# Patient Record
Sex: Female | Born: 1945 | Race: White | Hispanic: No | Smoking: Never smoker
Health system: Southern US, Community
[De-identification: ages and names within clinical notes are randomized; demographics above are authoritative.]

## PROBLEM LIST (undated history)

## (undated) DIAGNOSIS — R9389 Abnormal findings on diagnostic imaging of other specified body structures: Secondary | ICD-10-CM

## (undated) DIAGNOSIS — Z86018 Personal history of other benign neoplasm: Secondary | ICD-10-CM

## (undated) DIAGNOSIS — Z8719 Personal history of other diseases of the digestive system: Secondary | ICD-10-CM

## (undated) DIAGNOSIS — D0510 Intraductal carcinoma in situ of unspecified breast: Secondary | ICD-10-CM

## (undated) DIAGNOSIS — M810 Age-related osteoporosis without current pathological fracture: Secondary | ICD-10-CM

## (undated) DIAGNOSIS — C221 Intrahepatic bile duct carcinoma: Secondary | ICD-10-CM

## (undated) DIAGNOSIS — N189 Chronic kidney disease, unspecified: Secondary | ICD-10-CM

## (undated) DIAGNOSIS — K2270 Barrett's esophagus without dysplasia: Secondary | ICD-10-CM

## (undated) HISTORY — PX: DIALYSIS/PERMA CATHETER REPAIR: CATH118293

## (undated) HISTORY — DX: Personal history of other benign neoplasm: Z86.018

## (undated) HISTORY — DX: Intraductal carcinoma in situ of unspecified breast: D05.10

## (undated) HISTORY — DX: Age-related osteoporosis without current pathological fracture: M81.0

## (undated) HISTORY — PX: MASTECTOMY PARTIAL / LUMPECTOMY: SUR851

## (undated) HISTORY — DX: Chronic kidney disease, unspecified: N18.9

## (undated) HISTORY — DX: Abnormal findings on diagnostic imaging of other specified body structures: R93.89

## (undated) HISTORY — DX: Barrett's esophagus without dysplasia: K22.70

## (undated) HISTORY — PX: LAPAROSCOPIC PARTIAL HEPATECTOMY: SHX5909

## (undated) HISTORY — DX: Personal history of other diseases of the digestive system: Z87.19

## (undated) HISTORY — PX: CHOLECYSTECTOMY: SHX55

## (undated) HISTORY — DX: Intrahepatic bile duct carcinoma: C22.1

## (undated) NOTE — *Deleted (*Deleted)
[facility]  [address], [address] [place], [place] [address] Phone: [phone]  Fax: [phone]   Clinic Day:  06/15/2020  Referring physician: Garald Balding*  Chief Complaint: Connie Olsen is a 71 y.o. female with a history of stage II gallbladder cancer, left breast DCIS, and end-stage renal disease on dialysis who is seen for 1 year assessment.  HPI:  The patient was last seen in the hematology clinic on 06/16/2019 for new patient assessment. At that time, she had "good and bad days".  She noted a 30 pound unintentional weight loss over the past 1.5 years. She was eating less.  She had off and on nausea and "some" emesis.  She had alternating constipation and diarrhea which she attributed to IBS. She continued anastrozole. She preferred to be followed by [facility] oncology and only have her port flushed in [place].  Chest, abdomen, and pelvis CT with contrast at [facility] on 06/30/2019 revealed no evidence of metastatic disease in the chest, abdomen, or pelvis. There was similar appearance of low-attenuation in the left hepatic lobe favoring focal fatty infiltration. The aortocaval lymph node was less conspicuous on this study.   The patient saw Dr. Harden Mo on 12/31/2019 for a follow up regarding her history of DCIS. Bilateral diagnostic mammogram on 12/31/2019 revealed no evidence of malignancy. She continued anastrozole and will hit the 5 year mark in 08/2020. Follow up was scheduled for 08/2020.  Bone density on 03/22/2020 revealed osteoporosis with a T score of -2.7 at the right femur neck.  The patient saw Dr. Mariah Milling, on 06/07/2020 for a follow up regarding her history of gallbladder cancer. She had no new GI complaints. Chest, abdomen, and pelvis CT with contrast on 06/07/2020 revealed no metastatic disease. Follow up was planned for 1 year.  CA19-9 was 140 on 12/30/2018 and 167 on 06/07/2020.  CEA was 8.4 on 06/30/2019, 6.1 on 12/01/2019, and  7.5 on 06/07/2020.  During the interim, ***   Past Medical History:  Diagnosis Date  . Barrett's esophagus without dysplasia   . Cholangiocarcinoma ([place])   . Chronic kidney disease   . 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 Endometrial thickening on ultra sound 04/19/2015  . History of ulcerative colitis   . History of uterine fibroid   . Osteoporosis     Past Surgical History:  Procedure Laterality Date  . CHOLECYSTECTOMY    . colonoscopy  05/08/2016  . LAPAROSCOPIC PARTIAL HEPATECTOMY    . MASTECTOMY PARTIAL / LUMPECTOMY    . UPPER GI ENDOSCOPY  05/08/2016    Family History  Problem Relation Age of Onset  . Breast cancer Mother     Social History:  reports that she has never smoked. She has never used smokeless tobacco. She reports that she does not drink alcohol and does not use drugs. She is from [place]. She has had multiple jobs. Her husband worked in [employer] so they moved around a lot.  She lives in [place].  The patient is alone*** today.  Allergies:  Allergies  Allergen Reactions  . Aspirin Other (See Comments)    Ulcerative colitis: UNABLE TO TOLERATE IF NOT ENTERIC COATED Ulcerative colitis: UNABLE TO TOLERATE IF NOT ENTERIC COATED  . Brassica Napus Seed Oil Other (See Comments)  . Naltrexone Other (See Comments)  . Librax  [Chlordiazepoxide-Clidinium] Anxiety    agitation agitation  . Povidone Iodine Itching and Rash  . Povidone-Iodine Itching and Rash  . Tape Itching  Current Medications: Current Outpatient Medications  Medication Sig Dispense Refill  . acetaminophen (TYLENOL) 500 MG tablet Take 500 mg by mouth every 6 (six) hours as needed for moderate pain.     Marland Kitchen amitriptyline (ELAVIL) 10 MG tablet Take 10 mg by mouth at bedtime.     Marland Kitchen amoxicillin (AMOXIL) 500 MG tablet TAKE 4 TABLETS BY MOUTH ONCE FOR 1 DOSE. TAKE 30 60 MINUTES PRIOR TO DENTAL PROCEDURE    . anastrozole (ARIMIDEX) 1 MG tablet Take 1 mg by mouth daily.    Marland Kitchen  aspirin EC 81 MG tablet Take 81 mg by mouth daily.     . B Complex-C-Folic Acid (DIALYVITE TABLET) TABS Take 1 tablet by mouth daily.     . Baclofen 5 MG TABS Take 2.5 mg by mouth 2 (two) times daily as needed (Spasm, back pain). 10 tablet 0  . Bismuth Subsalicylate [id number] SUSP Take 15 mLs by mouth daily. As directed as needed    . Darbepoetin Alfa (ARANESP) 100 MCG/0.5ML SOSY injection Inject 100 mcg into the vein every 7 (seven) days.     . diazepam (VALIUM) 10 MG tablet TAKE 1 TABLET BY MOUTH 1 HOUR PRIOR TO DENTAL TREATMENT    . ferric citrate (AURYXIA) 1 GM 210 MG(Fe) tablet Take 420 mg by mouth 3 (three) times daily.     . folic acid-vitamin b complex-vitamin c-selenium-zinc (DIALYVITE) 3 MG TABS tablet Take 1 tablet by mouth daily.    . Heparin Lock Flush (HEPARIN FLUSH, PORCINE,) 100 UNIT/ML injection 100U/ml Flush port with 42ml every 30 days.    Marland Kitchen lidocaine-prilocaine (EMLA) cream Apply to access before dialysis    . metoprolol tartrate (LOPRESSOR) 25 MG tablet 12.5 mg 2 (two) times daily.   1  . midodrine (PROAMATINE) 10 MG tablet Take by mouth.    Marland Kitchen omeprazole (PRILOSEC) 20 MG capsule TAKE ONE CAPSULE BY MOUTH EVERY DAY 30-45 MINUTES BEFORE BREAKFAST  8  . SENSIPAR 60 MG tablet TAKE 1 TABLET BY MOUTH ONCE A DAY WITH MEALS  11  . sodium chloride 0.9 % injection 17ml of NS flush into port once every 30 days. Follow with heparin flush    . traMADol (ULTRAM) 50 MG tablet Take 1 tablet (50 mg total) by mouth every 12 (twelve) hours as needed. 10 tablet 0   No current facility-administered medications for this visit.    Review of Systems  Constitutional: Positive for weight loss (unintentional 30 pounds over 1.5 years;  baseline 168-170 pounds). Negative for chills, diaphoresis, fever and malaise/fatigue.       She describes "good and bad days".  HENT: Negative for congestion, ear pain, hearing loss, nosebleeds, sinus pain and sore throat.   Eyes: Negative for blurred vision and  double vision.  Respiratory: Positive for cough (constant) and sputum production (phlegm). Negative for hemoptysis and shortness of breath.   Cardiovascular: Positive for chest pain (off and on). Negative for palpitations and leg swelling.  Gastrointestinal: Positive for diarrhea (on and off), nausea (on and off) and vomiting. Negative for abdominal pain, blood in stool, constipation (on and off) and melena.       Eating less. IBS; ulcerative colitis. Barrett's esophagus. Dysphagia on surveillance program.  Genitourinary: Negative for dysuria, hematuria and urgency.       Began dialysis 11/2012.  Musculoskeletal: Positive for back pain (spasms) and joint pain (bilateral knees, hands; arthritis). Negative for myalgias and neck pain (herniated disc).  Skin: Negative for itching and rash.  Neurological:  Positive for sensory change (numbness in fingers and hands) and headaches (off and on). Negative for dizziness, tingling, speech change, focal weakness and weakness.       Ambulate by cane or walker.  Endo/Heme/Allergies: Does not bruise/bleed easily.       Type II diabetes.  Psychiatric/Behavioral: Negative.  Negative for depression and memory loss. The patient is not nervous/anxious and does not have insomnia.   All other systems reviewed and are negative.  Performance status (ECOG): 2***  Vitals There were no vitals taken for this visit.   Physical Exam Vitals and nursing note reviewed.  Constitutional:      General: She is not in acute distress.    Appearance: She is well-developed. She is not diaphoretic.     Comments: Patient needs assistance on and off exam room table.  She has a rolling walker by her side.  HENT:     Head: Normocephalic and atraumatic.     Mouth/Throat:     Pharynx: No oropharyngeal exudate.  Eyes:     General: No scleral icterus.    Conjunctiva/sclera: Conjunctivae normal.     Pupils: Pupils are equal, round, and reactive to light.     Comments: Blue eyes.   Neck:     Vascular: No JVD.  Cardiovascular:     Rate and Rhythm: Normal rate and regular rhythm.     Heart sounds: Normal heart sounds. No murmur heard.   Pulmonary:     Effort: Pulmonary effort is normal. No respiratory distress.     Breath sounds: Normal breath sounds. No wheezing or rales.  Chest:     Chest wall: No tenderness.  Abdominal:     General: Bowel sounds are normal. There is no distension.     Palpations: Abdomen is soft. There is no mass.     Tenderness: There is abdominal tenderness. There is no guarding or rebound.  Musculoskeletal:        General: Normal range of motion.     Cervical back: Normal range of motion and neck supple.     Lumbar back: Tenderness (paravertebral) present.  Lymphadenopathy:     Cervical: No cervical adenopathy.     Upper Body:     Right upper body: No supraclavicular adenopathy.     Left upper body: No supraclavicular adenopathy.     Lower Body: No right inguinal adenopathy. No left inguinal adenopathy.  Skin:    General: Skin is warm and dry.     Coloration: Skin is not pale.     Findings: No rash.  Neurological:     Mental Status: She is alert and oriented to person, place, and time.  Psychiatric:        Behavior: Behavior normal.        Thought Content: Thought content normal.        Judgment: Judgment normal.     No visits with results within 3 Day(s) from this visit.  Latest known visit with results is:  Office Visit on 11/14/2017  Component Date Value Ref Range Status  . Atopobium vaginae 11/14/2017 Low - 0  Score Final  . BVAB 2 11/14/2017 Low - 0  Score Final  . Megasphaera 1 11/14/2017 Low - 0  Score Final   Comment: Calculate total score by adding the 3 individual bacterial vaginosis (BV) marker scores together.  Total score is interpreted as follows: Total score 0-1: Indicates the absence of BV. Total score   2: Indeterminate for BV. Additional clinical  data should be evaluated to establish a                   diagnosis. Total score 3-6: Indicates the presence of BV. This test was developed and its performance characteristics determined by [facility].  It has not been cleared or approved by the Food and Drug Administration.  The FDA has determined that such clearance or approval is not necessary.   . Candida albicans, NAA 11/14/2017 Negative  Negative Final  . Candida glabrata, NAA 11/14/2017 Negative  Negative Final   Comment: This test was developed and its performance characteristics determined by [facility].  It has not been cleared or approved by the Food and Drug Administration.  The FDA has determined that such clearance or approval is not necessary.   Dalbert Batman vag by NAA 11/14/2017 Negative  Negative Final    Assessment:  Connie Olsen is a 74 y.o. female with a history of stage II gallbladder cancer, left breast DCIS, and ESRD on dialysis.   She has a history of gallbladder cancer s/p laparoscopic cholecystectomy and liver wedge biopsy by Dr Mariah Milling at [facility] on 04/15/2014.  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  Tumor invaded perimuscular connective tissue with no extension beyond the serosa or into the liver.  Margins were uninvolved (closest 0.5 mm).  There is no lymphovascular or perineural invasion.  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  One lymph node was negative.  Pathologic stage was pT2 pN0.   Abdomen and pelvis CT on 12/30/2018 showed no definite metastatic disease within the chest, abdomen, or pelvis. There was minimally increased conspicuity of a hypodense left hepatic lesion, favor benign in etiology such as focal fat given relative stability since 2017. Attention on follow up imaging was recommended.  There was no significant change in the mildy prominent aortocaval lymph node. Chest, abdomen, and pelvis CT with contrast at [facility] on 06/30/2019  revealed no evidence of metastatic disease. There was similar appearance of low-attenuation in the left hepatic lobe favoring focal fatty infiltration. The aortocaval lymph node was less conspicuous.  Chest, abdomen, and pelvis CT with contrast at [facility] on 06/07/2020 revealed no metastatic disease.  CA19-9 has been followed: 87 on 10/15/2014, 165 on 01/21/2015, 92 on 03/22/2015, 131 on 09/20/2015, 168 on 03/20/2016, 89 on 09/18/2016, 143 on 09/24/2017, 495 on 09/16/2018, 140 on 12/30/2018, and 167 on 06/07/2020.  CEA was 5.5 on 10/15/2014, 4.7 on 01/21/2015, 5.5 on 03/22/2015, 8.4 on 06/30/2019, 6.1 on 12/01/2019, and 7.5 on 06/07/2020.  She has a history of DCIS left breast (11/2014) s/p multiple excisions with + margins.  DCIS was ER and PR positive.  She declined further surgery or radiation.  She remains on Armidex.  She is followed by Dr. Deitra Mayo at [facility].  Bilateral mammogram on 08/12/2018 showed no mammographic evidence of reccurrent malignancy. Bilateral diagnositc mammogram on 12/31/2019 revealed no evidence of malignancy.  She is on dialysis 3 times a week.   She has had recurrent episodes of AV-fistula/graft thrombosis.  She has been seen in the hematology clinic by Dr. Dyann Kief.   Thrombophilia work-up was not recommended.  Anticardiolipin antibody IgM was > 150 on 11/28/2016 and 06/06/2017.  Beta-2 glycoprotein was > 150 on 11/28/2016 and 06/06/2017.  Lupus anticoagulant panel was + on 12/24/2016 and 06/06/2017.   Bone density on 11/11/2017 revealed osteopenia with a T score of -1.40 at the right  neck femur and osteoporosis with a T-score of -3.3 in the left radius. Bone density on 03/22/2020 revealed osteoporosis with a T score of -2.7 at the right femur neck.  She is not a candidate for Zometa given her ESRD.  She has IBS.  Last colonoscopy was on 05/08/2016.  She has Barrett's esophagus. Last EGD was on 05/23/2017.  Echo on 03/27/2019 revealed ejection fraction > 55%.   The patient  received the COVID-19 vaccine.  Symptomatically, ***  Plan: 1.   Review labs   2.   Stage II gallbladder cancer   Review diagnosis and pathology.  Review interim scans.  Patient follows with Dr Mariah Milling at [facility]. 3.   Left breast DCIS  Review initial diagnosis and multiple surgeries with positive margins.  Patient declined radiation.  Bilateral mammogram on 08/12/2018 revealed no recurrence.   She continues Arimidex.  She is followed by Dr Deitra Mayo at [facility]. 4.   Recurrent AV-fistula/graft thrombosis  Patient with + lupus anticoagulant, anti-cardiolipin antibodies, beta-2 glycoprotein antibodies.  Continue aspirin.  Discuss follow-up with Dr. Dyann Kief. 5.   Port-a cath maintenance  Patient wishes only to have port maintenance in this clinic. 6.   RTC every 6 weeks x 1 year for port flush 7.   RTC in 1 year for MD assessment, +/- labs, and port flush.  I discussed the assessment and treatment plan with the patient.  The patient was provided an opportunity to ask questions and all were answered.  The patient agreed with the plan and demonstrated an understanding of the instructions.  The patient was advised to call back if the symptoms worsen or if the condition fails to improve as anticipated.  I provided *** minutes of face-to-face time during this this encounter and > 50% was spent counseling as documented under my assessment and plan.  Omaree Fuqua C. Mike Gip, MD, PhD    06/15/2020, 2:18 PM  I, Mirian Mo Tufford, am acting as Education administrator for [employer]. Mike Gip, MD, PhD.  I, Arlen Dupuis C. Mike Gip, MD, have reviewed the above documentation for accuracy and completeness, and I agree with the above.

---

## 2014-02-04 DIAGNOSIS — I4891 Unspecified atrial fibrillation: Secondary | ICD-10-CM | POA: Insufficient documentation

## 2014-02-04 DIAGNOSIS — N186 End stage renal disease: Secondary | ICD-10-CM | POA: Insufficient documentation

## 2014-02-09 DIAGNOSIS — C23 Malignant neoplasm of gallbladder: Secondary | ICD-10-CM | POA: Insufficient documentation

## 2014-11-02 DIAGNOSIS — D0510 Intraductal carcinoma in situ of unspecified breast: Secondary | ICD-10-CM

## 2014-11-02 HISTORY — DX: Intraductal carcinoma in situ of unspecified breast: D05.10

## 2014-11-16 DIAGNOSIS — I10 Essential (primary) hypertension: Secondary | ICD-10-CM | POA: Insufficient documentation

## 2014-11-16 DIAGNOSIS — Z87898 Personal history of other specified conditions: Secondary | ICD-10-CM | POA: Insufficient documentation

## 2014-11-16 DIAGNOSIS — E669 Obesity, unspecified: Secondary | ICD-10-CM | POA: Insufficient documentation

## 2014-11-16 DIAGNOSIS — Z4802 Encounter for removal of sutures: Secondary | ICD-10-CM | POA: Insufficient documentation

## 2015-02-12 DIAGNOSIS — D509 Iron deficiency anemia, unspecified: Secondary | ICD-10-CM | POA: Insufficient documentation

## 2015-02-12 DIAGNOSIS — D631 Anemia in chronic kidney disease: Secondary | ICD-10-CM | POA: Insufficient documentation

## 2015-02-12 DIAGNOSIS — E1129 Type 2 diabetes mellitus with other diabetic kidney complication: Secondary | ICD-10-CM | POA: Insufficient documentation

## 2015-02-12 DIAGNOSIS — D689 Coagulation defect, unspecified: Secondary | ICD-10-CM | POA: Insufficient documentation

## 2015-02-12 DIAGNOSIS — R188 Other ascites: Secondary | ICD-10-CM | POA: Insufficient documentation

## 2015-02-12 DIAGNOSIS — N2581 Secondary hyperparathyroidism of renal origin: Secondary | ICD-10-CM | POA: Insufficient documentation

## 2015-02-12 DIAGNOSIS — E876 Hypokalemia: Secondary | ICD-10-CM | POA: Insufficient documentation

## 2015-02-12 DIAGNOSIS — R7881 Bacteremia: Secondary | ICD-10-CM | POA: Insufficient documentation

## 2015-03-04 DIAGNOSIS — I878 Other specified disorders of veins: Secondary | ICD-10-CM | POA: Insufficient documentation

## 2015-03-08 DIAGNOSIS — Z8742 Personal history of other diseases of the female genital tract: Secondary | ICD-10-CM | POA: Insufficient documentation

## 2015-03-08 DIAGNOSIS — Z8719 Personal history of other diseases of the digestive system: Secondary | ICD-10-CM | POA: Insufficient documentation

## 2015-03-31 ENCOUNTER — Other Ambulatory Visit: Payer: Self-pay | Admitting: Family Medicine

## 2015-03-31 DIAGNOSIS — N859 Noninflammatory disorder of uterus, unspecified: Secondary | ICD-10-CM

## 2015-03-31 DIAGNOSIS — N950 Postmenopausal bleeding: Secondary | ICD-10-CM

## 2015-03-31 DIAGNOSIS — N949 Unspecified condition associated with female genital organs and menstrual cycle: Secondary | ICD-10-CM

## 2015-04-04 DIAGNOSIS — R1084 Generalized abdominal pain: Secondary | ICD-10-CM | POA: Insufficient documentation

## 2015-04-05 ENCOUNTER — Ambulatory Visit
Admission: RE | Admit: 2015-04-05 | Discharge: 2015-04-05 | Disposition: A | Discharge status: Home / Self Care | Payer: Medicare Other | Source: Ambulatory Visit | Attending: Family Medicine | Admitting: Family Medicine

## 2015-04-05 DIAGNOSIS — N949 Unspecified condition associated with female genital organs and menstrual cycle: Secondary | ICD-10-CM | POA: Insufficient documentation

## 2015-04-05 DIAGNOSIS — D259 Leiomyoma of uterus, unspecified: Secondary | ICD-10-CM | POA: Diagnosis not present

## 2015-04-05 DIAGNOSIS — N950 Postmenopausal bleeding: Secondary | ICD-10-CM

## 2015-04-05 DIAGNOSIS — R938 Abnormal findings on diagnostic imaging of other specified body structures: Secondary | ICD-10-CM | POA: Diagnosis not present

## 2015-04-05 DIAGNOSIS — N859 Noninflammatory disorder of uterus, unspecified: Secondary | ICD-10-CM | POA: Diagnosis present

## 2015-04-19 ENCOUNTER — Inpatient Hospital Stay: Payer: Medicare Other | Attending: Family Medicine

## 2015-04-19 ENCOUNTER — Inpatient Hospital Stay: Payer: Medicare Other | Attending: Family Medicine | Admitting: Family Medicine

## 2015-04-19 ENCOUNTER — Encounter: Payer: Self-pay | Admitting: Family Medicine

## 2015-04-19 ENCOUNTER — Other Ambulatory Visit: Payer: Self-pay | Admitting: Family Medicine

## 2015-04-19 VITALS — BP 91/54 | HR 82 | Temp 98.0°F | Resp 16 | Ht 61.02 in | Wt 204.6 lb

## 2015-04-19 DIAGNOSIS — Z8509 Personal history of malignant neoplasm of other digestive organs: Secondary | ICD-10-CM

## 2015-04-19 DIAGNOSIS — Z992 Dependence on renal dialysis: Secondary | ICD-10-CM | POA: Insufficient documentation

## 2015-04-19 DIAGNOSIS — N950 Postmenopausal bleeding: Secondary | ICD-10-CM | POA: Diagnosis not present

## 2015-04-19 DIAGNOSIS — I4891 Unspecified atrial fibrillation: Secondary | ICD-10-CM | POA: Diagnosis not present

## 2015-04-19 DIAGNOSIS — R5383 Other fatigue: Secondary | ICD-10-CM | POA: Diagnosis not present

## 2015-04-19 DIAGNOSIS — R531 Weakness: Secondary | ICD-10-CM | POA: Diagnosis not present

## 2015-04-19 DIAGNOSIS — I872 Venous insufficiency (chronic) (peripheral): Secondary | ICD-10-CM

## 2015-04-19 DIAGNOSIS — Z7982 Long term (current) use of aspirin: Secondary | ICD-10-CM

## 2015-04-19 DIAGNOSIS — Z9221 Personal history of antineoplastic chemotherapy: Secondary | ICD-10-CM

## 2015-04-19 DIAGNOSIS — N186 End stage renal disease: Secondary | ICD-10-CM | POA: Diagnosis not present

## 2015-04-19 DIAGNOSIS — R938 Abnormal findings on diagnostic imaging of other specified body structures: Secondary | ICD-10-CM | POA: Diagnosis not present

## 2015-04-19 DIAGNOSIS — Z452 Encounter for adjustment and management of vascular access device: Secondary | ICD-10-CM | POA: Diagnosis not present

## 2015-04-19 DIAGNOSIS — Z79899 Other long term (current) drug therapy: Secondary | ICD-10-CM | POA: Diagnosis not present

## 2015-04-19 DIAGNOSIS — D0512 Intraductal carcinoma in situ of left breast: Secondary | ICD-10-CM

## 2015-04-19 DIAGNOSIS — R9389 Abnormal findings on diagnostic imaging of other specified body structures: Secondary | ICD-10-CM

## 2015-04-19 DIAGNOSIS — Z9049 Acquired absence of other specified parts of digestive tract: Secondary | ICD-10-CM | POA: Diagnosis not present

## 2015-04-19 HISTORY — DX: Abnormal findings on diagnostic imaging of other specified body structures: R93.89

## 2015-04-19 MED ORDER — SODIUM CHLORIDE 0.9 % IJ SOLN
10.0000 mL | INTRAMUSCULAR | Status: DC | PRN
  Administered 2015-04-19: 10 mL via INTRAVENOUS
  Filled 2015-04-19: qty 10

## 2015-04-19 MED ORDER — HEPARIN SOD (PORK) LOCK FLUSH 100 UNIT/ML IV SOLN
500.0000 [IU] | Freq: Once | INTRAVENOUS | Status: DC
  Filled 2015-04-19: qty 5

## 2015-04-19 NOTE — Progress Notes (Signed)
Connie Olsen  [phone]  Fax:([phone]) [place] DOB: March 03, 1946  MR#: [id number]  [id number]  Patient Care Team: Gearldine Shown, DO as PCP - General (Family Medicine)  CHIEF COMPLAINT:  Chief Complaint  Patient presents with  . Follow-up    Pt is here for power port maintenance...   She has a significant medical history including DCIS of the left breast, cholangiocarcinoma, and most recently endometrial thickening.  Patient also was significant history of end-stage renal disease on hemodialysis  Monday Wednesday and Friday, atrial fibrillation , poor venous access.  INTERVAL HISTORY:  Patient is here to establish care for regular port maintenance at your local facility.  Patient was originally diagnosed with cholangiocarcinoma approximately July 2015. She underwent a laparoscopic cholecystectomy 04/15/2014 followed by a radical cholecystectomy with portal LAD and partial hepatectomy in September 2015.  She follows with Dr. Mariah Milling. Initial pathologies were thought to be negative for malignancy however final pathology demonstrated that T2 N0 well differentiated adenocarcinoma with negative margins. Patient was also recently diagnosed with DCIS of the left breast and is being followed by Dr. Cena Benton. Area of the left breast has had excision 2 with recurrence. She is due for a third surgical excision with Dr. Cena Benton  In the near future. She was also seen by her primary care provider for postmenopausal vaginal bleeding. Ultrasounds have been performed and patient was noted to have endometrial thickening. She has been referred to [place]. She is here today to establish care for her port maintenance as it is difficult for her to travel to  [facility] as frequently as needed for Port-A-Cath maintenance.  REVIEW OF SYSTEMS:   Review of Systems  Constitutional: Positive for malaise/fatigue. Negative for fever, chills, weight loss and  diaphoresis.  HENT: Negative for congestion, ear discharge, ear pain, hearing loss, nosebleeds, sore throat and tinnitus.   Eyes: Negative for blurred vision, double vision, photophobia, pain, discharge and redness.  Respiratory: Negative for cough, hemoptysis, sputum production, shortness of breath, wheezing and stridor.   Cardiovascular: Negative for chest pain, palpitations, orthopnea, claudication, leg swelling and PND.  Gastrointestinal: Negative for heartburn, nausea, vomiting, abdominal pain, diarrhea, constipation, blood in stool and melena.  Genitourinary: Negative.   Musculoskeletal: Positive for joint pain.  Skin: Negative.   Neurological: Negative for dizziness, tingling, focal weakness, seizures, weakness and headaches.  Endo/Heme/Allergies: Does not bruise/bleed easily.  Psychiatric/Behavioral: Negative for depression. The patient is not nervous/anxious and does not have insomnia.     As per HPI. Otherwise, a complete review of systems is negatve.  ONCOLOGY HISTORY: Oncology History   Laparoscopic cholecystectomy, 04/15/14; Radical cholecystectomy with portal LAD, segment 4B/5 partial hepatectomy, 05/27/14.  found to have a gallbladder mass on CT that was followed by an MRI that raised concern for gallbladder cancer as well as a bile duct stricture followed by EUS and ERCP that revealed no further pathology. She was taken to the OR 8/13 for a lap chole with the intent that should frozen pathology demonstrate concern for cancer, a more radical resection would be done. However, at the time of surgery, though frozen section was negative for cancer, final pathology demonstrated a T2N0 well differentiated adenocarcinoma, margins negative.     Cancer of gallbladder   02/09/2014 Initial Diagnosis Cancer of gallbladder   11/02/2014 Cancer Diagnosis DCIS L breast and is being followed by Dr. Cena Benton     PAST MEDICAL HISTORY: Past Medical History  Diagnosis Date  .  DCIS (ductal carcinoma  in situ) of breast 11/02/2014  . Endometrial thickening on ultra sound 04/19/2015    PAST SURGICAL HISTORY: Past Surgical History  Procedure Laterality Date  . Cholecystectomy    . Laparoscopic partial hepatectomy    . Mastectomy partial / lumpectomy      FAMILY HISTORY No family history on file.  GYNECOLOGIC HISTORY:  No LMP recorded.     ADVANCED DIRECTIVES:    HEALTH MAINTENANCE: Social History  Substance Use Topics  . Smoking status: Never Smoker   . Smokeless tobacco: None  . Alcohol Use: No     Colonoscopy:  PAP:  Bone density:  Lipid panel:  Allergies  Allergen Reactions  . Aspirin Other (See Comments)    Ulcerative colitis: UNABLE TO TOLERATE IF NOT ENTERIC COATED  . Tape Itching  . Librax  [Chlordiazepoxide-Clidinium] Anxiety    agitation  . Povidone Iodine Itching and Rash    Current Outpatient Prescriptions  Medication Sig Dispense Refill  . acetaminophen (TYLENOL) 500 MG tablet     . aspirin EC 81 MG tablet Take by mouth.    . Darbepoetin Alfa (ARANESP, ALBUMIN FREE,) 100 MCG/0.5ML SOSY injection Inject into the vein.    . Heparin Lock Flush (HEPARIN FLUSH, PORCINE,) 100 UNIT/ML injection 100U/ml Flush port with 66ml every 30 days.    Marland Kitchen lidocaine-prilocaine (EMLA) cream Apply to access before dialysis    . metoprolol tartrate (LOPRESSOR) 25 MG tablet Take 6.25 mg by mouth.    . oxyCODONE-acetaminophen (PERCOCET/ROXICET) 5-325 MG per tablet Take by mouth.    . SENSIPAR 60 MG tablet TAKE 1 TABLET BY MOUTH ONCE A DAY WITH MEALS  11  . sodium chloride 0.9 % injection 43ml of NS flush into port once every 30 days. Follow with heparin flush     No current facility-administered medications for this visit.    OBJECTIVE: BP 91/54 mmHg  Pulse 82  Temp(Src) 98 F (36.7 C) (Tympanic)  Resp 16  Ht 5' 1.02" (1.55 m)  Wt 204 lb 9.4 oz (92.8 kg)  BMI 38.63 kg/m2  SpO2 98%   Body mass index is 38.63 kg/(m^2).    ECOG FS:1 - Symptomatic but completely  ambulatory  General: Well-developed, well-nourished, no acute distress. Eyes: Pink conjunctiva, anicteric sclera. HEENT: Normocephalic, moist mucous membranes, clear oropharnyx. Lungs: Clear to auscultation bilaterally. Heart: Regular rate and rhythm. No rubs, murmurs, or gallops. Abdomen: Soft, nontender, nondistended. No organomegaly noted, normoactive bowel sounds. Musculoskeletal: No edema, cyanosis, or clubbing. Neuro: Alert, answering all questions appropriately. Cranial nerves grossly intact. Skin: No rashes or petechiae noted. Psych: Normal affect.   LAB RESULTS:  No results found for any previous visit.  STUDIES: No results found.  ASSESSMENT:   Port-A-Cath maintenance due to poor venous access  PLAN:    1. Port-A-Cath maintenance. Patient will be scheduled for PAC flushes every 6-8 weeks. She has been instructed that  Any clinical questions regarding her cancer diagnoses, treatments, side effects, or signs of  recurrence should be directed to her primary oncologist.  Patient expressed understanding and was in agreement with this plan. She also understands that She can call clinic at any time with any questions, concerns, or complaints.   Dr. Oliva Bustard was available for consultation and review of plan of care for this patient.  Cancer of gallbladder   Staging form: Gallbladder, AJCC 7th Edition     Clinical: Stage II (T2, N0, M0) - Signed by Evlyn Kanner, NP on 04/19/2015  Evlyn Kanner, NP   04/19/2015 1:32 PM

## 2015-05-26 ENCOUNTER — Inpatient Hospital Stay: Payer: Medicare Other | Attending: Family Medicine

## 2015-05-26 VITALS — BP 81/60 | HR 100 | Temp 98.9°F | Resp 18

## 2015-05-26 DIAGNOSIS — Z95828 Presence of other vascular implants and grafts: Secondary | ICD-10-CM

## 2015-05-26 DIAGNOSIS — Z8509 Personal history of malignant neoplasm of other digestive organs: Secondary | ICD-10-CM | POA: Diagnosis not present

## 2015-05-26 DIAGNOSIS — D0512 Intraductal carcinoma in situ of left breast: Secondary | ICD-10-CM | POA: Insufficient documentation

## 2015-05-26 DIAGNOSIS — Z452 Encounter for adjustment and management of vascular access device: Secondary | ICD-10-CM | POA: Diagnosis not present

## 2015-05-26 DIAGNOSIS — Z9221 Personal history of antineoplastic chemotherapy: Secondary | ICD-10-CM | POA: Diagnosis not present

## 2015-05-26 MED ORDER — SODIUM CHLORIDE 0.9 % IJ SOLN
10.0000 mL | INTRAMUSCULAR | Status: DC | PRN
  Administered 2015-05-26: 10 mL via INTRAVENOUS
  Filled 2015-05-26: qty 10

## 2015-05-26 MED ORDER — HEPARIN SOD (PORK) LOCK FLUSH 100 UNIT/ML IV SOLN
500.0000 [IU] | Freq: Once | INTRAVENOUS | Status: AC
Start: 2015-05-26 — End: 2015-05-26
  Administered 2015-05-26: 500 [IU] via INTRAVENOUS
  Filled 2015-05-26: qty 5

## 2015-05-31 ENCOUNTER — Inpatient Hospital Stay: Payer: Medicare Other

## 2015-06-16 DIAGNOSIS — Z23 Encounter for immunization: Secondary | ICD-10-CM | POA: Insufficient documentation

## 2015-06-17 DIAGNOSIS — D0512 Intraductal carcinoma in situ of left breast: Secondary | ICD-10-CM | POA: Insufficient documentation

## 2015-06-20 DIAGNOSIS — E441 Mild protein-calorie malnutrition: Secondary | ICD-10-CM | POA: Insufficient documentation

## 2015-07-12 ENCOUNTER — Inpatient Hospital Stay: Payer: Medicare Other

## 2015-07-20 ENCOUNTER — Other Ambulatory Visit: Payer: Self-pay | Admitting: Family Medicine

## 2015-07-20 ENCOUNTER — Inpatient Hospital Stay: Payer: Medicare Other | Attending: Family Medicine

## 2015-07-20 VITALS — BP 90/52 | HR 78 | Temp 97.6°F

## 2015-07-20 DIAGNOSIS — Z452 Encounter for adjustment and management of vascular access device: Secondary | ICD-10-CM | POA: Insufficient documentation

## 2015-07-20 DIAGNOSIS — Z8509 Personal history of malignant neoplasm of other digestive organs: Secondary | ICD-10-CM | POA: Insufficient documentation

## 2015-07-20 DIAGNOSIS — Z9221 Personal history of antineoplastic chemotherapy: Secondary | ICD-10-CM | POA: Insufficient documentation

## 2015-07-20 DIAGNOSIS — D0512 Intraductal carcinoma in situ of left breast: Secondary | ICD-10-CM | POA: Insufficient documentation

## 2015-07-20 DIAGNOSIS — Z95828 Presence of other vascular implants and grafts: Secondary | ICD-10-CM

## 2015-07-20 MED ORDER — HEPARIN SOD (PORK) LOCK FLUSH 100 UNIT/ML IV SOLN
500.0000 [IU] | Freq: Once | INTRAVENOUS | Status: AC
  Administered 2015-07-20: 500 [IU] via INTRAVENOUS
  Filled 2015-07-20: qty 5

## 2015-07-20 MED ORDER — SODIUM CHLORIDE 0.9 % IJ SOLN
10.0000 mL | INTRAMUSCULAR | Status: DC | PRN
  Administered 2015-07-20: 10 mL via INTRAVENOUS
  Filled 2015-07-20: qty 10

## 2015-08-23 ENCOUNTER — Inpatient Hospital Stay: Payer: Medicare Other

## 2015-08-30 ENCOUNTER — Inpatient Hospital Stay: Payer: Medicare Other | Attending: Family Medicine

## 2015-08-30 DIAGNOSIS — D0512 Intraductal carcinoma in situ of left breast: Secondary | ICD-10-CM | POA: Insufficient documentation

## 2015-08-30 DIAGNOSIS — Z8509 Personal history of malignant neoplasm of other digestive organs: Secondary | ICD-10-CM | POA: Insufficient documentation

## 2015-08-30 DIAGNOSIS — Z9221 Personal history of antineoplastic chemotherapy: Secondary | ICD-10-CM | POA: Insufficient documentation

## 2015-08-30 DIAGNOSIS — Z95828 Presence of other vascular implants and grafts: Secondary | ICD-10-CM

## 2015-08-30 DIAGNOSIS — Z452 Encounter for adjustment and management of vascular access device: Secondary | ICD-10-CM | POA: Diagnosis not present

## 2015-08-30 MED ORDER — SODIUM CHLORIDE 0.9 % IJ SOLN
10.0000 mL | INTRAMUSCULAR | Status: DC | PRN
  Administered 2015-08-30: 10 mL via INTRAVENOUS
  Filled 2015-08-30: qty 10

## 2015-08-30 MED ORDER — HEPARIN SOD (PORK) LOCK FLUSH 100 UNIT/ML IV SOLN
INTRAVENOUS | Status: AC
  Filled 2015-08-30: qty 5

## 2015-08-30 MED ORDER — HEPARIN SOD (PORK) LOCK FLUSH 100 UNIT/ML IV SOLN
500.0000 [IU] | Freq: Once | INTRAVENOUS | Status: AC
  Administered 2015-08-30: 500 [IU] via INTRAVENOUS

## 2015-10-04 ENCOUNTER — Inpatient Hospital Stay: Payer: Medicare Other | Attending: Family Medicine

## 2015-11-01 ENCOUNTER — Inpatient Hospital Stay: Payer: Medicare Other | Attending: Family Medicine

## 2015-11-01 DIAGNOSIS — Z8509 Personal history of malignant neoplasm of other digestive organs: Secondary | ICD-10-CM | POA: Diagnosis not present

## 2015-11-01 DIAGNOSIS — Z452 Encounter for adjustment and management of vascular access device: Secondary | ICD-10-CM | POA: Insufficient documentation

## 2015-11-01 DIAGNOSIS — Z9221 Personal history of antineoplastic chemotherapy: Secondary | ICD-10-CM | POA: Insufficient documentation

## 2015-11-01 DIAGNOSIS — D0512 Intraductal carcinoma in situ of left breast: Secondary | ICD-10-CM | POA: Diagnosis not present

## 2015-11-01 DIAGNOSIS — Z95828 Presence of other vascular implants and grafts: Secondary | ICD-10-CM

## 2015-11-01 MED ORDER — SODIUM CHLORIDE 0.9% FLUSH
10.0000 mL | INTRAVENOUS | Status: DC | PRN
  Administered 2015-11-01: 10 mL via INTRAVENOUS
  Filled 2015-11-01: qty 10

## 2015-11-01 MED ORDER — HEPARIN SOD (PORK) LOCK FLUSH 100 UNIT/ML IV SOLN
500.0000 [IU] | Freq: Once | INTRAVENOUS | Status: AC
  Administered 2015-11-01: 500 [IU] via INTRAVENOUS
  Filled 2015-11-01: qty 5

## 2015-11-15 ENCOUNTER — Inpatient Hospital Stay: Payer: Medicare Other

## 2015-11-30 DIAGNOSIS — M810 Age-related osteoporosis without current pathological fracture: Secondary | ICD-10-CM | POA: Insufficient documentation

## 2015-12-27 ENCOUNTER — Inpatient Hospital Stay: Payer: Medicare Other | Attending: Family Medicine

## 2016-01-02 DIAGNOSIS — S51802A Unspecified open wound of left forearm, initial encounter: Secondary | ICD-10-CM | POA: Insufficient documentation

## 2016-01-17 ENCOUNTER — Inpatient Hospital Stay: Payer: Medicare Other | Attending: Family Medicine

## 2016-01-17 DIAGNOSIS — D0512 Intraductal carcinoma in situ of left breast: Secondary | ICD-10-CM | POA: Insufficient documentation

## 2016-01-17 DIAGNOSIS — Z95828 Presence of other vascular implants and grafts: Secondary | ICD-10-CM

## 2016-01-17 DIAGNOSIS — Z452 Encounter for adjustment and management of vascular access device: Secondary | ICD-10-CM | POA: Insufficient documentation

## 2016-01-17 DIAGNOSIS — Z8509 Personal history of malignant neoplasm of other digestive organs: Secondary | ICD-10-CM | POA: Diagnosis not present

## 2016-01-17 DIAGNOSIS — Z9221 Personal history of antineoplastic chemotherapy: Secondary | ICD-10-CM | POA: Diagnosis not present

## 2016-01-17 MED ORDER — SODIUM CHLORIDE 0.9% FLUSH
10.0000 mL | INTRAVENOUS | Status: DC | PRN
  Administered 2016-01-17: 10 mL via INTRAVENOUS
  Filled 2016-01-17: qty 10

## 2016-01-17 MED ORDER — HEPARIN SOD (PORK) LOCK FLUSH 100 UNIT/ML IV SOLN
500.0000 [IU] | Freq: Once | INTRAVENOUS | Status: AC
  Administered 2016-01-17: 500 [IU] via INTRAVENOUS
  Filled 2016-01-17: qty 5

## 2016-02-07 ENCOUNTER — Inpatient Hospital Stay: Payer: Medicare Other

## 2016-03-20 ENCOUNTER — Inpatient Hospital Stay: Payer: Medicare Other

## 2016-04-10 ENCOUNTER — Inpatient Hospital Stay: Payer: Medicare Other | Attending: Family Medicine

## 2016-04-10 DIAGNOSIS — Z452 Encounter for adjustment and management of vascular access device: Secondary | ICD-10-CM | POA: Insufficient documentation

## 2016-04-10 DIAGNOSIS — D0512 Intraductal carcinoma in situ of left breast: Secondary | ICD-10-CM | POA: Insufficient documentation

## 2016-05-01 ENCOUNTER — Inpatient Hospital Stay: Payer: Medicare Other

## 2016-05-01 VITALS — Resp 18

## 2016-05-01 DIAGNOSIS — D0512 Intraductal carcinoma in situ of left breast: Secondary | ICD-10-CM | POA: Diagnosis present

## 2016-05-01 DIAGNOSIS — Z95828 Presence of other vascular implants and grafts: Secondary | ICD-10-CM

## 2016-05-01 DIAGNOSIS — Z452 Encounter for adjustment and management of vascular access device: Secondary | ICD-10-CM | POA: Diagnosis not present

## 2016-05-01 MED ORDER — HEPARIN SOD (PORK) LOCK FLUSH 100 UNIT/ML IV SOLN
500.0000 [IU] | Freq: Once | INTRAVENOUS | Status: AC
  Administered 2016-05-01: 500 [IU] via INTRAVENOUS
  Filled 2016-05-01: qty 5

## 2016-05-01 MED ORDER — SODIUM CHLORIDE 0.9% FLUSH
10.0000 mL | INTRAVENOUS | Status: DC | PRN
  Administered 2016-05-01: 10 mL via INTRAVENOUS
  Filled 2016-05-01: qty 10

## 2016-05-08 HISTORY — PX: OTHER SURGICAL HISTORY: SHX169

## 2016-05-08 HISTORY — PX: UPPER GI ENDOSCOPY: SHX6162

## 2016-05-16 DIAGNOSIS — Z4901 Encounter for fitting and adjustment of extracorporeal dialysis catheter: Secondary | ICD-10-CM | POA: Insufficient documentation

## 2016-05-16 DIAGNOSIS — K2270 Barrett's esophagus without dysplasia: Secondary | ICD-10-CM | POA: Insufficient documentation

## 2016-05-22 ENCOUNTER — Encounter: Payer: Self-pay | Admitting: Internal Medicine

## 2016-05-22 ENCOUNTER — Inpatient Hospital Stay: Payer: Medicare Other | Attending: Family Medicine | Admitting: Internal Medicine

## 2016-05-22 VITALS — BP 95/43 | HR 73 | Temp 98.3°F | Resp 20 | Ht 61.0 in | Wt 205.0 lb

## 2016-05-22 DIAGNOSIS — M818 Other osteoporosis without current pathological fracture: Secondary | ICD-10-CM | POA: Insufficient documentation

## 2016-05-22 DIAGNOSIS — Z7982 Long term (current) use of aspirin: Secondary | ICD-10-CM

## 2016-05-22 DIAGNOSIS — Z79899 Other long term (current) drug therapy: Secondary | ICD-10-CM | POA: Diagnosis not present

## 2016-05-22 DIAGNOSIS — Z9049 Acquired absence of other specified parts of digestive tract: Secondary | ICD-10-CM

## 2016-05-22 DIAGNOSIS — Z8589 Personal history of malignant neoplasm of other organs and systems: Secondary | ICD-10-CM | POA: Diagnosis not present

## 2016-05-22 DIAGNOSIS — N185 Chronic kidney disease, stage 5: Secondary | ICD-10-CM | POA: Diagnosis not present

## 2016-05-22 DIAGNOSIS — Z8719 Personal history of other diseases of the digestive system: Secondary | ICD-10-CM | POA: Diagnosis not present

## 2016-05-22 DIAGNOSIS — C23 Malignant neoplasm of gallbladder: Secondary | ICD-10-CM

## 2016-05-22 DIAGNOSIS — I878 Other specified disorders of veins: Secondary | ICD-10-CM

## 2016-05-22 DIAGNOSIS — K2270 Barrett's esophagus without dysplasia: Secondary | ICD-10-CM | POA: Diagnosis not present

## 2016-05-22 DIAGNOSIS — Z853 Personal history of malignant neoplasm of breast: Secondary | ICD-10-CM

## 2016-05-22 NOTE — Progress Notes (Signed)
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  found to have a gallbladder mass on CT that was followed by an MRI that raised concern for gallbladder cancer as well as a bile duct stricture followed by EUS and ERCP that revealed no further pathology. She was taken to the OR 8/13 for a lap chole with the intent that should frozen pathology demonstrate concern for cancer, a more radical resection would be done. However, at the time of surgery, though frozen section was negative for cancer, final pathology demonstrated a T2N0 well differentiated adenocarcinoma, margins negative.     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  female pleasant patient above history of Gallbladder cancer; history of DCIS; also end-stage renal disease on dialysis; is here for port evaluation/maintenance flushes.   Patient follows up with oncologist at [facility]; she follows up at the cancer center here for her port flushes. She has not had any blood clots.  REVIEW OF SYSTEMS:  A complete 10 point review of system is done which is negative except mentioned above/history of present illness.   PAST MEDICAL HISTORY :  Past Medical History:  Diagnosis Date  . Barrett's esophagus without dysplasia   . Cholangiocarcinoma ([place])   . Chronic kidney disease   . 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 Endometrial thickening on ultra sound  04/19/2015  . History of ulcerative colitis   . History of uterine fibroid   . Osteoporosis     PAST SURGICAL HISTORY :   Past Surgical History:  Procedure Laterality Date  . CHOLECYSTECTOMY    . colonoscopy  05/08/2016  . LAPAROSCOPIC PARTIAL HEPATECTOMY    . MASTECTOMY PARTIAL / LUMPECTOMY    . UPPER GI ENDOSCOPY  05/08/2016    FAMILY HISTORY :  No family history on file.  SOCIAL HISTORY:   Social History  Substance Use Topics  . Smoking status: Never Smoker  . Smokeless tobacco: Not on file  . Alcohol use No    ALLERGIES:  is allergic to aspirin; tape; librax  [chlordiazepoxide-clidinium]; and povidone iodine.  MEDICATIONS:  Current Outpatient Prescriptions  Medication Sig Dispense Refill  . amitriptyline (ELAVIL) 10 MG tablet Take 1 tablet by mouth daily.  6  . anastrozole (ARIMIDEX) 1 MG tablet Take 1 mg by mouth daily.    Marland Kitchen aspirin EC 81 MG tablet Take by mouth.    . calcium acetate, Phos Binder, (PHOSLYRA) 667 MG/5ML SOLN Take 667 mg by mouth 3 (three) times daily with meals. Prn calcium levels    . Darbepoetin Alfa (ARANESP, ALBUMIN FREE,) 100 MCG/0.5ML SOSY injection Inject into the vein.    . folic acid-vitamin b complex-vitamin c-selenium-zinc (DIALYVITE) 3 MG TABS tablet Take 1 tablet by mouth daily.    . Heparin Lock Flush (HEPARIN FLUSH, PORCINE,) 100 UNIT/ML injection 100U/ml Flush port with 70ml every 30  days.    . lanthanum (FOSRENOL) 500 MG chewable tablet Chew 2 tablets by mouth 3 (three) times daily with meals.    . lidocaine-prilocaine (EMLA) cream Apply to access before dialysis    . midodrine (PROAMATINE) 5 MG tablet Take 5 mg by mouth. 1 tab 2 hrs after dialysis  2 tabs before dialysis    . SENSIPAR 60 MG tablet TAKE 1 TABLET BY MOUTH ONCE A DAY WITH MEALS  11  . sodium chloride 0.9 % injection 12ml of NS flush into port once every 30 days. Follow with heparin flush    . acetaminophen (TYLENOL) 500 MG tablet     . ibuprofen (ADVIL,MOTRIN) 200 MG  tablet Take 200 mg by mouth every 6 (six) hours as needed for mild pain.    . metoprolol tartrate (LOPRESSOR) 25 MG tablet Take 6.25 mg by mouth 2 (two) times daily. On Mondays, Wednesday and Fridays patient takes 0.25 tablets (6.25 mg) tablet.  All other days, patient takes 6.25 mgs twice daily.     No current facility-administered medications for this visit.     PHYSICAL EXAMINATION: ECOG PERFORMANCE STATUS: 0 - Asymptomatic  BP (!) 95/43 (BP Location: Left Leg, Patient Position: Sitting) Comment (BP Location): pt declined bp on arms  Pulse 73   Temp 98.3 F (36.8 C) (Oral)   Resp 20   Ht 5\' 1"  (1.549 m)   Wt 205 lb 0.4 oz (93 kg)   BMI 38.74 kg/m   Filed Weights   05/22/16 1037  Weight: 205 lb 0.4 oz (93 kg)    GENERAL: Well-nourished well-developed; Alert, no distress and comfortable.   Alone.  EYES: no pallor or icterus OROPHARYNX: no thrush or ulceration; good dentition  NECK: supple, no masses felt LYMPH:  no palpable lymphadenopathy in the cervical, axillary or inguinal regions LUNGS: clear to auscultation and  No wheeze or crackles HEART/CVS: regular rate & rhythm and no murmurs; No lower extremity edema ABDOMEN:abdomen soft, non-tender and normal bowel sounds Musculoskeletal:no cyanosis of digits and no clubbing  PSYCH: alert & oriented x 3 with fluent speech NEURO: no focal motor/sensory deficits SKIN:  no rashes or significant lesions; right chest wall port-normal. Permacath noted in the left chest wall for dialysis  LABORATORY DATA:  I have reviewed the data as listed No results found for: NA, K, CL, CO2, GLUCOSE, BUN, CREATININE, CALCIUM, PROT, ALBUMIN, AST, ALT, ALKPHOS, BILITOT, GFRNONAA, GFRAA  No results found for: SPEP, UPEP  No results found for: WBC, NEUTROABS, HGB, HCT, MCV, PLT    Chemistry   No results found for: NA, K, CL, CO2, BUN, CREATININE, GLU No results found for: CALCIUM, ALKPHOS, AST, ALT, BILITOT     RADIOGRAPHIC STUDIES: I have  personally reviewed the radiological images as listed and agreed with the findings in the report. No results found.   ASSESSMENT & PLAN:  Poor venous access # Poor IV access- has it flushed every 6 weeks. No concerns for any malfunction.  # gall bladder ca s/p surgery- follows with Dr.Zani; [facility]  # DCIS- follows up with Dr.Westbrook, [facility]  # ESRD- on permacath.  # port flush every 6 weeks/ follow up with MD in 12 months.    No orders of the defined types were placed in this encounter.  All questions were answered. The patient knows to call the clinic with any problems, questions or concerns.      Cammie Sickle, MD 05/22/2016 6:57 PM

## 2016-05-22 NOTE — Assessment & Plan Note (Addendum)
#   Poor IV access- has it flushed every 6 weeks. No concerns for any malfunction.  # gall bladder ca s/p surgery- follows with Dr.Zani; [facility]  # DCIS- follows up with Dr.Westbrook, [facility]  # ESRD- on permacath.  # port flush every 6 weeks/ follow up with MD in 12 months.

## 2016-05-24 DIAGNOSIS — K589 Irritable bowel syndrome without diarrhea: Secondary | ICD-10-CM | POA: Insufficient documentation

## 2016-06-19 ENCOUNTER — Inpatient Hospital Stay: Payer: Medicare Other | Attending: Family Medicine

## 2016-06-19 VITALS — BP 118/89 | HR 110 | Temp 98.0°F | Resp 21

## 2016-06-19 DIAGNOSIS — Z853 Personal history of malignant neoplasm of breast: Secondary | ICD-10-CM | POA: Diagnosis not present

## 2016-06-19 DIAGNOSIS — C23 Malignant neoplasm of gallbladder: Secondary | ICD-10-CM

## 2016-06-19 DIAGNOSIS — Z452 Encounter for adjustment and management of vascular access device: Secondary | ICD-10-CM | POA: Diagnosis not present

## 2016-06-19 MED ORDER — HEPARIN SOD (PORK) LOCK FLUSH 100 UNIT/ML IV SOLN
500.0000 [IU] | Freq: Once | INTRAVENOUS | Status: AC
  Administered 2016-06-19: 500 [IU] via INTRAVENOUS

## 2016-06-19 MED ORDER — SODIUM CHLORIDE 0.9% FLUSH
10.0000 mL | INTRAVENOUS | Status: DC | PRN
  Administered 2016-06-19: 10 mL via INTRAVENOUS
  Filled 2016-06-19: qty 10

## 2016-07-31 ENCOUNTER — Inpatient Hospital Stay: Payer: Medicare Other

## 2016-08-02 ENCOUNTER — Inpatient Hospital Stay: Payer: Medicare Other | Attending: Family Medicine

## 2016-08-02 DIAGNOSIS — Z95828 Presence of other vascular implants and grafts: Secondary | ICD-10-CM

## 2016-08-02 DIAGNOSIS — Z452 Encounter for adjustment and management of vascular access device: Secondary | ICD-10-CM | POA: Insufficient documentation

## 2016-08-02 DIAGNOSIS — Z853 Personal history of malignant neoplasm of breast: Secondary | ICD-10-CM | POA: Diagnosis present

## 2016-08-02 MED ORDER — SODIUM CHLORIDE 0.9% FLUSH
10.0000 mL | INTRAVENOUS | Status: DC | PRN
  Administered 2016-08-02: 10 mL via INTRAVENOUS
  Filled 2016-08-02: qty 10

## 2016-08-02 MED ORDER — HEPARIN SOD (PORK) LOCK FLUSH 100 UNIT/ML IV SOLN
500.0000 [IU] | Freq: Once | INTRAVENOUS | Status: AC
  Administered 2016-08-02: 500 [IU] via INTRAVENOUS

## 2016-08-02 MED ORDER — HEPARIN SOD (PORK) LOCK FLUSH 100 UNIT/ML IV SOLN
INTRAVENOUS | Status: AC
  Filled 2016-08-02: qty 5

## 2016-09-11 ENCOUNTER — Inpatient Hospital Stay: Payer: Medicare Other | Attending: Family Medicine

## 2016-09-11 VITALS — BP 156/88 | HR 106 | Temp 98.8°F | Resp 20

## 2016-09-11 DIAGNOSIS — Z853 Personal history of malignant neoplasm of breast: Secondary | ICD-10-CM | POA: Diagnosis present

## 2016-09-11 DIAGNOSIS — Z452 Encounter for adjustment and management of vascular access device: Secondary | ICD-10-CM | POA: Diagnosis not present

## 2016-09-11 DIAGNOSIS — Z95828 Presence of other vascular implants and grafts: Secondary | ICD-10-CM

## 2016-09-11 MED ORDER — SODIUM CHLORIDE 0.9% FLUSH
10.0000 mL | INTRAVENOUS | Status: DC | PRN
  Administered 2016-09-11: 10 mL via INTRAVENOUS
  Filled 2016-09-11: qty 10

## 2016-09-11 MED ORDER — HEPARIN SOD (PORK) LOCK FLUSH 100 UNIT/ML IV SOLN
500.0000 [IU] | Freq: Once | INTRAVENOUS | Status: AC
  Administered 2016-09-11: 500 [IU] via INTRAVENOUS

## 2016-09-22 DIAGNOSIS — G8929 Other chronic pain: Secondary | ICD-10-CM | POA: Insufficient documentation

## 2016-10-23 ENCOUNTER — Inpatient Hospital Stay: Payer: Medicare Other | Attending: Family Medicine

## 2016-10-23 DIAGNOSIS — Z452 Encounter for adjustment and management of vascular access device: Secondary | ICD-10-CM | POA: Diagnosis not present

## 2016-10-23 DIAGNOSIS — C23 Malignant neoplasm of gallbladder: Secondary | ICD-10-CM | POA: Diagnosis not present

## 2016-10-23 DIAGNOSIS — Z95828 Presence of other vascular implants and grafts: Secondary | ICD-10-CM

## 2016-10-23 MED ORDER — SODIUM CHLORIDE 0.9% FLUSH
10.0000 mL | INTRAVENOUS | Status: DC | PRN
  Administered 2016-10-23: 10 mL via INTRAVENOUS
  Filled 2016-10-23: qty 10

## 2016-10-23 MED ORDER — HEPARIN SOD (PORK) LOCK FLUSH 100 UNIT/ML IV SOLN
500.0000 [IU] | Freq: Once | INTRAVENOUS | Status: AC
  Administered 2016-10-23: 500 [IU] via INTRAVENOUS

## 2016-10-23 MED ORDER — HEPARIN SOD (PORK) LOCK FLUSH 100 UNIT/ML IV SOLN
INTRAVENOUS | Status: AC
  Filled 2016-10-23: qty 5

## 2016-11-20 ENCOUNTER — Encounter: Payer: Self-pay | Admitting: Obstetrics & Gynecology

## 2016-11-20 ENCOUNTER — Ambulatory Visit (INDEPENDENT_AMBULATORY_CARE_PROVIDER_SITE_OTHER): Payer: Medicare Other | Admitting: Obstetrics & Gynecology

## 2016-11-20 VITALS — BP 118/78 | HR 110 | Ht 59.0 in | Wt 206.0 lb

## 2016-11-20 DIAGNOSIS — N761 Subacute and chronic vaginitis: Secondary | ICD-10-CM

## 2016-11-20 NOTE — Progress Notes (Signed)
  HPI:      [name] is a 71 y.o. No obstetric history on file. who LMP was No LMP recorded., presents today for a problem visit.  She complains of:  Vaginitis: Patient complains of an abnormal vaginal discharge for several months. Vaginal symptoms include discharge described as white and clear.Vulvar symptoms include none.STI Risk: Very low risk of STD exposureDischarge described as: white.Other associated symptoms: none.Menstrual pattern: She had been bleeding - no PMB. Contraception: none No odor or itching or burning (she did have these sx's in Oct 2017 and resolved w Metrogel.)  PMHx: She  has a past medical history of Barrett's esophagus without dysplasia; Cholangiocarcinoma ([place]); Chronic kidney disease; DCIS (ductal carcinoma in situ) of breast (11/02/2014); Endometrial thickening on ultra sound (04/19/2015); History of ulcerative colitis; History of uterine fibroid; and Osteoporosis. Also,  has a past surgical history that includes Cholecystectomy; Laparoscopic partial hepatectomy; Mastectomy partial / lumpectomy; colonoscopy (05/08/2016); and Upper gi endoscopy (05/08/2016)., family history includes Breast cancer in her mother.,  reports that she has never smoked. She has never used smokeless tobacco. She reports that she does not drink alcohol or use drugs.  She has a current medication list which includes the following prescription(s): acetaminophen, acetaminophen, amitriptyline, anastrozole, aspirin ec, bismuth subsalicylate, calcium acetate (phos binder), ciprodex, darbepoetin alfa, folic acid-vitamin b complex-vitamin c-selenium-zinc, heparin flush (porcine), ibuprofen, lanthanum, lidocaine-prilocaine, metoprolol tartrate, metoprolol tartrate, midodrine, midodrine, omeprazole, sensipar, and sodium chloride. Also, is allergic to aspirin; tape; librax  [chlordiazepoxide-clidinium]; povidone iodine; and povidone-iodine.  Review of Systems  Constitutional: Negative for chills, fever  and malaise/fatigue.  HENT: Negative for congestion, sinus pain and sore throat.   Eyes: Negative for blurred vision and pain.  Respiratory: Negative for cough and wheezing.   Cardiovascular: Negative for chest pain and leg swelling.  Gastrointestinal: Negative for abdominal pain, constipation, diarrhea, heartburn, nausea and vomiting.  Genitourinary: Negative for dysuria, frequency, hematuria and urgency.  Musculoskeletal: Negative for back pain, joint pain, myalgias and neck pain.  Skin: Negative for itching and rash.  Neurological: Negative for dizziness, tremors and weakness.  Endo/Heme/Allergies: Does not bruise/bleed easily.  Psychiatric/Behavioral: Negative for depression. The patient is not nervous/anxious and does not have insomnia.     Objective: BP 118/78   Pulse (!) 110   Ht 4\' 11"  (1.499 m)   Wt 206 lb (93.4 kg)   BMI 41.61 kg/m  Physical Exam  Constitutional: She is oriented to person, place, and time. She appears well-developed and well-nourished. No distress.  Genitourinary: Vagina normal. Pelvic exam was performed with patient supine. There is no rash, tenderness or lesion on the right labia. There is no rash, tenderness or lesion on the left labia. No erythema or bleeding in the vagina.  Abdominal: Soft. She exhibits no distension. There is no tenderness.  Musculoskeletal: Normal range of motion.  Neurological: She is alert and oriented to person, place, and time. No cranial nerve deficit.  Skin: Skin is warm and dry.  Psychiatric: She has a normal mood and affect.    ASSESSMENT/PLAN:    Problem List Items Addressed This Visit    None    Visit Diagnoses    Subacute vaginitis    -  Primary    One Swab testing to cover all possibillities due to chronic nature of discharge Likely physiologic, somewhat assoc w Dialysis and blood thinner therapy with that.  Barnett Applebaum, MD, [facility], [facility] 11/20/2016  2:23 PM

## 2016-11-20 NOTE — Patient Instructions (Signed)
Vaginitis Vaginitis is an inflammation of the vagina. It is most often caused by a change in the normal balance of the bacteria and yeast that live in the vagina. This change in balance causes an overgrowth of certain bacteria or yeast, which causes the inflammation. There are different types of vaginitis, but the most common types are:  Bacterial vaginosis.  Yeast infection (candidiasis).  Trichomoniasis vaginitis. This is a sexually transmitted infection (STI).  Viral vaginitis.  Atrophic vaginitis.  Allergic vaginitis.  Normal variant, non-infectious What are the causes? The cause depends on the type of vaginitis. Vaginitis can be caused by:  Bacteria (bacterial vaginosis).  Yeast (yeast infection).  A parasite (trichomoniasis vaginitis)  A virus (viral vaginitis).  Low hormone levels (atrophic vaginitis). Low hormone levels can occur during pregnancy, breastfeeding, or after menopause.  Irritants, such as bubble baths, scented tampons, and feminine sprays (allergic vaginitis). Other factors can change the normal balance of the yeast and bacteria that live in the vagina. These include:  Antibiotic medicines.  Poor hygiene.  Diaphragms, vaginal sponges, spermicides, birth control pills, and intrauterine devices (IUD).  Sexual intercourse.  Infection.  Uncontrolled diabetes.  A weakened immune system. What are the signs or symptoms? Symptoms can vary depending on the cause of the vaginitis. Common symptoms include:  Abnormal vaginal discharge.  The discharge is white, gray, or yellow with bacterial vaginosis.  The discharge is thick, white, and cheesy with a yeast infection.  The discharge is frothy and yellow or greenish with trichomoniasis.  A bad vaginal odor.  The odor is fishy with bacterial vaginosis.  Vaginal itching, pain, or swelling.  Painful intercourse.  Pain or burning when urinating. Sometimes there are no symptoms. How is this  treated? Treatment will vary depending on the type of infection.  Bacterial vaginosis and trichomoniasis are often treated with antibiotic creams or pills.  Yeast infections are often treated with antifungal medicines, such as vaginal creams or suppositories.  Viral vaginitis has no cure, but symptoms can be treated with medicines that relieve discomfort. Your sexual partner should be treated as well.  Atrophic vaginitis may be treated with an estrogen cream, pill, suppository, or vaginal ring. If vaginal dryness occurs, lubricants and moisturizing creams may help. You may be told to avoid scented soaps, sprays, or douches.  Allergic vaginitis treatment involves quitting the use of the product that is causing the problem. Vaginal creams can be used to treat the symptoms. Follow these instructions at home:  Take all medicines as directed by your caregiver.  Keep your genital area clean and dry. Avoid soap and only rinse the area with water.  Avoid douching. It can remove the healthy bacteria in the vagina.  Do not use tampons or have sexual intercourse until your vaginitis has been treated. Use sanitary pads while you have vaginitis.  Wipe from front to back. This avoids the spread of bacteria from the rectum to the vagina.  Let air reach your genital area. ? Wear cotton underwear to decrease moisture buildup.  Avoid wearing underwear while you sleep until your vaginitis is gone.  Avoid tight pants and underwear or nylons without a cotton panel.  Take off wet clothing (especially bathing suits) as soon as possible.  Use mild, non-scented products. Avoid using irritants, such as:  Scented feminine sprays.  Fabric softeners.  Scented detergents.  Scented tampons.  Scented soaps or bubble baths.  Practice safe sex and use condoms. Condoms may prevent the spread of trichomoniasis and viral vaginitis.  Contact a health care provider if:  You have abdominal pain.  You have  symptoms that last for more than 2-3 days.  You have a fever and your symptoms suddenly get worse. This information is not intended to replace advice given to you by your health care provider. Make sure you discuss any questions you have with your health care provider. Document Released: 06/17/2007 Document Revised: 07/11/2016 Document Reviewed: 07/11/2016 Elsevier Interactive Patient Education  2017 [employer].

## 2016-12-04 ENCOUNTER — Inpatient Hospital Stay: Payer: Medicare Other

## 2016-12-04 ENCOUNTER — Telehealth: Payer: Self-pay | Admitting: Obstetrics & Gynecology

## 2016-12-04 NOTE — Telephone Encounter (Signed)
Pt is calling about her result from her appt on 11/20/16 with Dr. Kenton Kingfisher. Please [phone]

## 2016-12-05 NOTE — Telephone Encounter (Signed)
Pt aware of negative results.

## 2017-01-08 ENCOUNTER — Inpatient Hospital Stay: Payer: Medicare Other | Attending: Internal Medicine

## 2017-01-08 VITALS — BP 142/98 | HR 156 | Resp 20

## 2017-01-08 DIAGNOSIS — Z452 Encounter for adjustment and management of vascular access device: Secondary | ICD-10-CM | POA: Diagnosis not present

## 2017-01-08 DIAGNOSIS — Z95828 Presence of other vascular implants and grafts: Secondary | ICD-10-CM

## 2017-01-08 DIAGNOSIS — Z8589 Personal history of malignant neoplasm of other organs and systems: Secondary | ICD-10-CM | POA: Diagnosis not present

## 2017-01-08 MED ORDER — HEPARIN SOD (PORK) LOCK FLUSH 100 UNIT/ML IV SOLN
500.0000 [IU] | Freq: Once | INTRAVENOUS | Status: AC
  Administered 2017-01-08: 500 [IU] via INTRAVENOUS
  Filled 2017-01-08: qty 5

## 2017-01-08 MED ORDER — SODIUM CHLORIDE 0.9% FLUSH
10.0000 mL | INTRAVENOUS | Status: DC | PRN
  Administered 2017-01-08: 10 mL via INTRAVENOUS
  Filled 2017-01-08: qty 10

## 2017-01-15 ENCOUNTER — Inpatient Hospital Stay: Payer: Medicare Other

## 2017-02-19 ENCOUNTER — Inpatient Hospital Stay: Payer: Medicare Other | Attending: Internal Medicine

## 2017-02-19 DIAGNOSIS — C23 Malignant neoplasm of gallbladder: Secondary | ICD-10-CM | POA: Insufficient documentation

## 2017-02-19 DIAGNOSIS — Z95828 Presence of other vascular implants and grafts: Secondary | ICD-10-CM

## 2017-02-19 DIAGNOSIS — Z452 Encounter for adjustment and management of vascular access device: Secondary | ICD-10-CM | POA: Diagnosis not present

## 2017-02-19 MED ORDER — SODIUM CHLORIDE 0.9% FLUSH
10.0000 mL | INTRAVENOUS | Status: DC | PRN
  Administered 2017-02-19: 10 mL via INTRAVENOUS
  Filled 2017-02-19: qty 10

## 2017-02-19 MED ORDER — HEPARIN SOD (PORK) LOCK FLUSH 100 UNIT/ML IV SOLN
INTRAVENOUS | Status: AC
  Filled 2017-02-19: qty 5

## 2017-02-19 MED ORDER — HEPARIN SOD (PORK) LOCK FLUSH 100 UNIT/ML IV SOLN
500.0000 [IU] | Freq: Once | INTRAVENOUS | Status: AC
  Administered 2017-02-19: 500 [IU] via INTRAVENOUS

## 2017-02-26 ENCOUNTER — Inpatient Hospital Stay: Payer: Medicare Other

## 2017-02-27 DIAGNOSIS — R0602 Shortness of breath: Secondary | ICD-10-CM | POA: Insufficient documentation

## 2017-04-02 ENCOUNTER — Inpatient Hospital Stay: Payer: Medicare Other

## 2017-04-04 ENCOUNTER — Inpatient Hospital Stay: Payer: Medicare Other | Attending: Internal Medicine

## 2017-04-04 DIAGNOSIS — Z95828 Presence of other vascular implants and grafts: Secondary | ICD-10-CM

## 2017-04-04 DIAGNOSIS — C23 Malignant neoplasm of gallbladder: Secondary | ICD-10-CM | POA: Insufficient documentation

## 2017-04-04 DIAGNOSIS — Z452 Encounter for adjustment and management of vascular access device: Secondary | ICD-10-CM | POA: Insufficient documentation

## 2017-04-04 MED ORDER — SODIUM CHLORIDE 0.9% FLUSH
10.0000 mL | INTRAVENOUS | Status: DC | PRN
  Administered 2017-04-04: 10 mL via INTRAVENOUS
  Filled 2017-04-04: qty 10

## 2017-04-04 MED ORDER — HEPARIN SOD (PORK) LOCK FLUSH 100 UNIT/ML IV SOLN
INTRAVENOUS | Status: AC
  Filled 2017-04-04: qty 5

## 2017-04-04 MED ORDER — HEPARIN SOD (PORK) LOCK FLUSH 100 UNIT/ML IV SOLN
500.0000 [IU] | Freq: Once | INTRAVENOUS | Status: AC
  Administered 2017-04-04: 500 [IU] via INTRAVENOUS

## 2017-04-09 ENCOUNTER — Inpatient Hospital Stay: Payer: Medicare Other

## 2017-05-01 DIAGNOSIS — T82898A Other specified complication of vascular prosthetic devices, implants and grafts, initial encounter: Secondary | ICD-10-CM | POA: Insufficient documentation

## 2017-05-21 ENCOUNTER — Inpatient Hospital Stay: Payer: Medicare Other | Attending: Family Medicine

## 2017-05-21 ENCOUNTER — Inpatient Hospital Stay (HOSPITAL_BASED_OUTPATIENT_CLINIC_OR_DEPARTMENT_OTHER): Payer: Medicare Other | Admitting: Internal Medicine

## 2017-05-21 DIAGNOSIS — Z79899 Other long term (current) drug therapy: Secondary | ICD-10-CM

## 2017-05-21 DIAGNOSIS — Z95828 Presence of other vascular implants and grafts: Secondary | ICD-10-CM

## 2017-05-21 DIAGNOSIS — K2270 Barrett's esophagus without dysplasia: Secondary | ICD-10-CM | POA: Insufficient documentation

## 2017-05-21 DIAGNOSIS — M2550 Pain in unspecified joint: Secondary | ICD-10-CM | POA: Diagnosis not present

## 2017-05-21 DIAGNOSIS — Z8719 Personal history of other diseases of the digestive system: Secondary | ICD-10-CM | POA: Insufficient documentation

## 2017-05-21 DIAGNOSIS — Z7982 Long term (current) use of aspirin: Secondary | ICD-10-CM | POA: Insufficient documentation

## 2017-05-21 DIAGNOSIS — D6859 Other primary thrombophilia: Secondary | ICD-10-CM | POA: Insufficient documentation

## 2017-05-21 DIAGNOSIS — M810 Age-related osteoporosis without current pathological fracture: Secondary | ICD-10-CM

## 2017-05-21 DIAGNOSIS — Z803 Family history of malignant neoplasm of breast: Secondary | ICD-10-CM | POA: Insufficient documentation

## 2017-05-21 DIAGNOSIS — Z992 Dependence on renal dialysis: Secondary | ICD-10-CM | POA: Diagnosis not present

## 2017-05-21 DIAGNOSIS — Z9012 Acquired absence of left breast and nipple: Secondary | ICD-10-CM

## 2017-05-21 DIAGNOSIS — D0512 Intraductal carcinoma in situ of left breast: Secondary | ICD-10-CM | POA: Insufficient documentation

## 2017-05-21 DIAGNOSIS — N186 End stage renal disease: Secondary | ICD-10-CM | POA: Insufficient documentation

## 2017-05-21 DIAGNOSIS — I129 Hypertensive chronic kidney disease with stage 1 through stage 4 chronic kidney disease, or unspecified chronic kidney disease: Secondary | ICD-10-CM | POA: Diagnosis not present

## 2017-05-21 DIAGNOSIS — Z452 Encounter for adjustment and management of vascular access device: Secondary | ICD-10-CM

## 2017-05-21 DIAGNOSIS — Z8509 Personal history of malignant neoplasm of other digestive organs: Secondary | ICD-10-CM | POA: Diagnosis not present

## 2017-05-21 DIAGNOSIS — I878 Other specified disorders of veins: Secondary | ICD-10-CM

## 2017-05-21 MED ORDER — SODIUM CHLORIDE 0.9% FLUSH
10.0000 mL | INTRAVENOUS | Status: DC | PRN
  Administered 2017-05-21: 10 mL via INTRAVENOUS
  Filled 2017-05-21: qty 10

## 2017-05-21 MED ORDER — HEPARIN SOD (PORK) LOCK FLUSH 100 UNIT/ML IV SOLN
500.0000 [IU] | Freq: Once | INTRAVENOUS | Status: AC
  Administered 2017-05-21: 500 [IU] via INTRAVENOUS
  Filled 2017-05-21: qty 5

## 2017-05-21 NOTE — Progress Notes (Signed)
Unable to take bp in upper extremities due to dialysis grafts in upper extremites. bp in ankles taken bilaterally. 90/27 in left ankle and 97/45 in right ankle. Pt refused bp in thigh due to "potential risk for thigh cramping. Pt states that she took her bp at home bp this morning but is unable to tell me what her bp reading reported.  Pt is not symptomatic. She denies any dizziness or shortness of breath.  Patient reports that she is scheduled for another upper endoscopy this Thursday at [facility].

## 2017-05-21 NOTE — Progress Notes (Signed)
[place] OFFICE PROGRESS NOTE  Patient Care Team: Santayana, Titus Mould, DO as PCP - General (Family Medicine)  Cancer Staging Cancer of gallbladder [facility]) Staging form: Gallbladder, AJCC 7th Edition - Clinical: Stage II (T2, N0, M0) - Signed by Evlyn Kanner, NP on 04/19/2015    Oncology History   Laparoscopic cholecystectomy, 04/15/14; Radical cholecystectomy with portal LAD, segment 4B/5 partial hepatectomy, 05/27/14.  found to have a gallbladder mass on CT that was followed by an MRI that raised concern for gallbladder cancer as well as a bile duct stricture followed by EUS and ERCP that revealed no further pathology. She was taken to the OR 8/13 for a lap chole with the intent that should frozen pathology demonstrate concern for cancer, a more radical resection would be done. However, at the time of surgery, though frozen section was negative for cancer, final pathology demonstrated a T2N0 well differentiated adenocarcinoma, margins negative.     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  female pleasant patient above history of Gallbladder cancer; history of DCIS; also end-stage renal disease on dialysis; is here for port evaluation/maintenance flushes.   Patient states that she had multiple grafts/fistulous both upper extremities- which all have failed; clotted off. She states to have been evaluated at [facility] hematology- March 2018. She had questions about her antiphospholipid antibody syndrome workup.  She denies any nausea vomiting abdominal pain. She denies any constipation or diarrhea. She complains of chronic moderate to severe arthritis. Question she has lupus/from her arthritis. She follows up [facility] for her DCIS. She also follows up at [facility] regarding her history of  gallbladder cancer.   REVIEW OF SYSTEMS:  A complete 10 point review of system is done which is negative except mentioned above/history of present illness.   PAST MEDICAL HISTORY :  Past Medical History:  Diagnosis Date  . Barrett's esophagus without dysplasia   . Cholangiocarcinoma ([place])   . Chronic kidney disease   . 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 Endometrial thickening on ultra sound 04/19/2015  . History of ulcerative colitis   . History of uterine fibroid   . Osteoporosis     PAST SURGICAL HISTORY :   Past Surgical History:  Procedure Laterality Date  . CHOLECYSTECTOMY    . colonoscopy  05/08/2016  . LAPAROSCOPIC PARTIAL HEPATECTOMY    . MASTECTOMY PARTIAL / LUMPECTOMY    . UPPER GI ENDOSCOPY  05/08/2016    FAMILY HISTORY :   Family History  Problem Relation Age of Onset  . Breast cancer Mother     SOCIAL HISTORY:   Social History  Substance Use Topics  . Smoking status: Never Smoker  . Smokeless tobacco: Never Used  . Alcohol use No    ALLERGIES:  is allergic to aspirin; tape; librax  [chlordiazepoxide-clidinium]; povidone iodine; and povidone-iodine.  MEDICATIONS:  Current Outpatient Prescriptions  Medication Sig Dispense Refill  . anastrozole (ARIMIDEX) 1 MG tablet Take 1 mg by mouth daily.    Marland Kitchen aspirin EC 81 MG tablet Take 81 mg by mouth daily.     . calcium acetate, Phos Binder, (PHOSLYRA) 667 MG/5ML SOLN Take 667 mg by mouth 3 (three) times daily with meals. Prn calcium  levels    . folic acid-vitamin b complex-vitamin c-selenium-zinc (DIALYVITE) 3 MG TABS tablet Take 1 tablet by mouth daily.    . Heparin Lock Flush (HEPARIN FLUSH, PORCINE,) 100 UNIT/ML injection 100U/ml Flush port with 31ml every 30 days.    Marland Kitchen lidocaine-prilocaine (EMLA) cream Apply to access before dialysis    . metoprolol tartrate (LOPRESSOR) 25 MG tablet 6.25 mg oral tablet twice daily  1  . midodrine (PROAMATINE) 10 MG tablet TAKE 1 TAB BY MOUTH 3 TIMES DAILY  AND 1 TAB IMMEDIATELY BEFORE DIALYSIS AND 1 TAB HALFWAY THROUGH  9  . midodrine (PROAMATINE) 5 MG tablet Take 5 mg by mouth. 1 tab 2 hrs after dialysis  2 tabs before dialysis    . omeprazole (PRILOSEC) 20 MG capsule TAKE ONE CAPSULE BY MOUTH EVERY DAY 30-45 MINUTES BEFORE BREAKFAST  8  . sodium chloride 0.9 % injection 84ml of NS flush into port once every 30 days. Follow with heparin flush    . acetaminophen (TYLENOL) 500 MG tablet Take 500 mg by mouth every 6 (six) hours as needed for moderate pain.     . Bismuth Subsalicylate 563 OV/56EP SUSP Take 15 mLs by mouth daily. As directed as needed    . ibuprofen (ADVIL,MOTRIN) 200 MG tablet Take 200 mg by mouth every 6 (six) hours as needed for mild pain.    Marland Kitchen lanthanum (FOSRENOL) 500 MG chewable tablet Chew 2 tablets by mouth 3 (three) times daily with meals.    . metoprolol tartrate (LOPRESSOR) 25 MG tablet Take 6.25 mg by mouth 2 (two) times daily. On Mondays, Wednesday and Fridays patient takes 0.25 tablets (6.25 mg) tablet.  All other days, patient takes 6.25 mgs twice daily.    . SENSIPAR 60 MG tablet TAKE 1 TABLET BY MOUTH ONCE A DAY WITH MEALS  11   No current facility-administered medications for this visit.     PHYSICAL EXAMINATION: ECOG PERFORMANCE STATUS: 0 - Asymptomatic  BP (!) 97/45 Comment: right leg  Pulse 97   Temp 97.7 F (36.5 C) (Oral)   Resp (!) 22   Ht 4\' 11"  (1.499 m)   Wt 194 lb 0.1 oz (88 kg)   BMI 39.18 kg/m   Filed Weights   05/21/17 1024  Weight: 194 lb 0.1 oz (88 kg)    GENERAL: Well-nourished well-developed; Alert, no distress and comfortable.   Alone.  EYES: no pallor or icterus OROPHARYNX: no thrush or ulceration; good dentition  NECK: supple, no masses felt LYMPH:  no palpable lymphadenopathy in the cervical, axillary or inguinal regions LUNGS: clear to auscultation and  No wheeze or crackles HEART/CVS: regular rate & rhythm and no murmurs; No lower extremity edema ABDOMEN:abdomen soft,  non-tender and normal bowel sounds Musculoskeletal:no cyanosis of digits and no clubbing  PSYCH: alert & oriented x 3 with fluent speech NEURO: no focal motor/sensory deficits SKIN:  no rashes or significant lesions; right chest wall port-normal. Permacath noted in the left chest wall for dialysis  LABORATORY DATA:  I have reviewed the data as listed No results found for: NA, K, CL, CO2, GLUCOSE, BUN, CREATININE, CALCIUM, PROT, ALBUMIN, AST, ALT, ALKPHOS, BILITOT, GFRNONAA, GFRAA  No results found for: SPEP, UPEP  No results found for: WBC, NEUTROABS, HGB, HCT, MCV, PLT    Chemistry   No results found for: NA, K, CL, CO2, BUN, CREATININE, GLU No results found for: CALCIUM, ALKPHOS, AST, ALT, BILITOT     RADIOGRAPHIC STUDIES: I have personally reviewed the radiological  images as listed and agreed with the findings in the report. No results found.   ASSESSMENT & PLAN:  Primary hypercoagulable state ([place]) # Multiple graft failure-unclear etiology. Reviewed the abnormal antiphospholipid antibody workup- including elevated IgM/beta 2 glycoprotein antibodies; abnormal lupus anticoagulant workup. Discussed the difficulty in the diagnosis of antiphospholipid antibody syndrome based on one blood work. It is ideally recommended to have a repeat blood work approximately 12 weeks later. I have asked the patient to follow up with a hematologist [facility]. She is agreeable.  # joint pains/ ? Lupus- Discussed that antiphospholipid antibody syndrome could be associated with autoimmune diseases like lupus. Recommend evaluation with rheumatology consider arthritic pain. Defer to PCP regarding referral.  # Poor IV access- has it flushed every 6 weeks. No concerns for any malfunction.  # gall bladder ca s/p surgery- follows with Dr.Zani; [facility]; last appt in Jan 2018; again CT in jan 2019.   # DCIS- follows up with Dr.Westbrook, [facility]; mammogram next month  # ESRD- on permacath [M/W/F]- [facility]; [place];  multiple gaft/fistula failure- see discussion below  # port flush every 6 weeks/ follow up with MD in 12 months.   Cc;Drs.  Santayana; Westbrook;[facility]ain   No orders of the defined types were placed in this encounter.  All questions were answered. The patient knows to call the clinic with any problems, questions or concerns.      Cammie Sickle, MD 05/21/2017 6:59 PM

## 2017-05-21 NOTE — Assessment & Plan Note (Deleted)
#   Poor IV access- has it flushed every 6 weeks. No concerns for any malfunction.  # Multiple graft failure-  # joint pains/ ? Lupus- defer to rheumaotology..  # gall bladder ca s/p surgery- follows with Dr.Zani; [facility]; last appt in Jan 2018; again CT in jan 2019.   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   Cc; CLEXNTZG;

## 2017-05-21 NOTE — Assessment & Plan Note (Signed)
#   Multiple graft failure-unclear etiology. Reviewed the abnormal antiphospholipid antibody workup- including elevated IgM/beta 2 glycoprotein antibodies; abnormal lupus anticoagulant workup. Discussed the difficulty in the diagnosis of antiphospholipid antibody syndrome based on one blood work. It is ideally recommended to have a repeat blood work approximately 12 weeks later. I have asked the patient to follow up with a hematologist [facility]. She is agreeable.  # joint pains/ ? Lupus- Discussed that antiphospholipid antibody syndrome could be associated with autoimmune diseases like lupus. Recommend evaluation with rheumatology consider arthritic pain. Defer to PCP regarding referral.  # Poor IV access- has it flushed every 6 weeks. No concerns for any malfunction.  # gall bladder ca s/p surgery- follows with Dr.Zani; [facility]; last appt in Jan 2018; again CT in jan 2019.   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   Cc;Drs.  Santayana; Westbrook;[facility]ain

## 2017-06-20 DIAGNOSIS — G4700 Insomnia, unspecified: Secondary | ICD-10-CM | POA: Insufficient documentation

## 2017-06-20 DIAGNOSIS — L821 Other seborrheic keratosis: Secondary | ICD-10-CM | POA: Insufficient documentation

## 2017-07-02 ENCOUNTER — Inpatient Hospital Stay: Payer: Medicare Other

## 2017-08-13 ENCOUNTER — Inpatient Hospital Stay: Payer: Medicare Other | Attending: Internal Medicine

## 2017-08-13 DIAGNOSIS — D6859 Other primary thrombophilia: Secondary | ICD-10-CM | POA: Insufficient documentation

## 2017-08-13 DIAGNOSIS — Z452 Encounter for adjustment and management of vascular access device: Secondary | ICD-10-CM | POA: Diagnosis not present

## 2017-08-13 DIAGNOSIS — Z95828 Presence of other vascular implants and grafts: Secondary | ICD-10-CM

## 2017-08-13 MED ORDER — SODIUM CHLORIDE 0.9% FLUSH
10.0000 mL | INTRAVENOUS | Status: DC | PRN
Start: 2017-08-13 — End: 2017-08-13
  Administered 2017-08-13: 10 mL via INTRAVENOUS
  Filled 2017-08-13: qty 10

## 2017-08-13 MED ORDER — HEPARIN SOD (PORK) LOCK FLUSH 100 UNIT/ML IV SOLN
500.0000 [IU] | Freq: Once | INTRAVENOUS | Status: AC
Start: 2017-08-13 — End: 2017-08-13
  Administered 2017-08-13: 500 [IU] via INTRAVENOUS

## 2017-09-24 ENCOUNTER — Inpatient Hospital Stay: Payer: Medicare Other | Attending: Internal Medicine

## 2017-11-05 ENCOUNTER — Inpatient Hospital Stay: Payer: Medicare Other

## 2017-11-07 ENCOUNTER — Inpatient Hospital Stay: Payer: Medicare Other | Attending: Internal Medicine

## 2017-11-07 VITALS — BP 162/114 | HR 132 | Resp 20

## 2017-11-07 DIAGNOSIS — Z95828 Presence of other vascular implants and grafts: Secondary | ICD-10-CM

## 2017-11-07 DIAGNOSIS — Z452 Encounter for adjustment and management of vascular access device: Secondary | ICD-10-CM | POA: Diagnosis not present

## 2017-11-07 DIAGNOSIS — D0512 Intraductal carcinoma in situ of left breast: Secondary | ICD-10-CM | POA: Diagnosis not present

## 2017-11-07 DIAGNOSIS — D6859 Other primary thrombophilia: Secondary | ICD-10-CM | POA: Insufficient documentation

## 2017-11-07 MED ORDER — SODIUM CHLORIDE 0.9% FLUSH
10.0000 mL | INTRAVENOUS | Status: DC | PRN
  Administered 2017-11-07: 10 mL via INTRAVENOUS
  Filled 2017-11-07: qty 10

## 2017-11-07 MED ORDER — HEPARIN SOD (PORK) LOCK FLUSH 100 UNIT/ML IV SOLN
500.0000 [IU] | Freq: Once | INTRAVENOUS | Status: AC
  Administered 2017-11-07: 500 [IU] via INTRAVENOUS

## 2017-11-14 ENCOUNTER — Encounter: Payer: Self-pay | Admitting: Obstetrics & Gynecology

## 2017-11-14 ENCOUNTER — Ambulatory Visit (INDEPENDENT_AMBULATORY_CARE_PROVIDER_SITE_OTHER): Payer: Medicare Other | Admitting: Obstetrics & Gynecology

## 2017-11-14 VITALS — BP 120/78 | HR 67 | Ht 59.5 in | Wt 191.0 lb

## 2017-11-14 DIAGNOSIS — N898 Other specified noninflammatory disorders of vagina: Secondary | ICD-10-CM | POA: Diagnosis not present

## 2017-11-14 DIAGNOSIS — R9389 Abnormal findings on diagnostic imaging of other specified body structures: Secondary | ICD-10-CM | POA: Diagnosis not present

## 2017-11-14 NOTE — Progress Notes (Signed)
HPI:      Connie Olsen is a 72 y.o. presents today for a problem visit.  She complains of:  Vaginitis: Patient complains of an abnormal vaginal discharge for several months. Vaginal symptoms include discharge described as yellow thick and local irritation.Vulvar symptoms include none.STI Risk: Very low risk of STD exposureDischarge described as: yelloe thick.Other associated symptoms: none.Menstrual pattern: She had been bleeding none. Last year had culture of vagina done that was negative.  No recent treatments.  No recent infection, trauma, travel, change in hygiene or other products.  H/o endometrial thickening on [place].  EMB 2 years ago normal. No recent bleeding.  D/c for 10 mos, no let up.  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 Also,  has a past surgical history that includes Cholecystectomy; Laparoscopic partial hepatectomy; Mastectomy partial / lumpectomy; colonoscopy (05/08/2016); and Upper gi endoscopy (05/08/2016)., family history includes Breast cancer in her mother.,  reports that  has never smoked. she has never used smokeless tobacco. She reports that she does not drink alcohol or use drugs.  She has a current medication list which includes the following prescription(s): acetaminophen, anastrozole, aspirin ec, bismuth subsalicylate, calcium acetate (phos binder), folic acid-vitamin b complex-vitamin c-selenium-zinc, heparin flush (porcine), ibuprofen, lanthanum, lidocaine-prilocaine, metoprolol tartrate, metoprolol tartrate, midodrine, midodrine, omeprazole, sensipar, and sodium chloride. Also, is allergic to aspirin; tape; librax  [chlordiazepoxide-clidinium]; povidone iodine; and povidone-iodine.  Review of  Systems  Constitutional: Negative for chills, fever and malaise/fatigue.  HENT: Negative for congestion, sinus pain and sore throat.   Eyes: Negative for blurred vision and pain.  Respiratory: Negative for cough and wheezing.   Cardiovascular: Negative for chest pain and leg swelling.  Gastrointestinal: Negative for abdominal pain, constipation, diarrhea, heartburn, nausea and vomiting.  Genitourinary: Negative for dysuria, frequency, hematuria and urgency.  Musculoskeletal: Negative for back pain, joint pain, myalgias and neck pain.  Skin: Negative for itching and rash.  Neurological: Negative for dizziness, tremors and weakness.  Endo/Heme/Allergies: Does not bruise/bleed easily.  Psychiatric/Behavioral: Negative for depression. The patient is not nervous/anxious and does not have insomnia.    Microscopic wet-mount exam shows negative for pathogens, normal epithelial cells.  Objective: BP 120/78   Pulse 67   Ht 4' 11.5" (1.511 m)   Wt 191 lb (86.6 kg)   BMI 37.93 kg/m  Physical Exam  Constitutional: She is oriented to person, place, and time. She appears well-developed and well-nourished. No distress.  Genitourinary: Vagina normal and uterus normal. Pelvic exam was performed with patient supine. There is no rash, tenderness or lesion on the right labia. There is no rash, tenderness or lesion on the left labia. No erythema or bleeding in the vagina. Right adnexum does not display mass and does not display tenderness. Left adnexum does not display mass and does not display tenderness. Cervix does not exhibit motion tenderness, discharge, polyp or nabothian cyst.   Uterus is mobile and midaxial. Uterus is not enlarged or exhibiting a mass.  Abdominal: Soft. She exhibits no distension. There is no tenderness.  Musculoskeletal: Normal range of motion.  Neurological: She is alert and oriented to person, place, and time. No cranial nerve deficit.  Skin: Skin is warm and dry.  Psychiatric:  She has a normal mood and affect.   ASSESSMENT/PLAN:   Visit  Diagnoses    Vaginal discharge    -  Primary       Endometrial thickening on [place]   Culture Ultrasound w SIS due to h/o cancer, meds, prior thickening on [place].  Could be etiology behind chronic discharge, esp if infection repeatedly NOT found.  Barnett Applebaum, MD, [facility], [facility] 11/14/2017  12:04 PM

## 2017-11-14 NOTE — Patient Instructions (Signed)
Atrophic Vaginitis Atrophic vaginitis is when the tissues that line the vagina become dry and thin. This is caused by a drop in estrogen. Estrogen helps:  To keep the vagina moist.  To make a clear fluid that helps: ? To lubricate the vagina for sex. ? To protect the vagina from infection.  If the lining of the vagina is dry and thin, it may:  Make sex painful. It may also cause bleeding.  Cause a feeling of: ? Burning. ? Irritation. ? Itchiness.  Make an exam of your vagina painful. It may also cause bleeding.  Make you lose interest in sex.  Cause a burning feeling when you pee.  Make your vaginal fluid (discharge) brown or yellow.  For some women, there are no symptoms. This condition is most common in women who do not get their regular menstrual periods anymore (menopause). This often starts when a woman is 37-79 years old. Follow these instructions at home:  Take medicines only as told by your doctor. Do not use any herbal or alternative medicines unless your doctor says it is okay.  Use over-the-counter products for dryness only as told by your doctor. These include: ? Creams. ? Lubricants. ? Moisturizers.   REPLENS.  Do not douche.  Do not use products that can make your vagina dry. These include: ? Scented feminine sprays. ? Scented tampons. ? Scented soaps.  If it hurts to have sex, tell your sexual partner. Contact a doctor if:  Your discharge looks different than normal.  Your vagina has an unusual smell.  You have new symptoms.  Your symptoms do not get better with treatment.  Your symptoms get worse. This information is not intended to replace advice given to you by your health care provider. Make sure you discuss any questions you have with your health care provider. Document Released: 02/06/2008 Document Revised: 01/26/2016 Document Reviewed: 08/11/2014 Elsevier Interactive Patient Education  [employer].

## 2017-11-20 LAB — NUSWAB VAGINITIS (VG)
Candida albicans, NAA: NEGATIVE
Candida glabrata, NAA: NEGATIVE
Trich vag by NAA: NEGATIVE

## 2017-11-20 NOTE — Progress Notes (Signed)
Let her know lab culture is negative.  Also she needs to have ultrasound scheduled, which I dont see as scheduled, so please look in to that (Sonohysterogram, maybe Izora Gala is approving first?)

## 2017-11-21 NOTE — Progress Notes (Signed)
CAN you call and schedule shgm. Pt aware you will be calling.

## 2017-12-02 ENCOUNTER — Ambulatory Visit: Payer: BLUE CROSS/BLUE SHIELD | Admitting: Obstetrics & Gynecology

## 2017-12-02 ENCOUNTER — Other Ambulatory Visit: Payer: Medicare Other

## 2017-12-17 ENCOUNTER — Inpatient Hospital Stay: Payer: Medicare Other

## 2017-12-17 ENCOUNTER — Other Ambulatory Visit: Payer: Self-pay | Admitting: Obstetrics & Gynecology

## 2017-12-17 DIAGNOSIS — R9389 Abnormal findings on diagnostic imaging of other specified body structures: Secondary | ICD-10-CM

## 2017-12-19 ENCOUNTER — Ambulatory Visit (INDEPENDENT_AMBULATORY_CARE_PROVIDER_SITE_OTHER): Payer: Medicare Other

## 2017-12-19 ENCOUNTER — Encounter: Payer: Self-pay | Admitting: Obstetrics & Gynecology

## 2017-12-19 ENCOUNTER — Ambulatory Visit (INDEPENDENT_AMBULATORY_CARE_PROVIDER_SITE_OTHER): Payer: Medicare Other | Admitting: Obstetrics & Gynecology

## 2017-12-19 VITALS — BP 130/80 | Ht 59.0 in | Wt 186.0 lb

## 2017-12-19 DIAGNOSIS — R9389 Abnormal findings on diagnostic imaging of other specified body structures: Secondary | ICD-10-CM

## 2017-12-19 NOTE — Progress Notes (Signed)
  HPI: Pt has noted endometrial thickening and abnormal discharge, no recent post menopausal bleeding.  Prior US showed thickening as well.  Prior EMB normal.  Ultrasound demonstrates Endometrial thickening, no polyp.  See SIS note below.  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 Also,  has a past surgical history that includes Cholecystectomy; Laparoscopic partial hepatectomy; Mastectomy partial / lumpectomy; colonoscopy (05/08/2016); and Upper gi endoscopy (05/08/2016)., family history includes Breast cancer in her mother.,  reports that she has never smoked. She has never used smokeless tobacco. She reports that she does not drink alcohol or use drugs.  She has a current medication list which includes the following prescription(s): acetaminophen, anastrozole, aspirin ec, bismuth subsalicylate, calcium acetate (phos binder), folic acid-vitamin b complex-vitamin c-selenium-zinc, heparin flush (porcine), ibuprofen, lanthanum, lidocaine-prilocaine, metoprolol tartrate, metoprolol tartrate, midodrine, midodrine, omeprazole, sensipar, and sodium chloride. Also, is allergic to aspirin; tape; librax  [chlordiazepoxide-clidinium]; povidone iodine; and povidone-iodine.  Review of Systems  All other systems reviewed and are negative.  Objective: BP 130/80   Ht 4\' 11"  (1.499 m)   Wt 186 lb (84.4 kg)   BMI 37.57 kg/m   Physical examination Constitutional NAD, Conversant  Skin No rashes, lesions or ulceration.   Extremities: Moves all appropriately.  Normal ROM for age. No lymphadenopathy.  Neuro: Grossly intact  Psych: Oriented to PPT.  Normal mood. Normal affect.   Assessment:  Endometrial thickening on  ultrasound  Sonohysterogram Procedure Note  The indications for this procedure were reviewed with the patient. The procedure was explained in detail and all questions were answered.  The patient was placed in the lithotomy position. A graves speculum was introduced into the vagina and the cervix was visualized. The cervix was prepped with iodine solution. A Cook's Hysterography catheter was then introduced into the uterine cavity and the speculum was removed.   Sterile sonohysterography with 3D Reconstruction was performed. The endometrial cavity was distended with sterile saline. The findings are as follows: Endometrial thickening, no signs of polyp.  The patient tolerated the procedure well without complication, and was discharged to home.   Decision made for no intervention at this time.  [place] and EMB next year (if still thickened).  D&C if has bleeding.  Barnett Applebaum, MD, [facility], [facility] 12/19/2017  10:03 AM

## 2017-12-24 ENCOUNTER — Inpatient Hospital Stay: Payer: Medicare Other | Attending: Internal Medicine

## 2017-12-24 DIAGNOSIS — Z86000 Personal history of in-situ neoplasm of breast: Secondary | ICD-10-CM | POA: Diagnosis not present

## 2017-12-24 DIAGNOSIS — D6859 Other primary thrombophilia: Secondary | ICD-10-CM | POA: Insufficient documentation

## 2017-12-24 DIAGNOSIS — Z95828 Presence of other vascular implants and grafts: Secondary | ICD-10-CM

## 2017-12-24 DIAGNOSIS — Z452 Encounter for adjustment and management of vascular access device: Secondary | ICD-10-CM | POA: Diagnosis not present

## 2017-12-24 MED ORDER — SODIUM CHLORIDE 0.9% FLUSH
10.0000 mL | INTRAVENOUS | Status: DC | PRN
  Administered 2017-12-24: 10 mL via INTRAVENOUS
  Filled 2017-12-24: qty 10

## 2017-12-24 MED ORDER — HEPARIN SOD (PORK) LOCK FLUSH 100 UNIT/ML IV SOLN
500.0000 [IU] | Freq: Once | INTRAVENOUS | Status: AC
  Administered 2017-12-24: 500 [IU] via INTRAVENOUS

## 2017-12-24 MED ORDER — HEPARIN SOD (PORK) LOCK FLUSH 100 UNIT/ML IV SOLN
INTRAVENOUS | Status: AC
  Filled 2017-12-24: qty 5

## 2018-01-28 ENCOUNTER — Inpatient Hospital Stay: Payer: Medicare Other | Attending: Internal Medicine

## 2018-01-28 DIAGNOSIS — D6859 Other primary thrombophilia: Secondary | ICD-10-CM | POA: Insufficient documentation

## 2018-01-28 DIAGNOSIS — Z452 Encounter for adjustment and management of vascular access device: Secondary | ICD-10-CM | POA: Insufficient documentation

## 2018-01-28 DIAGNOSIS — Z95828 Presence of other vascular implants and grafts: Secondary | ICD-10-CM

## 2018-01-28 MED ORDER — HEPARIN SOD (PORK) LOCK FLUSH 10 UNIT/ML IV SOLN
INTRAVENOUS | Status: AC
  Filled 2018-01-28: qty 1

## 2018-01-28 MED ORDER — HEPARIN SOD (PORK) LOCK FLUSH 100 UNIT/ML IV SOLN
500.0000 [IU] | Freq: Once | INTRAVENOUS | Status: AC
  Administered 2018-01-28: 500 [IU] via INTRAVENOUS
  Filled 2018-01-28: qty 5

## 2018-01-28 MED ORDER — SODIUM CHLORIDE 0.9% FLUSH
10.0000 mL | INTRAVENOUS | Status: DC | PRN
  Administered 2018-01-28: 10 mL via INTRAVENOUS
  Filled 2018-01-28: qty 10

## 2018-03-11 ENCOUNTER — Inpatient Hospital Stay: Payer: Medicare Other | Attending: Internal Medicine

## 2018-03-11 VITALS — Temp 97.6°F | Resp 20

## 2018-03-11 DIAGNOSIS — Z452 Encounter for adjustment and management of vascular access device: Secondary | ICD-10-CM | POA: Diagnosis not present

## 2018-03-11 DIAGNOSIS — Z95828 Presence of other vascular implants and grafts: Secondary | ICD-10-CM

## 2018-03-11 DIAGNOSIS — D6859 Other primary thrombophilia: Secondary | ICD-10-CM | POA: Insufficient documentation

## 2018-03-11 MED ORDER — SODIUM CHLORIDE 0.9% FLUSH
10.0000 mL | INTRAVENOUS | Status: DC | PRN
  Administered 2018-03-11: 10 mL via INTRAVENOUS
  Filled 2018-03-11: qty 10

## 2018-03-11 MED ORDER — HEPARIN SOD (PORK) LOCK FLUSH 100 UNIT/ML IV SOLN
500.0000 [IU] | Freq: Once | INTRAVENOUS | Status: AC
  Administered 2018-03-11: 500 [IU] via INTRAVENOUS

## 2018-04-15 ENCOUNTER — Ambulatory Visit: Payer: Medicare Other

## 2018-04-15 ENCOUNTER — Other Ambulatory Visit: Payer: Self-pay

## 2018-04-15 ENCOUNTER — Ambulatory Visit
Admission: EM | Admit: 2018-04-15 | Discharge: 2018-04-15 | Disposition: A | Discharge status: Home / Self Care | Payer: Medicare Other | Attending: Family Medicine | Admitting: Family Medicine

## 2018-04-15 DIAGNOSIS — R05 Cough: Secondary | ICD-10-CM | POA: Diagnosis present

## 2018-04-15 DIAGNOSIS — A498 Other bacterial infections of unspecified site: Secondary | ICD-10-CM

## 2018-04-15 DIAGNOSIS — Z79899 Other long term (current) drug therapy: Secondary | ICD-10-CM | POA: Insufficient documentation

## 2018-04-15 DIAGNOSIS — B961 Klebsiella pneumoniae [K. pneumoniae] as the cause of diseases classified elsewhere: Secondary | ICD-10-CM | POA: Insufficient documentation

## 2018-04-15 DIAGNOSIS — N186 End stage renal disease: Secondary | ICD-10-CM | POA: Insufficient documentation

## 2018-04-15 DIAGNOSIS — Z992 Dependence on renal dialysis: Secondary | ICD-10-CM | POA: Insufficient documentation

## 2018-04-15 DIAGNOSIS — J0100 Acute maxillary sinusitis, unspecified: Secondary | ICD-10-CM

## 2018-04-15 DIAGNOSIS — Z7982 Long term (current) use of aspirin: Secondary | ICD-10-CM | POA: Diagnosis not present

## 2018-04-15 MED ORDER — CEFDINIR 300 MG PO CAPS: ORAL_CAPSULE | ORAL | 0 refills | Status: DC

## 2018-04-15 NOTE — Discharge Instructions (Signed)
Follow up with primary care and kidney specialist as schedule

## 2018-04-15 NOTE — ED Triage Notes (Signed)
Patient complains of cough, fever, night sweats, chills. Patient currently on dialysis. States that they took a blood culture on Wednesday and was given report on Monday that it is Klebsiella. Patient states that she was started on Friday of 2 grams of Ceftazidime and was also given additional dose at Dialysis yesterday. Patient states that now she is still having a ear ache and nasal sinus congestion and drainage. Patient states that she has noticed some shortness of breath with the coughing.

## 2018-04-15 NOTE — ED Provider Notes (Signed)
[facility]    CSN: [id number] Arrival date & time: 04/15/18  1343     History   Chief Complaint Chief Complaint  Patient presents with  . Cough    HPI Connie Olsen is a 72 y.o. female.   72 yo female with a c/o cough, fevers, chills, sweats since last week which has improved since starting treatment through her nephrologist. Patient has end stage renal disease and is on dialysis 3x/week. During dialysis last week she had blood cultures drawn which she was informed were positive for Klebsiella. She has been given 2 doses of ceftazidime through her nephrologist during dialysis (last dose was yesterday). Patient states she feels better but was told to come in to have a chest x-ray.    The history is provided by the patient.  Cough    Past Medical History:  Diagnosis Date  . Barrett's esophagus without dysplasia   . Cholangiocarcinoma ([place])   . Chronic kidney disease   . 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 Endometrial thickening on ultra sound 04/19/2015  . History of ulcerative colitis   . History of uterine fibroid   . Osteoporosis     Patient Active Problem List   Diagnosis Date Noted  . Primary hypercoagulable state ([place]) 05/21/2017  . Endometrial thickening on ultrasound 04/19/2015  . H/O female genital system disorder 03/08/2015  . H/O chronic ulcerative colitis 03/08/2015  . Poor venous access 03/04/2015  . BP (high blood pressure) 11/16/2014  . Adiposity 11/16/2014  . 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 Cancer of gallbladder ([place]) 02/09/2014  . A-fib ([place]) 02/04/2014  . End stage kidney disease ([place]) 02/04/2014    Past Surgical History:  Procedure Laterality Date  . CHOLECYSTECTOMY    . colonoscopy  05/08/2016  . LAPAROSCOPIC PARTIAL HEPATECTOMY    . MASTECTOMY PARTIAL / LUMPECTOMY    . UPPER GI ENDOSCOPY  05/08/2016    OB History    Gravida  2   Para  2   Term  2   Preterm      AB      Living  2     SAB      TAB      Ectopic      Multiple      Live Births               Home Medications    Prior to Admission medications   Medication Sig Start Date End Date Taking? Authorizing Provider  acetaminophen (TYLENOL) 500 MG tablet Take 500 mg by mouth every 6 (six) hours as needed for moderate pain.    Yes [provider]  anastrozole (ARIMIDEX) 1 MG tablet Take 1 mg by mouth daily.   Yes [provider]  aspirin EC 81 MG tablet Take 81 mg by mouth daily.    Yes [provider]  Bismuth Subsalicylate 144 YJ/85UD SUSP Take 15 mLs by mouth daily. As directed as needed   Yes [provider]  folic acid-vitamin b complex-vitamin c-selenium-zinc (DIALYVITE) 3 MG TABS tablet Take 1 tablet by mouth daily.   Yes [provider]  Heparin Lock Flush (HEPARIN FLUSH, PORCINE,) 100 UNIT/ML injection 100U/ml Flush port with 24ml every 30 days. 04/12/15  Yes [provider]  ibuprofen (ADVIL,MOTRIN) 200 MG tablet Take 200 mg by mouth every 6 (six) hours as needed for mild pain.   Yes [provider]  lanthanum (FOSRENOL) 500  MG chewable tablet Chew 2 tablets by mouth 3 (three) times daily with meals.   Yes [provider]  lidocaine-prilocaine (EMLA) cream Apply to access before dialysis 06/04/14  Yes [provider]  metoprolol tartrate (LOPRESSOR) 25 MG tablet 6.25 mg oral tablet twice daily 08/14/16  Yes [provider]  midodrine (PROAMATINE) 10 MG tablet TAKE 1 TAB BY MOUTH 3 TIMES DAILY AND 1 TAB IMMEDIATELY BEFORE DIALYSIS AND 1 TAB HALFWAY THROUGH 10/01/16  Yes [provider]  omeprazole (PRILOSEC) 20 MG capsule TAKE ONE CAPSULE BY MOUTH EVERY DAY 30-45 MINUTES BEFORE BREAKFAST 11/09/16  Yes [provider]  SENSIPAR 60 MG tablet TAKE 1 TABLET BY MOUTH ONCE A DAY WITH MEALS 03/11/15  Yes [provider]  sodium chloride 0.9 % injection 33ml of NS flush into port once  every 30 days. Follow with heparin flush 04/12/15  Yes [provider]  calcium acetate, Phos Binder, (PHOSLYRA) 667 MG/5ML SOLN Take 667 mg by mouth 3 (three) times daily with meals. Prn calcium levels    [provider]  cefdinir (OMNICEF) 300 MG capsule 1 capsule every other day 04/15/18   Norval Gable, MD  metoprolol tartrate (LOPRESSOR) 25 MG tablet Take 6.25 mg by mouth 2 (two) times daily. On Mondays, Wednesday and Fridays patient takes 0.25 tablets (6.25 mg) tablet.  All other days, patient takes 6.25 mgs twice daily. 04/04/15 04/03/16  [provider]  midodrine (PROAMATINE) 5 MG tablet Take 5 mg by mouth. 1 tab 2 hrs after dialysis  2 tabs before dialysis    [provider]    Family History Family History  Problem Relation Age of Onset  . Breast cancer Mother     Social History Social History   Tobacco Use  . Smoking status: Never Smoker  . Smokeless tobacco: Never Used  Substance Use Topics  . Alcohol use: No    Alcohol/week: 0.0 standard drinks  . Drug use: No     Allergies   Aspirin; Tape; Librax  [chlordiazepoxide-clidinium]; Povidone iodine; and Povidone-iodine   Review of Systems Review of Systems  Respiratory: Positive for cough.      Physical Exam Triage Vital Signs ED Triage Vitals  Enc Vitals Group     BP 04/15/18 1403 125/90     Pulse Rate 04/15/18 1403 83     Resp 04/15/18 1403 18     Temp 04/15/18 1403 98.6 F (37 C)     Temp Source 04/15/18 1403 Oral     SpO2 04/15/18 1403 100 %     Weight 04/15/18 1357 185 lb (83.9 kg)     Height 04/15/18 1357 4\' 11"  (1.499 m)     Head Circumference --      Peak Flow --      Pain Score 04/15/18 1357 4     Pain Loc --      Pain Edu? --      Excl. in [place]? --    No data found.  Updated Vital Signs BP 125/90 (BP Location: Right Leg)   Pulse 83   Temp 98.6 F (37 C) (Oral)   Resp 18   Ht 4\' 11"  (1.499 m)   Wt 83.9 kg   SpO2 100%   BMI 37.37 kg/m   Visual  Acuity Right Eye Distance:   Left Eye Distance:   Bilateral Distance:    Right Eye Near:   Left Eye Near:    Bilateral Near:     Physical Exam  Constitutional: She appears well-developed and well-nourished. No distress.  HENT:  Head: Normocephalic and atraumatic.  Right Ear: External ear normal.  Left Ear: External ear normal.  Nose: Right sinus exhibits maxillary sinus tenderness. Left sinus exhibits maxillary sinus tenderness.  Mouth/Throat: Oropharynx is clear and moist. No oropharyngeal exudate.  Cardiovascular: Normal rate, regular rhythm and normal heart sounds.  Pulmonary/Chest: Effort normal and breath sounds normal. No stridor. No respiratory distress. She has no wheezes. She has no rales.  Skin: She is not diaphoretic.  Nursing note and vitals reviewed.    UC Treatments / Results  Labs (all labs ordered are listed, but only abnormal results are displayed) Labs Reviewed - No data to display  EKG None  Radiology Dg Chest 2 View  Result Date: 04/15/2018 CLINICAL DATA:  Cough and chills with fever EXAM: CHEST - 2 VIEW COMPARISON:  None. FINDINGS: Port-A-Cath tip is in the right atrium slightly beyond the cavoatrial junction. Dual-lumen catheter tip is also in the right atrium. No pneumothorax. There is mild left base atelectasis. There is no edema or consolidation. Heart is upper normal in size with pulmonary vascularity normal. No adenopathy. There is aortic atherosclerosis. No evident bone lesions. IMPRESSION: Central catheters as described without pneumothorax. Mild left base atelectasis. No edema or consolidation. Heart upper normal in size with pulmonary vascularity normal. There is aortic atherosclerosis. Aortic Atherosclerosis (ICD10-I70.0). Electronically Signed   By: Lowella Grip III M.D.   On: 04/15/2018 15:00    Procedures Procedures (including critical care time)  Medications Ordered in UC Medications - No data to display  Initial Impression /  Assessment and Plan / UC Course  I have reviewed the triage vital signs and the nursing notes.  Pertinent labs & imaging results that were available during my care of the patient were reviewed by me and considered in my medical decision making (see chart for details).      Final Clinical Impressions(s) / UC Diagnoses   Final diagnoses:  Acute maxillary sinusitis, recurrence not specified  Klebsiella infection     Discharge Instructions     Follow up with primary care and kidney specialist as schedule    ED Prescriptions    Medication Sig Dispense Auth. Provider   cefdinir (OMNICEF) 300 MG capsule 1 capsule every other day 14 capsule Burns Timson, MD     1. x-ray results and diagnosis reviewed with patient 2. rx as per orders above; reviewed possible side effects, interactions, risks and benefits  3. Recommend supportive treatment with  4. Follow-up prn if symptoms worsen or don't improve  Controlled Substance Prescriptions [place] Controlled Substance Registry consulted? Not Applicable   Norval Gable, MD 04/15/18 2010

## 2018-04-22 ENCOUNTER — Inpatient Hospital Stay: Payer: Medicare Other | Attending: Internal Medicine

## 2018-04-22 DIAGNOSIS — Z452 Encounter for adjustment and management of vascular access device: Secondary | ICD-10-CM | POA: Diagnosis not present

## 2018-04-22 DIAGNOSIS — D6859 Other primary thrombophilia: Secondary | ICD-10-CM | POA: Insufficient documentation

## 2018-04-22 DIAGNOSIS — Z95828 Presence of other vascular implants and grafts: Secondary | ICD-10-CM

## 2018-04-22 DIAGNOSIS — D0512 Intraductal carcinoma in situ of left breast: Secondary | ICD-10-CM | POA: Diagnosis not present

## 2018-04-22 MED ORDER — HEPARIN SOD (PORK) LOCK FLUSH 100 UNIT/ML IV SOLN
500.0000 [IU] | Freq: Once | INTRAVENOUS | Status: AC
  Administered 2018-04-22: 500 [IU] via INTRAVENOUS

## 2018-04-22 MED ORDER — SODIUM CHLORIDE 0.9% FLUSH
10.0000 mL | INTRAVENOUS | Status: DC | PRN
  Administered 2018-04-22: 10 mL via INTRAVENOUS
  Filled 2018-04-22: qty 10

## 2018-06-03 ENCOUNTER — Inpatient Hospital Stay: Payer: Medicare Other | Attending: Internal Medicine | Admitting: Internal Medicine

## 2018-06-03 ENCOUNTER — Inpatient Hospital Stay: Payer: Medicare Other

## 2018-06-03 ENCOUNTER — Encounter: Payer: Self-pay | Admitting: Internal Medicine

## 2018-06-03 VITALS — BP 132/78 | HR 84 | Temp 98.5°F | Resp 18 | Wt 182.5 lb

## 2018-06-03 DIAGNOSIS — Z7982 Long term (current) use of aspirin: Secondary | ICD-10-CM | POA: Insufficient documentation

## 2018-06-03 DIAGNOSIS — Z982 Presence of cerebrospinal fluid drainage device: Secondary | ICD-10-CM

## 2018-06-03 DIAGNOSIS — Z992 Dependence on renal dialysis: Secondary | ICD-10-CM | POA: Diagnosis not present

## 2018-06-03 DIAGNOSIS — D6862 Lupus anticoagulant syndrome: Secondary | ICD-10-CM | POA: Insufficient documentation

## 2018-06-03 DIAGNOSIS — Z79899 Other long term (current) drug therapy: Secondary | ICD-10-CM | POA: Insufficient documentation

## 2018-06-03 DIAGNOSIS — N186 End stage renal disease: Secondary | ICD-10-CM | POA: Insufficient documentation

## 2018-06-03 DIAGNOSIS — D0512 Intraductal carcinoma in situ of left breast: Secondary | ICD-10-CM | POA: Insufficient documentation

## 2018-06-03 DIAGNOSIS — Z79811 Long term (current) use of aromatase inhibitors: Secondary | ICD-10-CM | POA: Insufficient documentation

## 2018-06-03 DIAGNOSIS — Z9049 Acquired absence of other specified parts of digestive tract: Secondary | ICD-10-CM

## 2018-06-03 DIAGNOSIS — Z9012 Acquired absence of left breast and nipple: Secondary | ICD-10-CM | POA: Diagnosis not present

## 2018-06-03 DIAGNOSIS — Z8509 Personal history of malignant neoplasm of other digestive organs: Secondary | ICD-10-CM

## 2018-06-03 DIAGNOSIS — Z95828 Presence of other vascular implants and grafts: Secondary | ICD-10-CM

## 2018-06-03 DIAGNOSIS — I878 Other specified disorders of veins: Secondary | ICD-10-CM

## 2018-06-03 MED ORDER — HEPARIN SOD (PORK) LOCK FLUSH 100 UNIT/ML IV SOLN
500.0000 [IU] | Freq: Once | INTRAVENOUS | Status: AC
  Administered 2018-06-03: 500 [IU] via INTRAVENOUS

## 2018-06-03 MED ORDER — SODIUM CHLORIDE 0.9% FLUSH
10.0000 mL | INTRAVENOUS | Status: DC | PRN
  Administered 2018-06-03: 10 mL via INTRAVENOUS
  Filled 2018-06-03: qty 10

## 2018-06-03 NOTE — Patient Instructions (Signed)
#   RECOMMEND FOLLOW UP WITH [facility]; DR.METJIAN ASAP RE: LUPUS ANTI-COAGULANT WORK UP.

## 2018-06-03 NOTE — Progress Notes (Signed)
Pt in for follow up, denies any difficulties or concerns.  

## 2018-06-03 NOTE — Progress Notes (Signed)
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  found to have a gallbladder mass on CT that was followed by an MRI that raised concern for gallbladder cancer as well as a bile duct stricture followed by EUS and ERCP that revealed no further pathology. She was taken to the OR 8/13 for a lap chole with the intent that should frozen pathology demonstrate concern for cancer, a more radical resection would be done. However, at the time of surgery, though frozen section was negative for cancer, final pathology demonstrated a T2N0 well differentiated adenocarcinoma, margins negative.  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 Cena Benton        INTERVAL HISTORY:  Connie Olsen 72 y.o.  female pleasant patient above history of Gallbladder cancer; history of DCIS; also end-stage renal disease on dialysis; is here for port evaluation/maintenance flushes.   Patient continues to get her dialysis through permacath left chest wall.  She previously had failed multiple grafts/fistulas of the upper extremity's.  No new clots or DVTs or PEs.  Patient had a repeat evaluation of her antiphospholipid antibody work-up some in October of 2018-which was still positive [beta glycoprotein IgM greater than 150; also  previously positive in March 2018.]   Patient is currently on aspirin 80 mg a day.    Review of Systems  Constitutional: Negative for chills, diaphoresis, fever, malaise/fatigue and weight loss.  HENT: Negative for nosebleeds and sore throat.   Eyes: Negative for double vision.  Respiratory: Negative for cough, hemoptysis, sputum production, shortness of breath and wheezing.   Cardiovascular: Negative for chest pain, palpitations, orthopnea and leg swelling.  Gastrointestinal: Negative for abdominal pain, blood in stool, constipation, diarrhea, heartburn, melena, nausea and vomiting.  Genitourinary: Negative for dysuria, frequency and urgency.  Musculoskeletal: Negative for back pain and joint pain.  Skin: Negative.  Negative for itching and rash.  Neurological: Negative for dizziness, tingling, focal weakness, weakness and headaches.  Endo/Heme/Allergies: Does not bruise/bleed easily.  Psychiatric/Behavioral: Negative for depression. The patient is not nervous/anxious and does not have insomnia.       PAST MEDICAL HISTORY :  Past Medical History:  Diagnosis Date  . Barrett's esophagus without dysplasia   . Cholangiocarcinoma ([place])   . Chronic kidney disease   . 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 Endometrial thickening on ultra sound 04/19/2015  . History of ulcerative colitis   . History of uterine fibroid   . Osteoporosis     PAST SURGICAL HISTORY :   Past Surgical History:  Procedure Laterality Date  . CHOLECYSTECTOMY    . colonoscopy  05/08/2016  . LAPAROSCOPIC PARTIAL HEPATECTOMY    . MASTECTOMY PARTIAL / LUMPECTOMY    . UPPER GI ENDOSCOPY  05/08/2016    FAMILY HISTORY :   Family  History  Problem Relation Age of Onset  . Breast cancer Mother     SOCIAL HISTORY:   Social History   Tobacco Use  . Smoking status: Never Smoker  . Smokeless tobacco: Never Used  Substance Use Topics  . Alcohol use: No    Alcohol/week: 0.0 standard drinks  .  Drug use: No    ALLERGIES:  is allergic to aspirin; tape; librax  [chlordiazepoxide-clidinium]; povidone iodine; and povidone-iodine.  MEDICATIONS:  Current Outpatient Medications  Medication Sig Dispense Refill  . acetaminophen (TYLENOL) 500 MG tablet Take 500 mg by mouth every 6 (six) hours as needed for moderate pain.     Marland Kitchen anastrozole (ARIMIDEX) 1 MG tablet Take 1 mg by mouth daily.    Marland Kitchen aspirin EC 81 MG tablet Take 81 mg by mouth daily.     . folic acid-vitamin b complex-vitamin c-selenium-zinc (DIALYVITE) 3 MG TABS tablet Take 1 tablet by mouth daily.    . Heparin Lock Flush (HEPARIN FLUSH, PORCINE,) 100 UNIT/ML injection 100U/ml Flush port with 45ml every 30 days.    Marland Kitchen ibuprofen (ADVIL,MOTRIN) 200 MG tablet Take 200 mg by mouth every 6 (six) hours as needed for mild pain.    Marland Kitchen lanthanum (FOSRENOL) 500 MG chewable tablet Chew 2 tablets by mouth 3 (three) times daily with meals.    . lidocaine-prilocaine (EMLA) cream Apply to access before dialysis    . metoprolol tartrate (LOPRESSOR) 25 MG tablet 12.5 mg 2 (two) times daily.   1  . omeprazole (PRILOSEC) 20 MG capsule TAKE ONE CAPSULE BY MOUTH EVERY DAY 30-45 MINUTES BEFORE BREAKFAST  8  . SENSIPAR 60 MG tablet TAKE 1 TABLET BY MOUTH ONCE A DAY WITH MEALS  11  . sodium chloride 0.9 % injection 72ml of NS flush into port once every 30 days. Follow with heparin flush    . Bismuth Subsalicylate 330 QT/62UQ SUSP Take 15 mLs by mouth daily. As directed as needed     No current facility-administered medications for this visit.    Facility-Administered Medications Ordered in Other Visits  Medication Dose Route Frequency Provider Last Rate Last Dose  . sodium chloride flush (NS) 0.9 % injection 10 mL  10 mL Intravenous PRN Cammie Sickle, MD   10 mL at 06/03/18 1039    PHYSICAL EXAMINATION: ECOG PERFORMANCE STATUS: 0 - Asymptomatic  BP 132/78 (BP Location: Left Arm, Patient Position: Sitting)   Pulse 84   Temp 98.5 F (36.9 C)  (Tympanic)   Resp 18   Wt 182 lb 8.7 oz (82.8 kg)   BMI 36.87 kg/m   Filed Weights   06/03/18 1057  Weight: 182 lb 8.7 oz (82.8 kg)    Physical Exam  Constitutional: She is oriented to person, place, and time and well-developed, well-nourished, and in no distress.  She is alone.  She is walking herself.  HENT:  Head: Normocephalic and atraumatic.  Mouth/Throat: Oropharynx is clear and moist. No oropharyngeal exudate.  Eyes: Pupils are equal, round, and reactive to light.  Neck: Normal range of motion. Neck supple.  Cardiovascular: Normal rate and regular rhythm.  Pulmonary/Chest: No respiratory distress. She has no wheezes.  Abdominal: Soft. Bowel sounds are normal. She exhibits no distension and no mass. There is no tenderness. There is no rebound and no guarding.  Musculoskeletal: Normal range of motion. She exhibits no edema or tenderness.  Neurological: She is alert and oriented to person, place, and time.  Skin: Skin is warm.  Psychiatric: Affect normal.  LABORATORY DATA:  I have reviewed the data as listed No results found for: NA, K, CL, CO2, GLUCOSE, BUN, CREATININE, CALCIUM, PROT, ALBUMIN, AST, ALT, ALKPHOS, BILITOT, GFRNONAA, GFRAA  No results found for: SPEP, UPEP  No results found for: WBC, NEUTROABS, HGB, HCT, MCV, PLT    Chemistry   No results found for: NA, K, CL, CO2, BUN, CREATININE, GLU No results found for: CALCIUM, ALKPHOS, AST, ALT, BILITOT     RADIOGRAPHIC STUDIES: I have personally reviewed the radiological images as listed and agreed with the findings in the report. No results found.   ASSESSMENT & PLAN:  Poor venous access # Multiple graft failure-unclear etiology.  Patient had antiphospholipid antibody work [lupus anticoagulant positive/ beta-2 IgM glycoprotein >1502 March 2018 & October 2018.].  Since patient was previously evaluated with [facility] I have reminded the patient to follow-up with [facility] hematology again.  Continue  aspirin 325 mg at this time.  Patient is agreeable.  Written instructions given.  # Poor IV access- has it flushed every 6 weeks. No concerns for any malfunction.  # gall bladder ca stage II.  S/p surgery- follows with Dr.Zani; [facility];; stable.  # DCIS- follows up with Dr.Westbrook, [facility].  Stable.  # ESRD- on permacath [M/W/F][place]; [place]; multiple gaft/fistula failure- see discussion above.   # port flush every 6 weeks/ follow up with MD in 12 months.   Cc; Westbrook;[place]; Dr.Metjian; hematology.   No orders of the defined types were placed in this encounter.  All questions were answered. The patient knows to call the clinic with any problems, questions or concerns.      Cammie Sickle, MD 06/03/2018 2:00 PM

## 2018-06-03 NOTE — Assessment & Plan Note (Addendum)
#   Multiple graft failure-unclear etiology.  Patient had antiphospholipid antibody work [lupus anticoagulant positive/ beta-2 IgM glycoprotein >1502 March 2018 & October 2018.].  Since patient was previously evaluated with [facility]ology I have reminded the patient to follow-up with [facility] hematology again.  Continue aspirin 325 mg at this time.  Patient is agreeable.  Written instructions given.  # Poor IV access- has it flushed every 6 weeks. No concerns for any malfunction.  # gall bladder ca stage II.  S/p surgery- follows with Dr.Zani; [facility];; stable.  # DCIS- follows up with Dr.Westbrook, [facility].  Stable.  # ESRD- on permacath [M/W/F][place]; [place]; multiple gaft/fistula failure- see discussion above.   # port flush every 6 weeks/ follow up with MD in 12 months.   Cc; [place]; Dr.Metjian; hematology.

## 2018-07-15 ENCOUNTER — Inpatient Hospital Stay: Payer: Medicare Other | Attending: Internal Medicine

## 2018-07-15 DIAGNOSIS — D0512 Intraductal carcinoma in situ of left breast: Secondary | ICD-10-CM | POA: Diagnosis present

## 2018-07-15 DIAGNOSIS — Z170 Estrogen receptor positive status [ER+]: Secondary | ICD-10-CM | POA: Insufficient documentation

## 2018-07-15 DIAGNOSIS — Z9012 Acquired absence of left breast and nipple: Secondary | ICD-10-CM | POA: Insufficient documentation

## 2018-07-15 DIAGNOSIS — Z8589 Personal history of malignant neoplasm of other organs and systems: Secondary | ICD-10-CM | POA: Insufficient documentation

## 2018-07-15 DIAGNOSIS — Z79811 Long term (current) use of aromatase inhibitors: Secondary | ICD-10-CM | POA: Insufficient documentation

## 2018-07-15 DIAGNOSIS — Z95828 Presence of other vascular implants and grafts: Secondary | ICD-10-CM

## 2018-07-15 DIAGNOSIS — Z9049 Acquired absence of other specified parts of digestive tract: Secondary | ICD-10-CM | POA: Insufficient documentation

## 2018-07-15 DIAGNOSIS — Z452 Encounter for adjustment and management of vascular access device: Secondary | ICD-10-CM | POA: Diagnosis not present

## 2018-07-15 MED ORDER — SODIUM CHLORIDE 0.9% FLUSH
10.0000 mL | INTRAVENOUS | Status: DC | PRN
  Administered 2018-07-15: 10 mL via INTRAVENOUS
  Filled 2018-07-15: qty 10

## 2018-07-15 MED ORDER — HEPARIN SOD (PORK) LOCK FLUSH 100 UNIT/ML IV SOLN
500.0000 [IU] | Freq: Once | INTRAVENOUS | Status: AC
  Administered 2018-07-15: 500 [IU] via INTRAVENOUS

## 2018-08-25 ENCOUNTER — Inpatient Hospital Stay: Payer: Medicare Other | Attending: Internal Medicine

## 2018-08-25 DIAGNOSIS — Z9012 Acquired absence of left breast and nipple: Secondary | ICD-10-CM | POA: Diagnosis not present

## 2018-08-25 DIAGNOSIS — Z452 Encounter for adjustment and management of vascular access device: Secondary | ICD-10-CM | POA: Diagnosis not present

## 2018-08-25 DIAGNOSIS — Z9049 Acquired absence of other specified parts of digestive tract: Secondary | ICD-10-CM | POA: Insufficient documentation

## 2018-08-25 DIAGNOSIS — D0512 Intraductal carcinoma in situ of left breast: Secondary | ICD-10-CM | POA: Diagnosis present

## 2018-08-25 DIAGNOSIS — Z8509 Personal history of malignant neoplasm of other digestive organs: Secondary | ICD-10-CM | POA: Diagnosis not present

## 2018-08-25 DIAGNOSIS — Z95828 Presence of other vascular implants and grafts: Secondary | ICD-10-CM

## 2018-08-25 MED ORDER — HEPARIN SOD (PORK) LOCK FLUSH 100 UNIT/ML IV SOLN
500.0000 [IU] | Freq: Once | INTRAVENOUS | Status: AC
  Administered 2018-08-25: 500 [IU] via INTRAVENOUS

## 2018-08-25 MED ORDER — SODIUM CHLORIDE 0.9% FLUSH
10.0000 mL | INTRAVENOUS | Status: DC | PRN
  Administered 2018-08-25: 10 mL via INTRAVENOUS
  Filled 2018-08-25: qty 10

## 2018-09-29 DIAGNOSIS — L299 Pruritus, unspecified: Secondary | ICD-10-CM | POA: Insufficient documentation

## 2018-10-07 ENCOUNTER — Inpatient Hospital Stay: Payer: Medicare Other

## 2018-10-28 ENCOUNTER — Inpatient Hospital Stay: Payer: Medicare Other | Attending: Internal Medicine

## 2018-10-28 DIAGNOSIS — Z9012 Acquired absence of left breast and nipple: Secondary | ICD-10-CM | POA: Diagnosis not present

## 2018-10-28 DIAGNOSIS — Z9049 Acquired absence of other specified parts of digestive tract: Secondary | ICD-10-CM | POA: Insufficient documentation

## 2018-10-28 DIAGNOSIS — Z79811 Long term (current) use of aromatase inhibitors: Secondary | ICD-10-CM | POA: Insufficient documentation

## 2018-10-28 DIAGNOSIS — Z452 Encounter for adjustment and management of vascular access device: Secondary | ICD-10-CM | POA: Diagnosis not present

## 2018-10-28 DIAGNOSIS — Z95828 Presence of other vascular implants and grafts: Secondary | ICD-10-CM

## 2018-10-28 DIAGNOSIS — D0512 Intraductal carcinoma in situ of left breast: Secondary | ICD-10-CM | POA: Diagnosis not present

## 2018-10-28 DIAGNOSIS — Z8509 Personal history of malignant neoplasm of other digestive organs: Secondary | ICD-10-CM | POA: Insufficient documentation

## 2018-10-28 MED ORDER — SODIUM CHLORIDE 0.9% FLUSH
10.0000 mL | INTRAVENOUS | Status: DC | PRN
  Administered 2018-10-28: 10 mL via INTRAVENOUS
  Filled 2018-10-28: qty 10

## 2018-10-28 MED ORDER — HEPARIN SOD (PORK) LOCK FLUSH 100 UNIT/ML IV SOLN
500.0000 [IU] | Freq: Once | INTRAVENOUS | Status: AC
  Administered 2018-10-28: 500 [IU] via INTRAVENOUS

## 2018-11-18 ENCOUNTER — Inpatient Hospital Stay: Payer: Medicare Other

## 2018-12-09 ENCOUNTER — Inpatient Hospital Stay: Payer: Medicare Other | Attending: Hematology and Oncology

## 2018-12-09 ENCOUNTER — Other Ambulatory Visit: Payer: Self-pay

## 2018-12-09 DIAGNOSIS — D0512 Intraductal carcinoma in situ of left breast: Secondary | ICD-10-CM | POA: Insufficient documentation

## 2018-12-09 DIAGNOSIS — Z452 Encounter for adjustment and management of vascular access device: Secondary | ICD-10-CM | POA: Diagnosis not present

## 2018-12-09 DIAGNOSIS — Z9012 Acquired absence of left breast and nipple: Secondary | ICD-10-CM | POA: Diagnosis not present

## 2018-12-09 DIAGNOSIS — Z95828 Presence of other vascular implants and grafts: Secondary | ICD-10-CM

## 2018-12-09 DIAGNOSIS — Z8509 Personal history of malignant neoplasm of other digestive organs: Secondary | ICD-10-CM | POA: Insufficient documentation

## 2018-12-09 DIAGNOSIS — Z170 Estrogen receptor positive status [ER+]: Secondary | ICD-10-CM | POA: Insufficient documentation

## 2018-12-09 DIAGNOSIS — Z79811 Long term (current) use of aromatase inhibitors: Secondary | ICD-10-CM | POA: Diagnosis not present

## 2018-12-09 MED ORDER — HEPARIN SOD (PORK) LOCK FLUSH 100 UNIT/ML IV SOLN
500.0000 [IU] | Freq: Once | INTRAVENOUS | Status: AC
  Administered 2018-12-09: 500 [IU] via INTRAVENOUS

## 2018-12-09 MED ORDER — SODIUM CHLORIDE 0.9% FLUSH
10.0000 mL | INTRAVENOUS | Status: DC | PRN
  Administered 2018-12-09: 10 mL via INTRAVENOUS
  Filled 2018-12-09: qty 10

## 2018-12-30 ENCOUNTER — Inpatient Hospital Stay: Payer: Medicare Other

## 2019-02-10 ENCOUNTER — Inpatient Hospital Stay: Payer: Medicare Other

## 2019-02-12 ENCOUNTER — Other Ambulatory Visit: Payer: Self-pay

## 2019-02-12 ENCOUNTER — Inpatient Hospital Stay: Payer: Medicare Other | Attending: Internal Medicine

## 2019-02-12 DIAGNOSIS — Z8509 Personal history of malignant neoplasm of other digestive organs: Secondary | ICD-10-CM | POA: Insufficient documentation

## 2019-02-12 DIAGNOSIS — Z452 Encounter for adjustment and management of vascular access device: Secondary | ICD-10-CM | POA: Diagnosis present

## 2019-02-12 DIAGNOSIS — Z95828 Presence of other vascular implants and grafts: Secondary | ICD-10-CM

## 2019-02-12 DIAGNOSIS — D0512 Intraductal carcinoma in situ of left breast: Secondary | ICD-10-CM | POA: Insufficient documentation

## 2019-02-12 DIAGNOSIS — Z79811 Long term (current) use of aromatase inhibitors: Secondary | ICD-10-CM | POA: Diagnosis not present

## 2019-02-12 MED ORDER — HEPARIN SOD (PORK) LOCK FLUSH 100 UNIT/ML IV SOLN
500.0000 [IU] | Freq: Once | INTRAVENOUS | Status: AC
  Administered 2019-02-12: 500 [IU] via INTRAVENOUS

## 2019-02-12 MED ORDER — SODIUM CHLORIDE 0.9% FLUSH
10.0000 mL | INTRAVENOUS | Status: DC | PRN
  Administered 2019-02-12: 11:00:00 10 mL via INTRAVENOUS
  Filled 2019-02-12: qty 10

## 2019-03-24 ENCOUNTER — Inpatient Hospital Stay: Payer: Medicare Other | Attending: Internal Medicine

## 2019-04-02 ENCOUNTER — Inpatient Hospital Stay: Payer: Medicare Other | Attending: Internal Medicine

## 2019-04-02 ENCOUNTER — Other Ambulatory Visit: Payer: Self-pay

## 2019-04-02 DIAGNOSIS — D0512 Intraductal carcinoma in situ of left breast: Secondary | ICD-10-CM | POA: Diagnosis present

## 2019-04-02 DIAGNOSIS — Z8509 Personal history of malignant neoplasm of other digestive organs: Secondary | ICD-10-CM | POA: Insufficient documentation

## 2019-04-02 DIAGNOSIS — Z95828 Presence of other vascular implants and grafts: Secondary | ICD-10-CM

## 2019-04-02 DIAGNOSIS — Z452 Encounter for adjustment and management of vascular access device: Secondary | ICD-10-CM | POA: Diagnosis present

## 2019-04-02 MED ORDER — HEPARIN SOD (PORK) LOCK FLUSH 100 UNIT/ML IV SOLN
500.0000 [IU] | Freq: Once | INTRAVENOUS | Status: AC
  Administered 2019-04-02: 500 [IU] via INTRAVENOUS

## 2019-04-02 MED ORDER — SODIUM CHLORIDE 0.9% FLUSH
10.0000 mL | INTRAVENOUS | Status: DC | PRN
  Administered 2019-04-02: 10 mL via INTRAVENOUS
  Filled 2019-04-02: qty 10

## 2019-05-05 ENCOUNTER — Other Ambulatory Visit: Payer: Self-pay

## 2019-05-05 ENCOUNTER — Inpatient Hospital Stay: Payer: Medicare Other | Attending: Internal Medicine

## 2019-05-05 VITALS — Temp 97.6°F | Resp 18

## 2019-05-05 DIAGNOSIS — Z452 Encounter for adjustment and management of vascular access device: Secondary | ICD-10-CM | POA: Insufficient documentation

## 2019-05-05 DIAGNOSIS — D0512 Intraductal carcinoma in situ of left breast: Secondary | ICD-10-CM | POA: Insufficient documentation

## 2019-05-05 DIAGNOSIS — Z95828 Presence of other vascular implants and grafts: Secondary | ICD-10-CM

## 2019-05-05 MED ORDER — HEPARIN SOD (PORK) LOCK FLUSH 100 UNIT/ML IV SOLN
500.0000 [IU] | Freq: Once | INTRAVENOUS | Status: AC
  Administered 2019-05-05: 500 [IU] via INTRAVENOUS

## 2019-05-05 MED ORDER — SODIUM CHLORIDE 0.9% FLUSH
10.0000 mL | INTRAVENOUS | Status: DC | PRN
  Administered 2019-05-05: 10 mL via INTRAVENOUS
  Filled 2019-05-05: qty 10

## 2019-06-11 NOTE — Progress Notes (Signed)
[facility]  [address], [address] [place], [place] [address] Phone: [phone]  Fax: [phone]   Clinic Day:  06/16/2019  Referring physician: Garald Balding*  Chief Complaint: Connie Olsen is a 73 y.o. female with a history of stage II gallbladder cancer, left breast DCIS, and end-stage renal disease on dialysis who is seen for a new patient assessment.   HPI:  The patient was noted to have a gall bladder mass on abdomen and pelvis CT on 11/05/2013.  MRI of the abdomen on 01/06/2014 raised concern for gall bladder cancer as well as bile duct stricture.  Abdomen and pelvis CT on 04/13/2014 showed a soft tissue mass in the gallbladder wall was worrisome for malignancy. There was no discrete hepatic mass identified. There was interval decompression of the biliary system. There was a cystic structure posterior to the carina that did not have any discrete solid components and could have been a bronchogenic cyst.   She underwent laparoscopic cholecystectomy and liver wedge biopsy by Dr Mariah Milling at [facility] on 04/15/2014.    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  Tumor invaded perimuscular connective tissue with no extension beyond the serosa or into the liver.  Margins were uninvolved (closest 0.5 mm).  There is no lymphovascular or perineural invasion.  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  One lymph node was negative.  The logic stage was pT2 pN0.   Patient had a colonoscopy on 05/08/2016.  Her most recent upper endoscopy was on 05/23/2017.    The patient was initially seen in the hematology clinic by Dr. Dyann Kief on 11/28/2016 for recurrent episodes of AV-fistula/graft thrombosis.   Thrombophilia work-up was not recommended.  Anticardiolipin antibody IgM was > 150 on 11/28/2016 and 06/06/2017.  Beta-2 glycoprotein was > 150 on  11/28/2016 and 06/06/2017.  Lupus anticoagulant panel was + on 12/24/2016 and 06/06/2017.   Bone density on 11/11/2017 revealed osteopenia with a T score of -1.40 at the right neck femur.   Abdominal ultrasound on 02/13/2018 revealed a prior cholecystectomy, and no biliary ductal dilation. There were a small echogenic kidneys compatible with chronic medical renal disease. There was a right kidney upper pole benign renal cyst measuring up to 4.2 cm. There was non-obstructing calculi in the lower pole of the left kidney measuring up to 0.6 cm.   The patient was last seen in the medical oncology clinic on 06/03/2018 by Dr. Rogue Bussing. At that time, the patient continued to receive dialysis through permacath left chest wall. She previously had failed multiple grafts/fistulas of the upper extremity's. Exam revealed no new clots or DVTs or PEs. She continued aspirin 325 mg a day.   She has a history of DCIS left breast (11/2014).  She underwent left breast excisional biopsy on 11/18/2014.  Pathology revealed atypical papilloma (3.5 cm) with multiple foci of highly suspicious for involvement by carcinoma in situ and extending to margins.  She underwent left breast excision and reexcision's on 11/29/2014.  There was grade 1 DCIS.  DCIS was ER positive and PR positive.  She underwent re-excision for positive margins on 12/23/2014.  Final pathology revealed positive medial and posterior margins.  Medial margin was positive for atypical papilloma,.  Posterior margin was positive for microscopic foci of DCIS and residual papilloma.  Re-excision was recommended by the tumor board.  She underwent left medial posterior margin reexcision on 07/21/2015.  There was  residual DCIS and medial margin with margins positive for DCIS.  Discussions were held regarding persistently positive margins and consideration of mastectomy.  Patient declined.  She declined further excision.  Radiation was recommended.  She declined.  She has  remained on Arimidex 1 mg a day  She is followed by Dr. Deitra Mayo at [facility]. Bilateral mammogram on 08/12/2018 showed no mammographic evidence of reccurrent malignancy.   Patient's bone health has been monitored.  She has been felt high risk for osteoporosis given her end-stage renal disease, early menopause (age 65) and Arimidex.  Baseline bone density on 10/06/2015 was normal.  There was osteoporosis in the forearm.  Repeat bone density study 2019 revealed osteoporosis with a decrease of 8% in the radius and 11.9% in the hip.  She is not a candidate for Zometa given her chronic kidney disease on dialysis.  She saw Dr. Mariah Milling on 09/16/2018.  She continues dialysis 3 times a week. She has reported a 25 pound weight loss since 2019.  She noted low grade fevers (99.9) after dialysis, decreased appetite, and night sweats. She denied any chest pain and shortness of breath. She denied abdominal pain or changes in bowel movements.  CA 19-9 was 495 (up from 143 since 2019).  Chest, abdomen, and pelvis CT was stable. Plan was for follow-up in 3 months for repeat tumor markers and scan.   Abdomen and pelvis CT on 12/30/2018 showed no definite metastatic disease within the chest, abdomen, or pelvis. There was minimally increased conspicuity of a hypodense left hepatic lesion, favor benign in etiology such as focal fat given relative stability since 2017. Attention on follow up imaging was recommended.  There was no significant change in the mildy prominent aortocaval lymph node.   Echo on 03/27/2019 revealed ejection fraction > 55%.   Symptomatically, she feels "bad". Patient reports "some days are good and other days are bad".  She describes back spasms.  Movement increases pain. The patient took Diazepam 10 mg with an Advil this morning. She feels like she needs a massage therapist. She notes a steady unitentional weight loss of 30 pounds over the past 1.5 years. Her baseline weight is 168-170 pounds. She is  eating less. She does not go out much. Her son get the groceries.  She has off and on nausea. She notes "some" emesis.  She has alternating constipation and diarrhea which she attributes to IBS. She has numbness in her fingers. She has joint pain in her hands related to arthritis and bilateral knee pain related to osteoarthritis. Patient has a herniated disc in neck. She reports off and on headaches and chest pains. She has a constant cough with plenty of phlegm. She has Barrett's esophagus and notes dysphagia; she is on a surveillance program.   She started dialysis in 11/2012. Since childhood she experienced constant fevers and health complications. She was diagnosed with IBS and ulcerative colitis in her early teens. As a young adult her kidney function began to decline. She has had multiple lithotripsy's since the 1990's. She was diagnosed with type II diabetes in 1999, and since then her kidney function has continued to decline. She has had multiple fistulas/grafts. She notes pain in the right arm because it feels like "the stent is trying to come out".  She has poor circulation in her bilateral lower extremities. She denies any pulmonary emboli or DVT. She notes difficulty obtaining an accurate blood pressure secondary to her graft/fistula issues.  Her blood pressure is often checked  in her lower extremities.    She has an upcoming appointment with Dr Mariah Milling.  She remains on Arimidex 1 mg a day.    Past Medical History:  Diagnosis Date  . Barrett's esophagus without dysplasia   . Cholangiocarcinoma ([place])   . Chronic kidney disease   . 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 Endometrial thickening on ultra sound 04/19/2015  . History of ulcerative colitis   . History of uterine fibroid   . Osteoporosis     Past Surgical History:  Procedure Laterality Date  . CHOLECYSTECTOMY    . colonoscopy  05/08/2016  . LAPAROSCOPIC PARTIAL HEPATECTOMY    . MASTECTOMY PARTIAL / LUMPECTOMY     . UPPER GI ENDOSCOPY  05/08/2016    Family History  Problem Relation Age of Onset  . Breast cancer Mother     Social History:  reports that she has never smoked. She has never used smokeless tobacco. She reports that she does not drink alcohol or use drugs. She is from [place]. She has had multiple jobs. Her husband worked in [employer] so they moved around a lot.  She lives in [place].  The patient is alone today.  Allergies:  Allergies  Allergen Reactions  . Aspirin Other (See Comments)    Ulcerative colitis: UNABLE TO TOLERATE IF NOT ENTERIC COATED Ulcerative colitis: UNABLE TO TOLERATE IF NOT ENTERIC COATED  . Brassica Napus Seed Oil Other (See Comments)  . Naltrexone Other (See Comments)  . Librax  [Chlordiazepoxide-Clidinium] Anxiety    agitation agitation  . Povidone Iodine Itching and Rash  . Povidone-Iodine Itching and Rash  . Tape Itching    Current Medications: Current Outpatient Medications  Medication Sig Dispense Refill  . acetaminophen (TYLENOL) 500 MG tablet Take 500 mg by mouth every 6 (six) hours as needed for moderate pain.     Marland Kitchen amitriptyline (ELAVIL) 10 MG tablet Take 10 mg by mouth at bedtime.     Marland Kitchen amoxicillin (AMOXIL) 500 MG tablet TAKE 4 TABLETS BY MOUTH ONCE FOR 1 DOSE. TAKE 30 60 MINUTES PRIOR TO DENTAL PROCEDURE    . anastrozole (ARIMIDEX) 1 MG tablet Take 1 mg by mouth daily.    Marland Kitchen aspirin EC 81 MG tablet Take 81 mg by mouth daily.     . B Complex-C-Folic Acid (DIALYVITE TABLET) TABS Take 1 tablet by mouth daily.     . Bismuth Subsalicylate 782 NF/62ZH SUSP Take 15 mLs by mouth daily. As directed as needed    . Cholecalciferol (VITAMIN D) 50 MCG (2000 UT) CAPS Take by mouth 3 (three) times a week. Dialysis    . Darbepoetin Alfa (ARANESP) 100 MCG/0.5ML SOSY injection Inject 100 mcg into the vein every 7 (seven) days.     . diazepam (VALIUM) 10 MG tablet TAKE 1 TABLET BY MOUTH 1 HOUR PRIOR TO DENTAL TREATMENT    . ferric citrate (AURYXIA) 1 GM 210  MG(Fe) tablet Take 420 mg by mouth 3 (three) times daily.     . folic acid-vitamin b complex-vitamin c-selenium-zinc (DIALYVITE) 3 MG TABS tablet Take 1 tablet by mouth daily.    . Heparin Lock Flush (HEPARIN FLUSH, PORCINE,) 100 UNIT/ML injection 100U/ml Flush port with 73ml every 30 days.    Marland Kitchen ibuprofen (ADVIL,MOTRIN) 200 MG tablet Take 200 mg by mouth every 6 (six) hours as needed for mild pain.    Marland Kitchen lidocaine-prilocaine (EMLA) cream Apply to access before dialysis    . metoprolol tartrate (LOPRESSOR) 25 MG  tablet 12.5 mg 2 (two) times daily.   1  . omeprazole (PRILOSEC) 20 MG capsule TAKE ONE CAPSULE BY MOUTH EVERY DAY 30-45 MINUTES BEFORE BREAKFAST  8  . SENSIPAR 60 MG tablet TAKE 1 TABLET BY MOUTH ONCE A DAY WITH MEALS  11  . sodium chloride 0.9 % injection 34ml of NS flush into port once every 30 days. Follow with heparin flush    . midodrine (PROAMATINE) 10 MG tablet Take by mouth.     No current facility-administered medications for this visit.    Facility-Administered Medications Ordered in Other Visits  Medication Dose Route Frequency Provider Last Rate Last Dose  . heparin lock flush 100 unit/mL  500 Units Intravenous Once Corcoran, Melissa C, MD      . sodium chloride flush (NS) 0.9 % injection 10 mL  10 mL Intravenous PRN Lequita Asal, MD        Review of Systems  Constitutional: Positive for weight loss (unintentional 30 pounds over 1.5 years;  baseline 168-170 pounds). Negative for chills, diaphoresis, fever and malaise/fatigue.       She describes "good and bad days".  HENT: Negative for congestion, ear pain, hearing loss, nosebleeds, sinus pain and sore throat.   Eyes: Negative for blurred vision and double vision.  Respiratory: Positive for cough (constant) and sputum production (phlegm). Negative for hemoptysis and shortness of breath.   Cardiovascular: Positive for chest pain (off and on). Negative for palpitations and leg swelling.  Gastrointestinal: Positive for  diarrhea (on and off), nausea (on and off) and vomiting. Negative for abdominal pain, blood in stool, constipation (on and off) and melena.       Eating less. IBS; ulcerative colitis. Barrett's esophagus. Dysphagia on surveillance program.  Genitourinary: Negative for dysuria, hematuria and urgency.       Began dialysis 11/2012.  Musculoskeletal: Positive for back pain (spasms) and joint pain (bilateral knees, hands; arthritis). Negative for myalgias and neck pain (herniated disc).  Skin: Negative for itching and rash.  Neurological: Positive for sensory change (numbness in fingers and hands) and headaches (off and on). Negative for dizziness, tingling, speech change, focal weakness and weakness.       Ambulate by cane or walker.  Endo/Heme/Allergies: Does not bruise/bleed easily.       Type II diabetes.  Psychiatric/Behavioral: Negative.  Negative for depression and memory loss. The patient is not nervous/anxious and does not have insomnia.   All other systems reviewed and are negative.  Performance status (ECOG): 2  Vitals Pulse 85, temperature 97.6 F (36.4 C), temperature source Tympanic, resp. rate 20, weight 173 lb 1 oz (78.5 kg), SpO2 98 %.   Physical Exam  Constitutional: She is oriented to person, place, and time. She appears well-developed and well-nourished. No distress.  Patient needs assistance on and off exam room table.  She has a rolling walker by her side.  HENT:  Head: Normocephalic and atraumatic.  Mouth/Throat: Oropharynx is clear and moist. No oropharyngeal exudate.  Shoulder length gray hair. Mask.  Eyes: Pupils are equal, round, and reactive to light. Conjunctivae and EOM are normal. No scleral icterus.  Blue eyes.  Neck: Normal range of motion. Neck supple. No JVD present.  Cardiovascular: Normal rate, regular rhythm and normal heart sounds.  No murmur heard. Pulmonary/Chest: Effort normal and breath sounds normal. No respiratory distress. She has no wheezes.  She has no rales. She exhibits no tenderness.  Intermittent cough.  Left chest dressing.  Right sided port-a-cath.  Abdominal: Soft. Bowel sounds are normal. She exhibits no distension and no mass. There is abdominal tenderness. There is no rebound and no guarding.  Musculoskeletal: Normal range of motion.        General: No edema.     Lumbar back: She exhibits tenderness (paravertebral).  Lymphadenopathy:    She has no cervical adenopathy.    She has no axillary adenopathy.       Right: No inguinal and no supraclavicular adenopathy present.       Left: No inguinal and no supraclavicular adenopathy present.  Neurological: She is alert and oriented to person, place, and time.  Skin: Skin is warm and dry. No rash noted. She is not diaphoretic. No pallor.  Psychiatric: She has a normal mood and affect. Her behavior is normal. Judgment and thought content normal.  Nursing note and vitals reviewed.   No visits with results within 3 Day(s) from this visit.  Latest known visit with results is:  Office Visit on 11/14/2017  Component Date Value Ref Range Status  . Atopobium vaginae 11/14/2017 Low - 0  Score Final  . BVAB 2 11/14/2017 Low - 0  Score Final  . Megasphaera 1 11/14/2017 Low - 0  Score Final   Comment: Calculate total score by adding the 3 individual bacterial vaginosis (BV) marker scores together.  Total score is interpreted as follows: Total score 0-1: Indicates the absence of BV. Total score   2: Indeterminate for BV. Additional clinical                  data should be evaluated to establish a                  diagnosis. Total score 3-6: Indicates the presence of BV. This test was developed and its performance characteristics determined by [facility].  It has not been cleared or approved by the Food and Drug Administration.  The FDA has determined that such clearance or approval is not necessary.   . Candida albicans, NAA 11/14/2017 Negative  Negative Final  . Candida  glabrata, NAA 11/14/2017 Negative  Negative Final   Comment: This test was developed and its performance characteristics determined by [facility].  It has not been cleared or approved by the Food and Drug Administration.  The FDA has determined that such clearance or approval is not necessary.   Dalbert Batman vag by NAA 11/14/2017 Negative  Negative Final    Assessment:  Connie Olsen is a 73 y.o. female with a history of stage II gallbladder cancer, left breast DCIS, and ESRD on dialysis.   She has a history of gallbladder cancer s/p laparoscopic cholecystectomy and liver wedge biopsy by Dr Mariah Milling at [facility] on 04/15/2014.  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  Tumor invaded perimuscular connective tissue with no extension beyond the serosa or into the liver.  Margins were uninvolved (closest 0.5 mm).  There is no lymphovascular or perineural invasion.  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  One lymph node was negative.  Pathologic stage was pT2 pN0.   Abdomen and pelvis CT on 12/30/2018 showed no definite metastatic disease within the chest, abdomen, or pelvis. There was minimally increased conspicuity of a hypodense left hepatic lesion, favor benign in etiology such as focal fat given relative stability since 2017. Attention on follow up imaging was recommended.  There was no significant change in the mildy  prominent aortocaval lymph node.   CA19-9 has been followed: 87 on 10/15/2014, 165 on 01/21/2015, 92 on 03/22/2015, 131 on 09/20/2015, 168 on 03/20/2016, 89 on 09/18/2016, 143 on 09/24/2017, 495 on 09/16/2018 and 140 on 12/30/2018.  She has a history of DCIS left breast (11/2014) s/p multiple excisions with + margins.  DCIS was ER and PR positive.  She declined further surgery or radiation.  She remains on Armidex.  She is followed by Dr. Deitra Mayo at [facility].  Bilateral  mammogram on 08/12/2018 showed no mammographic evidence of reccurrent malignancy.   She is on dialysis 3 times a week.   She has had recurrent episodes of AV-fistula/graft thrombosis.  She has been seen in the hematology clinic by Dr. Dyann Kief.   Thrombophilia work-up was not recommended.  Anticardiolipin antibody IgM was > 150 on 11/28/2016 and 06/06/2017.  Beta-2 glycoprotein was > 150 on 11/28/2016 and 06/06/2017.  Lupus anticoagulant panel was + on 12/24/2016 and 06/06/2017.   Bone density on 11/11/2017 revealed osteopenia with a T score of -1.40 at the right neck femur and osteoporosis with a T-score of -3.3 in the left radius. She is not a candidate for Zometa given her ESRD.  She has IBS.  Last colonoscopy was on 05/08/2016.  She has Barrett's esophagus. Last EGD was on 05/23/2017.  Echo on 03/27/2019 revealed ejection fraction > 55%.   Symptomatically, she has "good and bad days".  She notes a 30 pound unintentional weight loss over the past 1.5 years. She is eating less.  She has off and on nausea and "some" emesis.  She has alternating constipation and diarrhea which she attributes to IBS.   Plan: 1.   Review entire medical history, diagnosis and management of gall bladder cancer and DCIS. 2.   Stage II gallbladder cancer   Review diagnosis and pathology.  Review interim scans.  Patient follows with Dr Mariah Milling at [facility]. 3.   Left breast DCIS  Review initial diagnosis and multiple surgeries with positive margins.  Patient declined radiation.  Bilateral mammogram on 08/12/2018 revealed no recurrence.   She continues Arimidex.  She is followed by Dr Deitra Mayo at [facility]. 4.   Recurrent AV-fistula/graft thrombosis  Patient with + lupus anticoagulant, anti-cardiolipin antibodies, beta-2 glycoprotein antibodies.  Continue aspirin.  Discuss follow-up with Dr. Dyann Kief. 5.   Port-a cath maintenance  Patient wishes only to have port maintenance in this clinic. 6.   RTC every 6 weeks x 1  year for port flush 7.   RTC in 1 year for MD assessment, +/- labs, and port flush.  I discussed the assessment and treatment plan with the patient.  The patient was provided an opportunity to ask questions and all were answered.  The patient agreed with the plan and demonstrated an understanding of the instructions.  The patient was advised to call back if the symptoms worsen or if the condition fails to improve as anticipated.  I provided 32 minutes of face-to-face time during this this encounter and > 50% was spent counseling as documented under my assessment and plan.    Melissa C. Mike Gip, MD, PhD    06/16/2019, 12:06 PM  I, Selena Batten, am acting as scribe for [employer]. Mike Gip, MD, PhD.  I, Melissa C. Mike Gip, MD, have reviewed the above documentation for accuracy and completeness, and I agree with the above.

## 2019-06-16 ENCOUNTER — Ambulatory Visit
Admission: EM | Admit: 2019-06-16 | Discharge: 2019-06-16 | Disposition: A | Discharge status: Home / Self Care | Payer: Medicare Other | Attending: Family Medicine | Admitting: Family Medicine

## 2019-06-16 ENCOUNTER — Ambulatory Visit: Payer: Medicare Other | Admitting: Internal Medicine

## 2019-06-16 ENCOUNTER — Telehealth: Payer: Self-pay

## 2019-06-16 ENCOUNTER — Inpatient Hospital Stay (HOSPITAL_BASED_OUTPATIENT_CLINIC_OR_DEPARTMENT_OTHER): Payer: Medicare Other | Admitting: Hematology and Oncology

## 2019-06-16 ENCOUNTER — Ambulatory Visit: Payer: Medicare Other

## 2019-06-16 ENCOUNTER — Other Ambulatory Visit: Payer: Self-pay

## 2019-06-16 ENCOUNTER — Inpatient Hospital Stay: Payer: Medicare Other | Attending: Hematology and Oncology

## 2019-06-16 VITALS — HR 85 | Temp 97.6°F | Resp 20 | Wt 173.1 lb

## 2019-06-16 DIAGNOSIS — Z992 Dependence on renal dialysis: Secondary | ICD-10-CM | POA: Diagnosis not present

## 2019-06-16 DIAGNOSIS — N186 End stage renal disease: Secondary | ICD-10-CM | POA: Diagnosis not present

## 2019-06-16 DIAGNOSIS — E119 Type 2 diabetes mellitus without complications: Secondary | ICD-10-CM | POA: Insufficient documentation

## 2019-06-16 DIAGNOSIS — Z79811 Long term (current) use of aromatase inhibitors: Secondary | ICD-10-CM | POA: Insufficient documentation

## 2019-06-16 DIAGNOSIS — M545 Low back pain, unspecified: Secondary | ICD-10-CM

## 2019-06-16 DIAGNOSIS — K589 Irritable bowel syndrome without diarrhea: Secondary | ICD-10-CM | POA: Diagnosis not present

## 2019-06-16 DIAGNOSIS — C23 Malignant neoplasm of gallbladder: Secondary | ICD-10-CM

## 2019-06-16 DIAGNOSIS — Z803 Family history of malignant neoplasm of breast: Secondary | ICD-10-CM | POA: Diagnosis not present

## 2019-06-16 DIAGNOSIS — Z170 Estrogen receptor positive status [ER+]: Secondary | ICD-10-CM | POA: Diagnosis not present

## 2019-06-16 DIAGNOSIS — Z9049 Acquired absence of other specified parts of digestive tract: Secondary | ICD-10-CM | POA: Insufficient documentation

## 2019-06-16 DIAGNOSIS — Z8509 Personal history of malignant neoplasm of other digestive organs: Secondary | ICD-10-CM | POA: Diagnosis not present

## 2019-06-16 DIAGNOSIS — Z791 Long term (current) use of non-steroidal anti-inflammatories (NSAID): Secondary | ICD-10-CM | POA: Insufficient documentation

## 2019-06-16 DIAGNOSIS — Z7982 Long term (current) use of aspirin: Secondary | ICD-10-CM | POA: Diagnosis not present

## 2019-06-16 DIAGNOSIS — M8580 Other specified disorders of bone density and structure, unspecified site: Secondary | ICD-10-CM | POA: Insufficient documentation

## 2019-06-16 DIAGNOSIS — D0512 Intraductal carcinoma in situ of left breast: Secondary | ICD-10-CM | POA: Diagnosis present

## 2019-06-16 DIAGNOSIS — Z95828 Presence of other vascular implants and grafts: Secondary | ICD-10-CM

## 2019-06-16 DIAGNOSIS — Z79899 Other long term (current) drug therapy: Secondary | ICD-10-CM | POA: Diagnosis not present

## 2019-06-16 DIAGNOSIS — D6862 Lupus anticoagulant syndrome: Secondary | ICD-10-CM | POA: Diagnosis not present

## 2019-06-16 MED ORDER — BACLOFEN 5 MG PO TABS: 2.5000 mg | ORAL_TABLET | Freq: Two times a day (BID) | ORAL | 0 refills | Status: DC | PRN

## 2019-06-16 MED ORDER — TRAMADOL HCL 50 MG PO TABS: 50.0000 mg | ORAL_TABLET | Freq: Two times a day (BID) | ORAL | 0 refills | Status: DC | PRN

## 2019-06-16 MED ORDER — SODIUM CHLORIDE 0.9% FLUSH
10.0000 mL | INTRAVENOUS | Status: DC | PRN
  Administered 2019-06-16: 10 mL via INTRAVENOUS
  Filled 2019-06-16: qty 10

## 2019-06-16 MED ORDER — HEPARIN SOD (PORK) LOCK FLUSH 100 UNIT/ML IV SOLN
500.0000 [IU] | Freq: Once | INTRAVENOUS | Status: AC
  Administered 2019-06-16: 500 [IU] via INTRAVENOUS
  Filled 2019-06-16: qty 5

## 2019-06-16 NOTE — ED Provider Notes (Signed)
[facility]    CSN: [id number] Arrival date & time: 06/16/19  1314  History   Chief Complaint Chief Complaint  Patient presents with  . Back Pain   HPI  73 year old female presents with back pain.  Patient has an extensive past medical history including end-stage renal disease on hemodialysis.  Patient reports that she has had back pain since Friday.  No recent fall, trauma, injury.  Bilateral, lower thoracic/upper lumbar pain.  Worse when she bends over.  She states that she feels unsteady on her feet as if she is going to fall.  Patient has taken Valium and ibuprofen without resolution.  Pain is currently moderate in severity.  No relieving factors.  No other associated symptoms.  No other complaints.  PMH, Surgical Hx, Family Hx, Social History reviewed and updated as below.  Past Medical History:  Diagnosis Date  . Barrett's esophagus without dysplasia   . Cholangiocarcinoma ([place])   . Chronic kidney disease   . 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 Endometrial thickening on ultra sound 04/19/2015  . History of ulcerative colitis   . History of uterine fibroid   . Osteoporosis    Patient Active Problem List   Diagnosis Date Noted  . Primary hypercoagulable state ([place]) 05/21/2017  . Endometrial thickening on ultrasound 04/19/2015  . H/O female genital system disorder 03/08/2015  . H/O chronic ulcerative colitis 03/08/2015  . Poor venous access 03/04/2015  . BP (high blood pressure) 11/16/2014  . Adiposity 11/16/2014  . 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 A-fib ([place]) 02/04/2014  . End stage kidney disease ([place]) 02/04/2014   Past Surgical History:  Procedure Laterality Date  . CHOLECYSTECTOMY    . colonoscopy  05/08/2016  . LAPAROSCOPIC PARTIAL HEPATECTOMY    . MASTECTOMY PARTIAL / LUMPECTOMY    . UPPER GI ENDOSCOPY  05/08/2016   OB History    Gravida  2   Para  2   Term   2   Preterm      AB      Living  2     SAB      TAB      Ectopic      Multiple      Live Births             Home Medications    Prior to Admission medications   Medication Sig Start Date End Date Taking? Authorizing Provider  acetaminophen (TYLENOL) 500 MG tablet Take 500 mg by mouth every 6 (six) hours as needed for moderate pain.    Yes [provider]  amitriptyline (ELAVIL) 10 MG tablet Take 10 mg by mouth at bedtime.  06/27/17  Yes [provider]  amoxicillin (AMOXIL) 500 MG tablet TAKE 4 TABLETS BY MOUTH ONCE FOR 1 DOSE. TAKE 30 60 MINUTES PRIOR TO DENTAL PROCEDURE 04/29/19  Yes [provider]  anastrozole (ARIMIDEX) 1 MG tablet Take 1 mg by mouth daily.   Yes [provider]  aspirin EC 81 MG tablet Take 81 mg by mouth daily.    Yes [provider]  B Complex-C-Folic Acid (DIALYVITE TABLET) TABS Take 1 tablet by mouth daily.  07/14/14  Yes [provider]  Bismuth Subsalicylate 561 BP/79KF SUSP Take 15 mLs by mouth daily. As directed as needed   Yes [provider]  Cholecalciferol (VITAMIN D) 50 MCG (2000 UT) CAPS Take by mouth 3 (  three) times a week. Dialysis 04/27/19 04/25/20 Yes [provider]  Darbepoetin Alfa (ARANESP) 100 MCG/0.5ML SOSY injection Inject 100 mcg into the vein every 7 (seven) days.  03/05/14  Yes [provider]  diazepam (VALIUM) 10 MG tablet TAKE 1 TABLET BY MOUTH 1 HOUR PRIOR TO DENTAL TREATMENT 01/02/19  Yes [provider]  ferric citrate (AURYXIA) 1 GM 210 MG(Fe) tablet Take 420 mg by mouth 3 (three) times daily.  09/05/18  Yes [provider]  folic acid-vitamin b complex-vitamin c-selenium-zinc (DIALYVITE) 3 MG TABS tablet Take 1 tablet by mouth daily.   Yes [provider]  Heparin Lock Flush (HEPARIN FLUSH, PORCINE,) 100 UNIT/ML injection 100U/ml Flush port with 29ml every 30 days. 04/12/15  Yes [provider]   lidocaine-prilocaine (EMLA) cream Apply to access before dialysis 06/04/14  Yes [provider]  metoprolol tartrate (LOPRESSOR) 25 MG tablet 12.5 mg 2 (two) times daily.  08/14/16  Yes [provider]  midodrine (PROAMATINE) 10 MG tablet Take by mouth. 08/10/15  Yes [provider]  omeprazole (PRILOSEC) 20 MG capsule TAKE ONE CAPSULE BY MOUTH EVERY DAY 30-45 MINUTES BEFORE BREAKFAST 11/09/16  Yes [provider]  SENSIPAR 60 MG tablet TAKE 1 TABLET BY MOUTH ONCE A DAY WITH MEALS 03/11/15  Yes [provider]  sodium chloride 0.9 % injection 51ml of NS flush into port once every 30 days. Follow with heparin flush 04/12/15  Yes [provider]  Baclofen 5 MG TABS Take 2.5 mg by mouth 2 (two) times daily as needed (Spasm, back pain). 06/16/19   Coral Spikes, DO  traMADol (ULTRAM) 50 MG tablet Take 1 tablet (50 mg total) by mouth every 12 (twelve) hours as needed. 06/16/19   Coral Spikes, DO    Family History Family History  Problem Relation Age of Onset  . Breast cancer Mother     Social History Social History   Tobacco Use  . Smoking status: Never Smoker  . Smokeless tobacco: Never Used  Substance Use Topics  . Alcohol use: No    Alcohol/week: 0.0 standard drinks  . Drug use: No     Allergies   Aspirin, Brassica napus seed oil, Naltrexone, Librax  [chlordiazepoxide-clidinium], Povidone iodine, Povidone-iodine, and Tape   Review of Systems Review of Systems  Constitutional: Negative for fever.  Musculoskeletal: Positive for back pain.   Physical Exam Triage Vital Signs ED Triage Vitals  Enc Vitals Group     BP 06/16/19 1352 (!) 81/60     Pulse Rate 06/16/19 1352 69     Resp 06/16/19 1352 16     Temp 06/16/19 1352 98.4 F (36.9 C)     Temp Source 06/16/19 1352 Oral     SpO2 06/16/19 1352 98 %     Weight 06/16/19 1350 170 lb (77.1 kg)     Height 06/16/19 1350 4\' 11"  (1.499 m)     Head Circumference --      Peak Flow --       Pain Score 06/16/19 1347 5     Pain Loc --      Pain Edu? --      Excl. in [place]? --    Updated Vital Signs BP (!) 81/60 (BP Location: Left Arm)   Pulse 69   Temp 98.4 F (36.9 C) (Oral)   Resp 16   Ht 4\' 11"  (1.499 m)   Wt 77.1 kg   SpO2 98%   BMI 34.34 kg/m  Visual Acuity Right Eye Distance:   Left Eye Distance:   Bilateral Distance:    Right Eye Near:   Left Eye Near:    Bilateral Near:     Physical Exam Vitals signs and nursing note reviewed.  Constitutional:      General: She is not in acute distress.    Appearance: Normal appearance. She is not ill-appearing.  HENT:     Head: Normocephalic and atraumatic.  Eyes:     General:        Right eye: No discharge.        Left eye: No discharge.     Conjunctiva/sclera: Conjunctivae normal.  Cardiovascular:     Rate and Rhythm: Rhythm irregular.     Heart sounds: No murmur.  Musculoskeletal:     Comments: Decreased range of motion of the thoracic and lumbar spine.  Paraspinal musculature tenderness of the lower thoracic spine and upper lumbar spine.  Neurological:     Mental Status: She is alert.  Psychiatric:        Mood and Affect: Mood normal.        Behavior: Behavior normal.    UC Treatments / Results  Labs (all labs ordered are listed, but only abnormal results are displayed) Labs Reviewed - No data to display  EKG   Radiology Dg Cervical Spine Complete  Result Date: 06/16/2019 CLINICAL DATA:  Acute neck pain. EXAM: CERVICAL SPINE - COMPLETE 4+ VIEW COMPARISON:  None. FINDINGS: Mild grade 1 anterolisthesis C4-5 is noted. Severe degenerative disc disease is noted at this level. Moderate degenerative disc disease is noted at C5-6 and C6-7 with anterior osteophyte formation. Mild right-sided neural foraminal stenosis is noted at C5-6 secondary to uncovertebral spurring. IMPRESSION: Multilevel degenerative disc disease. Mild right-sided neural foraminal stenosis is noted secondary to uncovertebral  spurring. No acute abnormality seen in the cervical spine. Electronically Signed   By: Marijo Conception M.D.   On: 06/16/2019 15:25   Dg Thoracic Spine 2 View  Result Date: 06/16/2019 CLINICAL DATA:  Upper back pain. EXAM: THORACIC SPINE 2 VIEWS COMPARISON:  None. FINDINGS: No fracture or spondylolisthesis is noted. Multilevel degenerative changes noted in the lower thoracic spine. Atherosclerosis of abdominal aorta is noted. IMPRESSION: Multilevel degenerative disc disease seen in lower thoracic spine. No acute abnormality seen in the thoracic spine. Aortic Atherosclerosis (ICD10-I70.0). Electronically Signed   By: Marijo Conception M.D.   On: 06/16/2019 15:21   Dg Lumbar Spine Complete  Result Date: 06/16/2019 CLINICAL DATA:  Low back pain. EXAM: LUMBAR SPINE - COMPLETE 4+ VIEW COMPARISON:  None. FINDINGS: No fracture or spondylolisthesis is noted. Moderate degenerative disc disease is noted at L1-2, L2-3 and L3-4. Diffuse osteopenia is noted. Atherosclerosis of abdominal aorta is noted. IMPRESSION: Moderate multilevel degenerative disc disease. No acute abnormality seen in the lumbar spine. Electronically Signed   By: Marijo Conception M.D.   On: 06/16/2019 15:23    Procedures Procedures (including critical care time)  Medications Ordered in UC Medications - No data to display  Initial Impression / Assessment and Plan / UC Course  I have reviewed the triage vital signs and the nursing notes.  Pertinent labs & imaging results that were available during my care of the patient were reviewed by me and considered in my medical decision making (see chart for details).    73 year old female presents with back pain.  Has known osteoporosis.  X-rays negative for fracture.  Tramadol and low-dose baclofen as directed.  Supportive care.  Final Clinical Impressions(s) / UC Diagnoses   Final diagnoses:  Acute bilateral low back pain without sciatica     Discharge Instructions     Rest.   Medication as prescribed.  Take care  Dr. Lacinda Axon    ED Prescriptions    Medication Sig Dispense Auth. Provider   traMADol (ULTRAM) 50 MG tablet Take 1 tablet (50 mg total) by mouth every 12 (twelve) hours as needed. 10 tablet Curtiss Mahmood G, DO   Baclofen 5 MG TABS Take 2.5 mg by mouth 2 (two) times daily as needed (Spasm, back pain). 10 tablet Thersa Salt G, DO     I have reviewed the PDMP during this encounter.   Coral Spikes, [place] 06/16/19 1606

## 2019-06-16 NOTE — Telephone Encounter (Signed)
Upon checking VS during visit, patient advised me that she has an extremely hard time with get a BP. Reports her dialysis nurses have extremely difficult time obtaining BP readings and have to try multiple times. The highest reading I could get was 68/28 in the right lower leg. Patient states her normal is 80-90 range /50-60 range. Informed Dr. Mike Gip of multiple attempts. Patient denies any symptoms at this time regarding BP and feels fine.

## 2019-06-16 NOTE — Discharge Instructions (Signed)
Rest  Medication as prescribed.  Take care  Dr. Zaeden Lastinger  

## 2019-06-16 NOTE — ED Triage Notes (Addendum)
Onset 5 days worst from past 2 days. As per patient taken diazepam in the morning so can able to go for dialysis and ibuprofen.

## 2019-06-16 NOTE — Progress Notes (Signed)
Previous Dr. Rogue Bussing patient. C/O back spasms for awhile.

## 2019-06-23 ENCOUNTER — Encounter: Payer: Self-pay | Admitting: Hematology and Oncology

## 2019-06-23 DIAGNOSIS — Z95828 Presence of other vascular implants and grafts: Secondary | ICD-10-CM | POA: Insufficient documentation

## 2019-08-11 ENCOUNTER — Inpatient Hospital Stay: Payer: Medicare Other | Attending: Hematology and Oncology

## 2019-08-11 ENCOUNTER — Inpatient Hospital Stay: Payer: Medicare Other

## 2019-08-11 ENCOUNTER — Other Ambulatory Visit: Payer: Self-pay

## 2019-08-11 DIAGNOSIS — D0512 Intraductal carcinoma in situ of left breast: Secondary | ICD-10-CM | POA: Diagnosis present

## 2019-08-11 DIAGNOSIS — Z8509 Personal history of malignant neoplasm of other digestive organs: Secondary | ICD-10-CM | POA: Insufficient documentation

## 2019-08-11 DIAGNOSIS — Z79811 Long term (current) use of aromatase inhibitors: Secondary | ICD-10-CM | POA: Diagnosis not present

## 2019-08-11 DIAGNOSIS — Z452 Encounter for adjustment and management of vascular access device: Secondary | ICD-10-CM | POA: Insufficient documentation

## 2019-08-11 DIAGNOSIS — Z95828 Presence of other vascular implants and grafts: Secondary | ICD-10-CM

## 2019-08-11 MED ORDER — SODIUM CHLORIDE 0.9% FLUSH
10.0000 mL | INTRAVENOUS | Status: DC | PRN
  Administered 2019-08-11: 11:00:00 10 mL via INTRAVENOUS
  Filled 2019-08-11: qty 10

## 2019-08-11 MED ORDER — HEPARIN SOD (PORK) LOCK FLUSH 100 UNIT/ML IV SOLN
500.0000 [IU] | Freq: Once | INTRAVENOUS | Status: AC
  Administered 2019-08-11: 500 [IU] via INTRAVENOUS

## 2019-09-29 ENCOUNTER — Other Ambulatory Visit: Payer: Self-pay

## 2019-09-29 ENCOUNTER — Inpatient Hospital Stay: Payer: Medicare Other | Attending: Hematology and Oncology

## 2019-09-29 VITALS — Temp 97.9°F

## 2019-09-29 DIAGNOSIS — Z95828 Presence of other vascular implants and grafts: Secondary | ICD-10-CM

## 2019-09-29 DIAGNOSIS — C23 Malignant neoplasm of gallbladder: Secondary | ICD-10-CM | POA: Insufficient documentation

## 2019-09-29 DIAGNOSIS — Z452 Encounter for adjustment and management of vascular access device: Secondary | ICD-10-CM | POA: Diagnosis present

## 2019-09-29 MED ORDER — SODIUM CHLORIDE 0.9% FLUSH
10.0000 mL | INTRAVENOUS | Status: DC | PRN
  Administered 2019-09-29: 12:00:00 10 mL via INTRAVENOUS
  Filled 2019-09-29: qty 10

## 2019-09-29 MED ORDER — HEPARIN SOD (PORK) LOCK FLUSH 100 UNIT/ML IV SOLN
500.0000 [IU] | Freq: Once | INTRAVENOUS | Status: AC
  Administered 2019-09-29: 12:00:00 500 [IU] via INTRAVENOUS
  Filled 2019-09-29: qty 5

## 2019-09-30 DIAGNOSIS — E213 Hyperparathyroidism, unspecified: Secondary | ICD-10-CM | POA: Insufficient documentation

## 2019-09-30 DIAGNOSIS — F32A Depression, unspecified: Secondary | ICD-10-CM | POA: Insufficient documentation

## 2019-09-30 DIAGNOSIS — G629 Polyneuropathy, unspecified: Secondary | ICD-10-CM | POA: Insufficient documentation

## 2019-10-13 DIAGNOSIS — T7840XA Allergy, unspecified, initial encounter: Secondary | ICD-10-CM | POA: Insufficient documentation

## 2019-10-13 DIAGNOSIS — T782XXA Anaphylactic shock, unspecified, initial encounter: Secondary | ICD-10-CM | POA: Insufficient documentation

## 2019-10-15 DIAGNOSIS — R770 Abnormality of albumin: Secondary | ICD-10-CM | POA: Insufficient documentation

## 2019-11-10 ENCOUNTER — Inpatient Hospital Stay: Payer: Medicare Other | Attending: Nurse Practitioner

## 2019-11-10 ENCOUNTER — Other Ambulatory Visit: Payer: Self-pay

## 2019-11-10 VITALS — Temp 97.4°F

## 2019-11-10 DIAGNOSIS — Z452 Encounter for adjustment and management of vascular access device: Secondary | ICD-10-CM | POA: Insufficient documentation

## 2019-11-10 DIAGNOSIS — Z95828 Presence of other vascular implants and grafts: Secondary | ICD-10-CM

## 2019-11-10 DIAGNOSIS — D0512 Intraductal carcinoma in situ of left breast: Secondary | ICD-10-CM | POA: Diagnosis not present

## 2019-11-10 DIAGNOSIS — C23 Malignant neoplasm of gallbladder: Secondary | ICD-10-CM | POA: Diagnosis present

## 2019-11-10 MED ORDER — SODIUM CHLORIDE 0.9% FLUSH
10.0000 mL | INTRAVENOUS | Status: DC | PRN
  Administered 2019-11-10: 10 mL via INTRAVENOUS
  Filled 2019-11-10: qty 10

## 2019-11-10 MED ORDER — HEPARIN SOD (PORK) LOCK FLUSH 100 UNIT/ML IV SOLN
500.0000 [IU] | Freq: Once | INTRAVENOUS | Status: AC
  Administered 2019-11-10: 500 [IU] via INTRAVENOUS
  Filled 2019-11-10: qty 5

## 2019-12-10 DIAGNOSIS — R200 Anesthesia of skin: Secondary | ICD-10-CM | POA: Insufficient documentation

## 2019-12-10 DIAGNOSIS — R208 Other disturbances of skin sensation: Secondary | ICD-10-CM | POA: Insufficient documentation

## 2019-12-16 ENCOUNTER — Other Ambulatory Visit: Payer: Self-pay

## 2019-12-16 DIAGNOSIS — E559 Vitamin D deficiency, unspecified: Secondary | ICD-10-CM

## 2019-12-16 DIAGNOSIS — E519 Thiamine deficiency, unspecified: Secondary | ICD-10-CM

## 2019-12-16 DIAGNOSIS — E538 Deficiency of other specified B group vitamins: Secondary | ICD-10-CM

## 2019-12-16 DIAGNOSIS — R202 Paresthesia of skin: Secondary | ICD-10-CM

## 2019-12-16 DIAGNOSIS — R200 Anesthesia of skin: Secondary | ICD-10-CM

## 2020-01-12 ENCOUNTER — Other Ambulatory Visit: Payer: Self-pay

## 2020-01-12 ENCOUNTER — Inpatient Hospital Stay: Payer: Medicare Other | Attending: Hematology and Oncology

## 2020-01-12 DIAGNOSIS — Z95828 Presence of other vascular implants and grafts: Secondary | ICD-10-CM

## 2020-01-12 DIAGNOSIS — Z79811 Long term (current) use of aromatase inhibitors: Secondary | ICD-10-CM | POA: Diagnosis not present

## 2020-01-12 DIAGNOSIS — Z8509 Personal history of malignant neoplasm of other digestive organs: Secondary | ICD-10-CM | POA: Insufficient documentation

## 2020-01-12 DIAGNOSIS — D0512 Intraductal carcinoma in situ of left breast: Secondary | ICD-10-CM | POA: Diagnosis present

## 2020-01-12 DIAGNOSIS — Z452 Encounter for adjustment and management of vascular access device: Secondary | ICD-10-CM | POA: Diagnosis present

## 2020-01-12 MED ORDER — HEPARIN SOD (PORK) LOCK FLUSH 100 UNIT/ML IV SOLN
INTRAVENOUS | Status: AC
  Filled 2020-01-12: qty 5

## 2020-01-12 MED ORDER — HEPARIN SOD (PORK) LOCK FLUSH 100 UNIT/ML IV SOLN
500.0000 [IU] | Freq: Once | INTRAVENOUS | Status: AC
  Administered 2020-01-12: 500 [IU] via INTRAVENOUS
  Filled 2020-01-12: qty 5

## 2020-02-02 DIAGNOSIS — M79642 Pain in left hand: Secondary | ICD-10-CM | POA: Insufficient documentation

## 2020-02-02 DIAGNOSIS — M79641 Pain in right hand: Secondary | ICD-10-CM | POA: Insufficient documentation

## 2020-02-18 ENCOUNTER — Inpatient Hospital Stay: Payer: Medicare Other | Attending: Hematology and Oncology

## 2020-02-18 ENCOUNTER — Other Ambulatory Visit: Payer: Self-pay

## 2020-02-18 DIAGNOSIS — D0512 Intraductal carcinoma in situ of left breast: Secondary | ICD-10-CM | POA: Insufficient documentation

## 2020-02-18 DIAGNOSIS — Z79811 Long term (current) use of aromatase inhibitors: Secondary | ICD-10-CM | POA: Diagnosis not present

## 2020-02-18 DIAGNOSIS — Z170 Estrogen receptor positive status [ER+]: Secondary | ICD-10-CM | POA: Diagnosis not present

## 2020-02-18 DIAGNOSIS — Z452 Encounter for adjustment and management of vascular access device: Secondary | ICD-10-CM | POA: Diagnosis present

## 2020-02-18 DIAGNOSIS — Z8509 Personal history of malignant neoplasm of other digestive organs: Secondary | ICD-10-CM | POA: Insufficient documentation

## 2020-02-18 DIAGNOSIS — Z95828 Presence of other vascular implants and grafts: Secondary | ICD-10-CM

## 2020-02-18 MED ORDER — HEPARIN SOD (PORK) LOCK FLUSH 100 UNIT/ML IV SOLN
500.0000 [IU] | Freq: Once | INTRAVENOUS | Status: AC
  Administered 2020-02-18: 500 [IU] via INTRAVENOUS
  Filled 2020-02-18: qty 5

## 2020-02-18 MED ORDER — SODIUM CHLORIDE 0.9% FLUSH
10.0000 mL | Freq: Once | INTRAVENOUS | Status: AC
  Administered 2020-02-18: 10 mL via INTRAVENOUS
  Filled 2020-02-18: qty 10

## 2020-03-29 ENCOUNTER — Inpatient Hospital Stay: Payer: Medicare Other | Attending: Hematology and Oncology

## 2020-03-29 ENCOUNTER — Other Ambulatory Visit: Payer: Self-pay

## 2020-03-29 DIAGNOSIS — Z8509 Personal history of malignant neoplasm of other digestive organs: Secondary | ICD-10-CM | POA: Diagnosis present

## 2020-03-29 DIAGNOSIS — Z95828 Presence of other vascular implants and grafts: Secondary | ICD-10-CM

## 2020-03-29 DIAGNOSIS — Z452 Encounter for adjustment and management of vascular access device: Secondary | ICD-10-CM | POA: Insufficient documentation

## 2020-03-29 DIAGNOSIS — D0512 Intraductal carcinoma in situ of left breast: Secondary | ICD-10-CM | POA: Insufficient documentation

## 2020-03-29 DIAGNOSIS — Z79811 Long term (current) use of aromatase inhibitors: Secondary | ICD-10-CM | POA: Diagnosis not present

## 2020-03-29 MED ORDER — SODIUM CHLORIDE 0.9% FLUSH
10.0000 mL | Freq: Once | INTRAVENOUS | Status: AC
  Administered 2020-03-29: 10 mL via INTRAVENOUS
  Filled 2020-03-29: qty 10

## 2020-03-29 MED ORDER — HEPARIN SOD (PORK) LOCK FLUSH 100 UNIT/ML IV SOLN
INTRAVENOUS | Status: AC
  Filled 2020-03-29: qty 5

## 2020-03-29 MED ORDER — HEPARIN SOD (PORK) LOCK FLUSH 100 UNIT/ML IV SOLN
500.0000 [IU] | Freq: Once | INTRAVENOUS | Status: AC
  Administered 2020-03-29: 500 [IU] via INTRAVENOUS
  Filled 2020-03-29: qty 5

## 2020-06-16 ENCOUNTER — Inpatient Hospital Stay: Payer: Medicare Other | Attending: Hematology and Oncology | Admitting: Hematology and Oncology

## 2020-06-16 ENCOUNTER — Inpatient Hospital Stay: Payer: Medicare Other

## 2020-06-16 DIAGNOSIS — E785 Hyperlipidemia, unspecified: Secondary | ICD-10-CM | POA: Insufficient documentation

## 2020-06-16 DIAGNOSIS — T829XXA Unspecified complication of cardiac and vascular prosthetic device, implant and graft, initial encounter: Secondary | ICD-10-CM | POA: Insufficient documentation

## 2020-06-16 DIAGNOSIS — K5190 Ulcerative colitis, unspecified, without complications: Secondary | ICD-10-CM | POA: Insufficient documentation

## 2020-06-16 DIAGNOSIS — I9589 Other hypotension: Secondary | ICD-10-CM | POA: Insufficient documentation

## 2020-07-20 NOTE — Progress Notes (Signed)
[facility]  [address] [place], [place] [address] Phone: [phone]  Fax: [phone]   Clinic Day:  07/21/2020  Referring physician: Garald Balding*  Chief Complaint: Connie Olsen is a 74 y.o. female with a history of stage II gallbladder cancer, left breast DCIS, and end-stage renal disease on dialysis who is seen for 1 year assessment.  HPI:  The patient was last seen in the medical oncology clinic on 06/16/2019 for new patient assessment. At that time, she had "good and bad days".  She noted a 30 pound unintentional weight loss over the past 1.5 years. She was eating less.  She had off and on nausea and "some" emesis.  She had alternating constipation and diarrhea which she attributed to IBS.   Chest abdomen and pelvis CT with contrast and MIPS on 06/30/2019 revealed no evidence of metastatic disease in the chest, abdomen, or pelvis. There was similar appearance of low-attenuation in the left hepatic lobe favoring focal fatty infiltration. The aortocaval lymph node was less conspicuous on this study.   The patient saw Dr. Deitra Mayo on 12/31/2019 for a follow up regarding her left breast DCIS.  Diagnostic mammogram on 12/31/2019 revealed no evidence of malignancy. She continued anastrozole (5 years of adjuvant therapy completes in 08/2020). Follow up is planned for 08/2020 to further review risk assessment given her history of positive margins and lack of radiation therapy.  Bone density on 03/22/2020 revealed osteoporosis with a T score of -2.8 at the right total femur.  The patient saw Dr. Cristino Martes on 06/07/2020 for a follow up regarding her gallbladder cancer. She reported no new GI complaints. Chest, abdomen, and pelvis CT on 06/07/2020 revealed no metastatic disease. Hematocrit was 50.3, hemoglobin 16.4, platelets 154,000, WBC 8,600. CEA was 7.5 (fluctuates). CA19-9 was 167. Follow up was planned in 1 year.  During the  interim, she has been "good."  She reports a poor appetite because she struggles to cook for herself. She states that all of her symptoms are the same. She has hand numbness. She feels "worn out" and states that "her body is reaching it's expiration date."  She would not mind continuing anastrozole if it is clinically indicated.  She undergoes dialysis 3x/week. She has excruciating pain in her hands during the last hour of dialysis. Sometimes that have to stop the dialysis due to the pain.   Past Medical History:  Diagnosis Date  . Barrett's esophagus without dysplasia   . Cholangiocarcinoma ([place])   . Chronic kidney disease   . 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 Endometrial thickening on ultra sound 04/19/2015  . History of ulcerative colitis   . History of uterine fibroid   . Osteoporosis     Past Surgical History:  Procedure Laterality Date  . CHOLECYSTECTOMY    . colonoscopy  05/08/2016  . LAPAROSCOPIC PARTIAL HEPATECTOMY    . MASTECTOMY PARTIAL / LUMPECTOMY    . UPPER GI ENDOSCOPY  05/08/2016    Family History  Problem Relation Age of Onset  . Breast cancer Mother     Social History:  reports that she has never smoked. She has never used smokeless tobacco. She reports that she does not drink alcohol and does not use drugs. She is from [place]. She has had multiple jobs. Her husband worked in [employer] so they moved around a lot.  She lives in [place].  The patient is alone today.  Allergies:  Allergies  Allergen Reactions  . Aspirin Other (See Comments)    Ulcerative colitis: UNABLE TO TOLERATE IF NOT ENTERIC COATED Ulcerative colitis: UNABLE TO TOLERATE IF NOT ENTERIC COATED  . Brassica Napus Seed Oil Other (See Comments)  . Naltrexone Other (See Comments)  . Librax  [Chlordiazepoxide-Clidinium] Anxiety    agitation agitation  . Povidone Iodine Itching and Rash  . Povidone-Iodine Itching and Rash  . Tape Itching    Current  Medications: Current Outpatient Medications  Medication Sig Dispense Refill  . acetaminophen (TYLENOL) 500 MG tablet Take 500 mg by mouth every 6 (six) hours as needed for moderate pain.     Marland Kitchen amitriptyline (ELAVIL) 10 MG tablet Take 10 mg by mouth at bedtime.     Marland Kitchen amoxicillin (AMOXIL) 500 MG tablet TAKE 4 TABLETS BY MOUTH ONCE FOR 1 DOSE. TAKE 30 60 MINUTES PRIOR TO DENTAL PROCEDURE    . anastrozole (ARIMIDEX) 1 MG tablet Take 1 mg by mouth daily.    Marland Kitchen aspirin EC 81 MG tablet Take 81 mg by mouth daily.     . B Complex-C-Folic Acid (DIALYVITE TABLET) TABS Take 1 tablet by mouth daily.     . Baclofen 5 MG TABS Take 2.5 mg by mouth 2 (two) times daily as needed (Spasm, back pain). 10 tablet 0  . Bismuth Subsalicylate 638 VF/64PP SUSP Take 15 mLs by mouth daily. As directed as needed    . Darbepoetin Alfa (ARANESP) 100 MCG/0.5ML SOSY injection Inject 100 mcg into the vein every 7 (seven) days.     . diazepam (VALIUM) 10 MG tablet TAKE 1 TABLET BY MOUTH 1 HOUR PRIOR TO DENTAL TREATMENT    . ferric citrate (AURYXIA) 1 GM 210 MG(Fe) tablet Take 420 mg by mouth 3 (three) times daily.     . folic acid-vitamin b complex-vitamin c-selenium-zinc (DIALYVITE) 3 MG TABS tablet Take 1 tablet by mouth daily.    . Heparin Lock Flush (HEPARIN FLUSH, PORCINE,) 100 UNIT/ML injection 100U/ml Flush port with 64ml every 30 days.    Marland Kitchen lidocaine-prilocaine (EMLA) cream Apply to access before dialysis    . metoprolol tartrate (LOPRESSOR) 25 MG tablet 12.5 mg 2 (two) times daily.   1  . midodrine (PROAMATINE) 10 MG tablet Take by mouth.    Marland Kitchen omeprazole (PRILOSEC) 20 MG capsule TAKE ONE CAPSULE BY MOUTH EVERY DAY 30-45 MINUTES BEFORE BREAKFAST  8  . SENSIPAR 60 MG tablet TAKE 1 TABLET BY MOUTH ONCE A DAY WITH MEALS  11  . sodium chloride 0.9 % injection 51ml of NS flush into port once every 30 days. Follow with heparin flush    . traMADol (ULTRAM) 50 MG tablet Take 1 tablet (50 mg total) by mouth every 12 (twelve) hours  as needed. 10 tablet 0   No current facility-administered medications for this visit.    Review of Systems  Constitutional: Negative for chills, diaphoresis, fever, malaise/fatigue and weight loss (up 2 lbs).       Feels "worn out."  HENT: Negative for congestion, ear discharge, ear pain, hearing loss, nosebleeds, sinus pain, sore throat and tinnitus.   Eyes: Negative for blurred vision and double vision.       Vision is declining.  Respiratory: Positive for cough (constant) and sputum production (phlegm). Negative for hemoptysis and shortness of breath.   Cardiovascular: Positive for chest pain (off and on). Negative for palpitations and leg swelling.  Gastrointestinal: Positive for diarrhea (on and off), nausea (on and off) and vomiting. Negative for abdominal  pain, blood in stool, constipation (on and off), heartburn and melena.       Poor appetite.  Genitourinary: Negative for dysuria, hematuria and urgency.       Began dialysis 11/2012.  Musculoskeletal: Positive for back pain (spasms) and joint pain (bilateral knees, hands; arthritis). Negative for myalgias and neck pain.  Skin: Negative for itching and rash.  Neurological: Positive for sensory change (numbness in fingers and hands) and headaches (off and on). Negative for dizziness, tingling, speech change, focal weakness and weakness.  Endo/Heme/Allergies: Does not bruise/bleed easily.       Type II diabetes.  Psychiatric/Behavioral: Negative.  Negative for depression and memory loss. The patient is not nervous/anxious and does not have insomnia.   All other systems reviewed and are negative.  Performance status (ECOG): 1  Vitals There were no vitals taken for this visit.   Physical Exam Vitals and nursing note reviewed.  Constitutional:      General: She is not in acute distress.    Appearance: She is well-developed. She is not diaphoretic.     Comments: Patient needs assistance on and off exam room table.  She has a  rolling walker by her side.  HENT:     Head: Normocephalic and atraumatic.     Mouth/Throat:     Pharynx: No oropharyngeal exudate.  Eyes:     General: No scleral icterus.    Conjunctiva/sclera: Conjunctivae normal.     Pupils: Pupils are equal, round, and reactive to light.     Comments: Blue eyes.  Neck:     Vascular: No JVD.  Cardiovascular:     Rate and Rhythm: Normal rate and regular rhythm.     Heart sounds: Normal heart sounds. No murmur heard.   Pulmonary:     Effort: Pulmonary effort is normal. No respiratory distress.     Breath sounds: Normal breath sounds. No wheezing or rales.  Chest:     Chest wall: No tenderness.  Abdominal:     General: Bowel sounds are normal. There is no distension.     Palpations: Abdomen is soft. There is no mass.     Tenderness: There is abdominal tenderness. There is no guarding or rebound.  Musculoskeletal:        General: Tenderness (back) present. Normal range of motion.     Cervical back: Normal range of motion and neck supple.  Lymphadenopathy:     Cervical: No cervical adenopathy.     Upper Body:     Right upper body: No supraclavicular adenopathy.     Left upper body: No supraclavicular adenopathy.     Lower Body: No right inguinal adenopathy. No left inguinal adenopathy.  Skin:    General: Skin is warm and dry.     Coloration: Skin is not pale.     Findings: No rash.  Neurological:     Mental Status: She is alert and oriented to person, place, and time.  Psychiatric:        Behavior: Behavior normal.        Thought Content: Thought content normal.        Judgment: Judgment normal.     No visits with results within 3 Day(s) from this visit.  Latest known visit with results is:  Office Visit on 11/14/2017  Component Date Value Ref Range Status  . Atopobium vaginae 11/14/2017 Low - 0  Score Final  . BVAB 2 11/14/2017 Low - 0  Score Final  . Megasphaera 1 11/14/2017 Low -  0  Score Final   Comment: Calculate total score  by adding the 3 individual bacterial vaginosis (BV) marker scores together.  Total score is interpreted as follows: Total score 0-1: Indicates the absence of BV. Total score   2: Indeterminate for BV. Additional clinical                  data should be evaluated to establish a                  diagnosis. Total score 3-6: Indicates the presence of BV. This test was developed and its performance characteristics determined by [facility].  It has not been cleared or approved by the Food and Drug Administration.  The FDA has determined that such clearance or approval is not necessary.   . Candida albicans, NAA 11/14/2017 Negative  Negative Final  . Candida glabrata, NAA 11/14/2017 Negative  Negative Final   Comment: This test was developed and its performance characteristics determined by [facility].  It has not been cleared or approved by the Food and Drug Administration.  The FDA has determined that such clearance or approval is not necessary.   Dalbert Batman vag by NAA 11/14/2017 Negative  Negative Final    Assessment:  Connie Olsen is a 74 y.o. female with a history of stage II gallbladder cancer, left breast DCIS, and ESRD on dialysis.   She has a history of gallbladder cancer s/p laparoscopic cholecystectomy and liver wedge biopsy by Dr Mariah Milling at [facility] on 04/15/2014.  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  Tumor invaded perimuscular connective tissue with no extension beyond the serosa or into the liver.  Margins were uninvolved (closest 0.5 mm).  There is no lymphovascular or perineural invasion.  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  One lymph node was negative.  Pathologic stage was pT2 pN0.   Abdomen and pelvis CT on 12/30/2018 showed no definite metastatic disease within the chest, abdomen, or pelvis. There was minimally increased conspicuity of a hypodense left  hepatic lesion, favor benign in etiology such as focal fat given relative stability since 2017. Attention on follow up imaging was recommended.  There was no significant change in the mildy prominent aortocaval lymph node.   Chest abdomen and pelvis CT with contrast and MIPS on 06/30/2019 revealed no evidence of metastatic disease in the chest, abdomen, or pelvis. There was similar appearance of low-attenuation in the left hepatic lobe favoring focal fatty infiltration. The aortocaval lymph node was less conspicuous on this study.  Chest, abdomen, and pelvis CT on 06/07/2020 revealed no metastatic disease.   CA19-9 has been followed: 87 on 10/15/2014, 165 on 01/21/2015, 92 on 03/22/2015, 131 on 09/20/2015, 168 on 03/20/2016, 89 on 09/18/2016, 143 on 09/24/2017, 495 on 09/16/2018, 140 on 12/30/2018, and 167 on 06/07/2020.  She has a history of DCIS left breast (11/2014) s/p multiple excisions with + margins.  DCIS was ER and PR positive.  She declined further surgery or radiation.  She remains on Armidex.  She is followed by Dr. Deitra Mayo at [facility].  Diagnostic bilateral mammogram on 12/31/2019 revealed no evidence of malignancy.   She is on dialysis 3 times a week.   She has had recurrent episodes of AV-fistula/graft thrombosis.  She has been seen in the hematology clinic by Dr. Dyann Kief.   Thrombophilia work-up was not recommended.  Anticardiolipin antibody IgM was > 150 on 11/28/2016  and 06/06/2017.  Beta-2 glycoprotein was > 150 on 11/28/2016 and 06/06/2017.  Lupus anticoagulant panel was + on 12/24/2016 and 06/06/2017.   Bone density on 11/11/2017 revealed osteopenia with a T score of -1.40 at the right neck femur and osteoporosis with a T-score of -3.3 in the left radius. Bone density on 03/22/2020 revealed osteoporosis with a T score of -2.8 at the right total femur. She is not a candidate for Zometa given her ESRD.  She has IBS.  Last colonoscopy was on 05/08/2016.  She has Barrett's  esophagus. Last EGD was on 05/23/2017.  Echo on 03/27/2019 revealed ejection fraction > 55%.   The patient has received both doses of the COVID-19 vaccine.  Symptomatically, she has been "good."  She reports a poor appetite because she struggles to cook for herself.  She feels "worn out."  Plan: 1.   Review labs during the interim. 2.   Stage II gallbladder cancer   Symptomatically, she is doing well.  Review chest, abdomen, and pelvis CT on 06/07/2020.   No evidence of recurrent disease.  CA19-9 is 167 (fluctuates).  Review note from Dr Mariah Milling at [facility] on 06/07/2020. 3.   Left breast DCIS  She has a h/o + margins.  She declined radiation.  Bilateral diagnostic mammogram on 12/31/2019 revealed no recurrence.   She has been on Arimidex x 5 years (completed 08/2020).  Consider extended adjuvant Arimidex secondary to positive margins and no radiation.  Follow-up as scheduled with Dr Deitra Mayo at [facility]. 4.   Recurrent AV-fistula/graft thrombosis  Patient with + lupus anticoagulant, anti-cardiolipin antibodies, beta-2 glycoprotein antibodies.  Patient on aspirin.  Discuss follow-up with Dr. Dyann Kief. 5.   Erythrocytosis  Hematocrit 52.5. Hemoglobin 17.1 and MCV 95 on 06/30/2019.  Hematocrit 48.8. Hemoglobin 15.6 and MCV 94 on 12/01/2019.  Hematocrit 50.3. Hemoglobin 16.4 and MCV 93 on 06/07/2020.  Patient on dialysis. Unclear if she is receiving ESA's.  Consider erythrocytosis evaluation as hemoglobin > 16. 6.   Port-a cath maintenance  Patient has only port maintenance in this clinic. 7.   RTC every 6 weeks x 1 year for port flush 8.   RTC in 1 year for MD assessment and port flush.  I discussed the assessment and treatment plan with the patient.  The patient was provided an opportunity to ask questions and all were answered.  The patient agreed with the plan and demonstrated an understanding of the instructions.  The patient was advised to call back if the symptoms worsen or if the  condition fails to improve as anticipated.  I provided 18 minutes of face-to-face time during this this encounter and > 50% was spent counseling as documented under my assessment and plan.  An additional 10 minutes were spent reviewing her chart (Epic and Care Everywhere) including notes, labs, and imaging studies.    Haevyn Ury C. Mike Gip, MD, PhD    07/21/2020, 3:34 PM  I, Mirian Mo Tufford, am acting as Education administrator for [employer]. Mike Gip, MD, PhD.  I, Zymarion Favorite C. Mike Gip, MD, have reviewed the above documentation for accuracy and completeness, and I agree with the above.

## 2020-07-21 ENCOUNTER — Encounter: Payer: Self-pay | Admitting: Hematology and Oncology

## 2020-07-21 ENCOUNTER — Inpatient Hospital Stay (HOSPITAL_BASED_OUTPATIENT_CLINIC_OR_DEPARTMENT_OTHER): Payer: Medicare Other | Admitting: Hematology and Oncology

## 2020-07-21 ENCOUNTER — Other Ambulatory Visit: Payer: Self-pay

## 2020-07-21 ENCOUNTER — Inpatient Hospital Stay: Payer: Medicare Other | Attending: Hematology and Oncology

## 2020-07-21 VITALS — BP 60/28 | HR 83 | Temp 98.5°F | Resp 20 | Wt 172.3 lb

## 2020-07-21 DIAGNOSIS — Z452 Encounter for adjustment and management of vascular access device: Secondary | ICD-10-CM | POA: Diagnosis present

## 2020-07-21 DIAGNOSIS — D0512 Intraductal carcinoma in situ of left breast: Secondary | ICD-10-CM

## 2020-07-21 DIAGNOSIS — Z95828 Presence of other vascular implants and grafts: Secondary | ICD-10-CM | POA: Diagnosis not present

## 2020-07-21 DIAGNOSIS — Z8509 Personal history of malignant neoplasm of other digestive organs: Secondary | ICD-10-CM | POA: Diagnosis present

## 2020-07-21 DIAGNOSIS — C23 Malignant neoplasm of gallbladder: Secondary | ICD-10-CM

## 2020-07-21 DIAGNOSIS — D6859 Other primary thrombophilia: Secondary | ICD-10-CM | POA: Diagnosis not present

## 2020-07-21 MED ORDER — SODIUM CHLORIDE 0.9% FLUSH
10.0000 mL | INTRAVENOUS | Status: DC | PRN
  Administered 2020-07-21: 10 mL via INTRAVENOUS
  Filled 2020-07-21: qty 10

## 2020-07-21 MED ORDER — HEPARIN SOD (PORK) LOCK FLUSH 100 UNIT/ML IV SOLN
500.0000 [IU] | Freq: Once | INTRAVENOUS | Status: AC
  Administered 2020-07-21: 500 [IU] via INTRAVENOUS
  Filled 2020-07-21: qty 5

## 2020-07-21 NOTE — Progress Notes (Signed)
Patient here today for follow up. States she was having issues with pain all over and keeps her up at night. Patient refused bp in arms and states she normally has bp taking using ankle. Reports she doesn't have much of an appetite. She denies questions or concerns.

## 2020-08-03 IMAGING — CR DG CHEST 2V
2 series · 2 of 2 positions shown · non-contrast
Comparison: None.

CLINICAL DATA: Cough and chills with fever

EXAM:
CHEST - 2 VIEW

[chest pa]
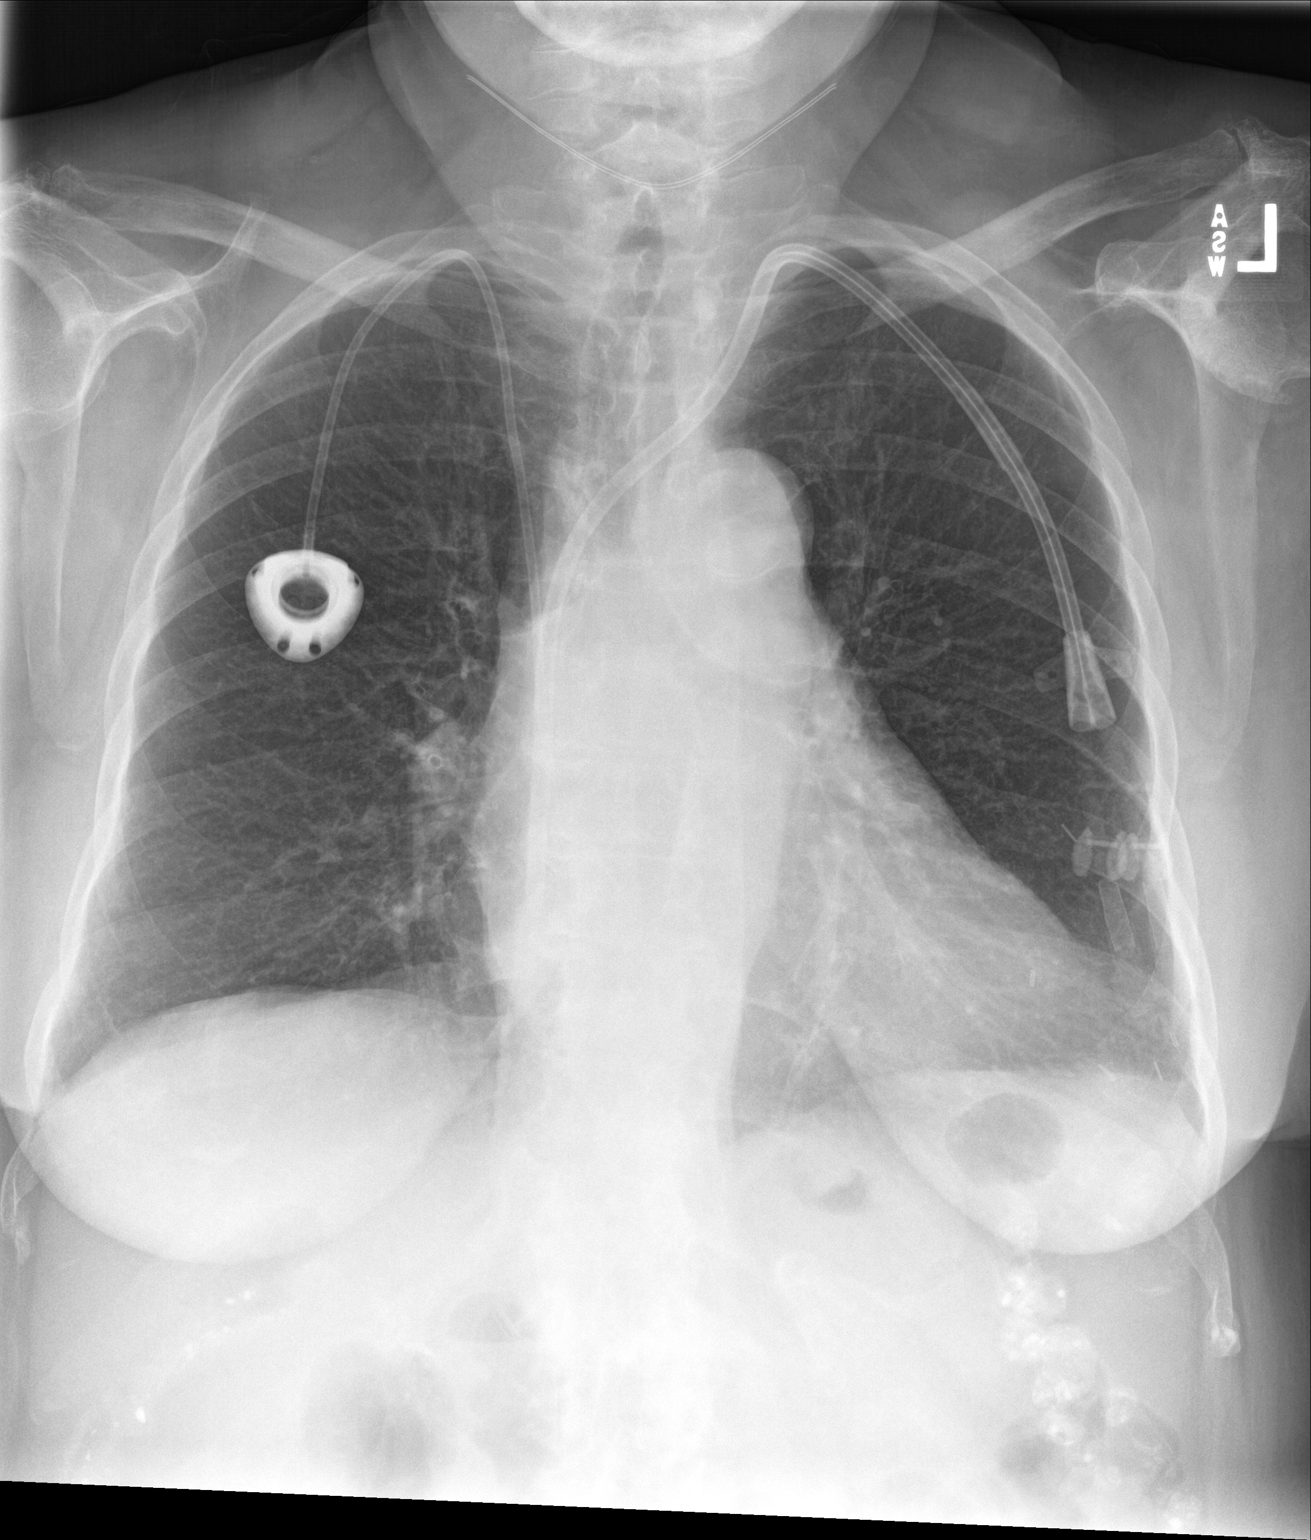

[chest lat]
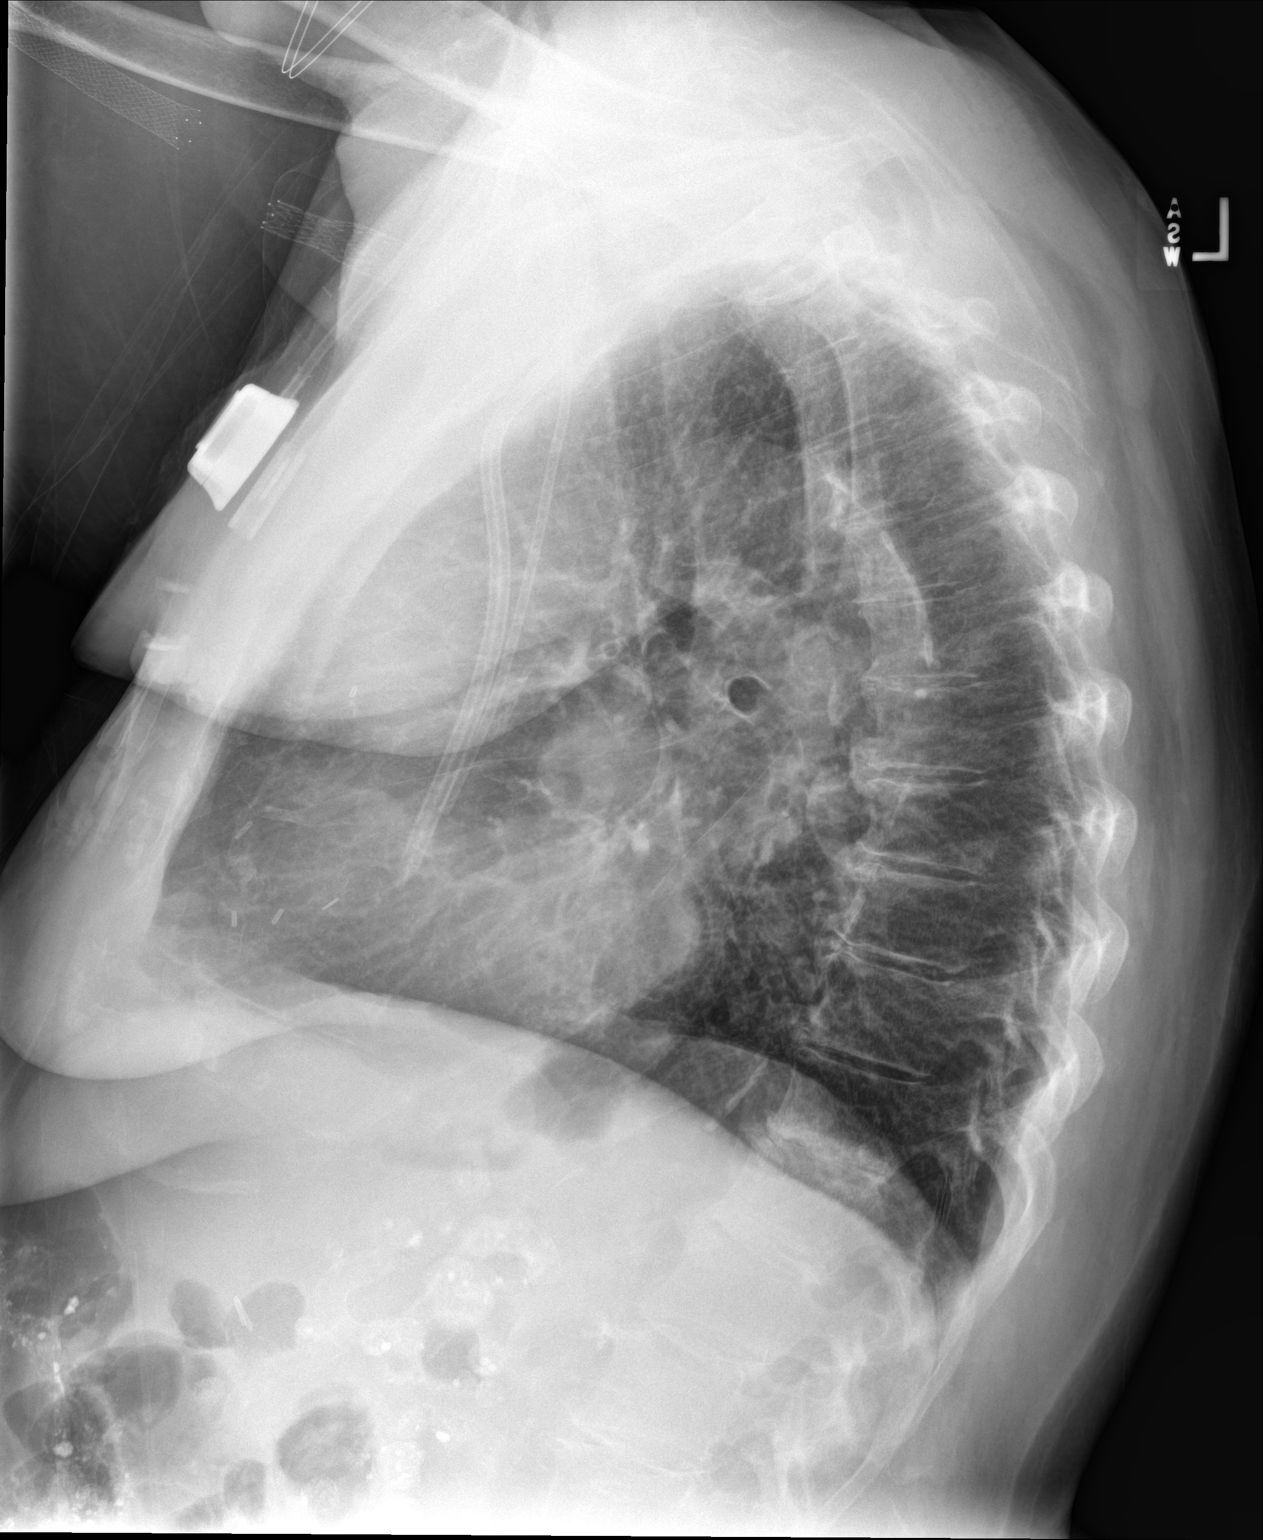

[2 of 2 positions shown; findings below may reference images not displayed]

FINDINGS: Port-A-Cath tip is in the right atrium slightly beyond the
cavoatrial junction. Dual-lumen catheter tip is also in the right
atrium. No pneumothorax. There is mild left base atelectasis. There
is no edema or consolidation. Heart is upper normal in size with
pulmonary vascularity normal. No adenopathy. There is aortic
atherosclerosis. No evident bone lesions.
IMPRESSION: Central catheters as described without pneumothorax. Mild left base
atelectasis. No edema or consolidation. Heart upper normal in size
with pulmonary vascularity normal. There is aortic atherosclerosis.

Aortic Atherosclerosis ([id number].C).

## 2020-08-23 ENCOUNTER — Telehealth: Payer: Self-pay | Admitting: Hematology and Oncology

## 2020-08-23 NOTE — Telephone Encounter (Signed)
I have called this patient several times to schedule her an appt. Her mailbox is full and I cannot leave a message.

## 2020-09-01 ENCOUNTER — Inpatient Hospital Stay: Payer: Medicare Other | Attending: Hematology and Oncology

## 2020-09-01 ENCOUNTER — Other Ambulatory Visit: Payer: Self-pay

## 2020-09-01 DIAGNOSIS — Z452 Encounter for adjustment and management of vascular access device: Secondary | ICD-10-CM | POA: Insufficient documentation

## 2020-09-01 DIAGNOSIS — Z79811 Long term (current) use of aromatase inhibitors: Secondary | ICD-10-CM | POA: Diagnosis not present

## 2020-09-01 DIAGNOSIS — D0512 Intraductal carcinoma in situ of left breast: Secondary | ICD-10-CM | POA: Insufficient documentation

## 2020-09-01 DIAGNOSIS — Z95828 Presence of other vascular implants and grafts: Secondary | ICD-10-CM

## 2020-09-01 DIAGNOSIS — Z8509 Personal history of malignant neoplasm of other digestive organs: Secondary | ICD-10-CM | POA: Diagnosis present

## 2020-09-01 DIAGNOSIS — Z7982 Long term (current) use of aspirin: Secondary | ICD-10-CM | POA: Diagnosis not present

## 2020-09-01 DIAGNOSIS — Z79899 Other long term (current) drug therapy: Secondary | ICD-10-CM | POA: Diagnosis not present

## 2020-09-01 MED ORDER — HEPARIN SOD (PORK) LOCK FLUSH 100 UNIT/ML IV SOLN
500.0000 [IU] | Freq: Once | INTRAVENOUS | Status: AC
  Administered 2020-09-01: 500 [IU] via INTRAVENOUS
  Filled 2020-09-01: qty 5

## 2020-09-01 MED ORDER — SODIUM CHLORIDE 0.9% FLUSH
10.0000 mL | INTRAVENOUS | Status: DC | PRN
  Administered 2020-09-01: 10 mL via INTRAVENOUS
  Filled 2020-09-01: qty 10

## 2020-09-05 ENCOUNTER — Other Ambulatory Visit: Payer: Self-pay

## 2020-09-05 DIAGNOSIS — D0512 Intraductal carcinoma in situ of left breast: Secondary | ICD-10-CM

## 2020-09-05 NOTE — Progress Notes (Signed)
[facility]  [address] [place], [place] [address] Phone: [phone]  Fax: [phone]   Clinic Day:  09/06/2020  Referring physician: Garald Balding*  Chief Complaint: Connie Olsen is a 75 y.o. female with a history of stage II gallbladder cancer, left breast DCIS, and end-stage renal disease on dialysis who is seen for 6 week assessment and evaluation for erythrocytosis.  HPI:  The patient was last seen in the medical oncology clinic on 07/21/2020. At that time, she felt "good."  She reported a poor appetite because she struggled to cook for herself.  She felt "worn out."  Chest, abdomen and pelvis CT on 06/07/2020 revealed no evidence of recurrent disease.  CA 19-9 was 167.  We discussed consideration of extended adjuvant Arimidex secondary to positive margins and no radiation for her left breast DCIS.  She was scheduled to follow-up with Dr. Deitra Mayo at [facility].    CBC on 06/07/2020 revealed a hematocrit of 50.3, hemoglobin 16.4 and MCV 93.  It was unclear if she was receiving ESA's on dialysis.  We discussed consideration of a work-up erythrocytosis as her hemoglobin was > 16.  The patient saw Dr Harden Mo via telemedicine on 08/09/2020 via telemedicine for a follow-up regarding DCIS. She was to finish anastrozole at the end of 08/2020. She denied any breast symptoms.  Follow-up mammogram is scheduled in 12/2020.  She was felt have high risk disease given her history of positive margins and lack of radiation.  During the interim, she has been "so-so." Her whole body hurts. She has osteoarthritis in her knees and back and a burning painful wound on her leg that will not heal. She is going to the wound center at the end of this month. She had to have an AV fistula revision on her right arm on 06/24/2020 and is slowly regaining strength back in her arm and hand. She denies any new symptoms or complaints.  The patient states that her  blood pressure always runs low. She preferred her blood pressure to be taken on her forearm due to her fistulas. She took her blood pressure medication this morning. She states that a "good" blood pressure reading for her is 89/54. She takes midodrine halfway through dialysis. She last had dialysis yesterday. She is not more dizzy or lightheaded than normal.  She comments that the dialysis center has been  "dumping" her blood for the past few weeks due to her high hemoglobin. She is unsure if they are giving her erythropoietin. She takes Auryxia (Ferric Citrate Tablets).  She had kidney stones. She does not make urine.  She is finishing her last bottle of Arimidex next month.   Past Medical History:  Diagnosis Date  . Barrett's esophagus without dysplasia   . Cholangiocarcinoma ([place])   . Chronic kidney disease   . 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 Endometrial thickening on ultra sound 04/19/2015  . History of ulcerative colitis   . History of uterine fibroid   . Osteoporosis     Past Surgical History:  Procedure Laterality Date  . CHOLECYSTECTOMY    . colonoscopy  05/08/2016  . LAPAROSCOPIC PARTIAL HEPATECTOMY    . MASTECTOMY PARTIAL / LUMPECTOMY    . UPPER GI ENDOSCOPY  05/08/2016    Family History  Problem Relation Age of Onset  . Breast cancer Mother     Social History:  reports that she has never smoked. She has never used smokeless tobacco.  She reports that she does not drink alcohol and does not use drugs. She is from [place]. She has had multiple jobs. Her husband worked in [employer] so they moved around a lot.  She lives in [place].  The patient is alone today.  Allergies:  Allergies  Allergen Reactions  . Aspirin Other (See Comments)    Ulcerative colitis: UNABLE TO TOLERATE IF NOT ENTERIC COATED Ulcerative colitis: UNABLE TO TOLERATE IF NOT ENTERIC COATED  . Brassica Napus Seed Oil Other (See Comments)  . Naltrexone Other (See Comments)  .  Librax  [Chlordiazepoxide-Clidinium] Anxiety    agitation agitation  . Povidone Iodine Itching and Rash  . Povidone-Iodine Itching and Rash  . Tape Itching    Current Medications: Current Outpatient Medications  Medication Sig Dispense Refill  . acetaminophen (TYLENOL) 500 MG tablet Take 500 mg by mouth every 6 (six) hours as needed for moderate pain.     Marland Kitchen amitriptyline (ELAVIL) 10 MG tablet Take 10 mg by mouth at bedtime.    Marland Kitchen anastrozole (ARIMIDEX) 1 MG tablet Take 1 mg by mouth daily.    Marland Kitchen aspirin EC 81 MG tablet Take 81 mg by mouth daily.     . B Complex-C-Folic Acid (DIALYVITE TABLET) TABS Take 1 tablet by mouth daily.     . Bismuth Subsalicylate 409 WJ/19JY SUSP Take 15 mLs by mouth daily. As directed as needed    . Darbepoetin Alfa (ARANESP) 100 MCG/0.5ML SOSY injection Inject 100 mcg into the vein every 7 (seven) days.     . ferric citrate (AURYXIA) 1 GM 210 MG(Fe) tablet Take 420 mg by mouth 3 (three) times daily.    . Heparin Lock Flush (HEPARIN FLUSH, PORCINE,) 100 UNIT/ML injection 100U/ml Flush port with 57ml every 30 days.    Marland Kitchen ibuprofen (ADVIL) 200 MG tablet Take 200 mg by mouth as needed.    . lidocaine-prilocaine (EMLA) cream Apply to access before dialysis    . metoprolol tartrate (LOPRESSOR) 25 MG tablet 25 mg 2 (two) times daily.   1  . midodrine (PROAMATINE) 10 MG tablet Take by mouth.    . pregabalin (LYRICA) 25 MG capsule Take 25 mg by mouth 3 (three) times daily.    . SENSIPAR 60 MG tablet TAKE 1 TABLET BY MOUTH ONCE A DAY WITH MEALS  11  . sodium chloride 0.9 % injection 64ml of NS flush into port once every 30 days. Follow with heparin flush    . amoxicillin (AMOXIL) 500 MG tablet TAKE 4 TABLETS BY MOUTH ONCE FOR 1 DOSE. TAKE 30 60 MINUTES PRIOR TO DENTAL PROCEDURE (Patient not taking: No sig reported)    . Baclofen 5 MG TABS Take 2.5 mg by mouth 2 (two) times daily as needed (Spasm, back pain). (Patient not taking: Reported on 09/06/2020) 10 tablet 0  . diazepam  (VALIUM) 10 MG tablet TAKE 1 TABLET BY MOUTH 1 HOUR PRIOR TO DENTAL TREATMENT (Patient not taking: No sig reported)    . omeprazole (PRILOSEC) 20 MG capsule TAKE ONE CAPSULE BY MOUTH EVERY DAY 30-45 MINUTES BEFORE BREAKFAST (Patient not taking: Reported on 09/06/2020)  8  . oxyCODONE (OXY IR/ROXICODONE) 5 MG immediate release tablet Take 5 mg by mouth. 4 - 5 times daily as needed (Patient not taking: Reported on 09/06/2020)    . traMADol (ULTRAM) 50 MG tablet Take 1 tablet (50 mg total) by mouth every 12 (twelve) hours as needed. (Patient not taking: No sig reported) 10 tablet 0   No current  facility-administered medications for this visit.    Review of Systems  Constitutional: Positive for weight loss (1 lb). Negative for chills, diaphoresis, fever and malaise/fatigue.       Feels "so-so."  HENT: Negative for congestion, ear discharge, ear pain, hearing loss, nosebleeds, sinus pain, sore throat and tinnitus.   Eyes: Negative for blurred vision and double vision.       Vision is declining.  Respiratory: Positive for cough (constant) and sputum production (phlegm). Negative for hemoptysis and shortness of breath.   Cardiovascular: Positive for chest pain (off and on). Negative for palpitations and leg swelling.       BP runs low  Gastrointestinal: Positive for diarrhea (on and off), nausea (on and off) and vomiting. Negative for abdominal pain, blood in stool, constipation, heartburn and melena.       Poor appetite.  Genitourinary: Negative for dysuria, hematuria and urgency.       Began dialysis 11/2012.  Musculoskeletal: Positive for back pain and joint pain (bilateral knees, hands; arthritis). Negative for myalgias and neck pain.  Skin: Negative for itching and rash.       Wound on leg that won't heal  Neurological: Positive for sensory change (numbness in fingers and hands) and headaches (off and on). Negative for dizziness, tingling, speech change, focal weakness and weakness.   Endo/Heme/Allergies: Does not bruise/bleed easily.       Type II diabetes.  Psychiatric/Behavioral: Negative.  Negative for depression and memory loss. The patient is not nervous/anxious and does not have insomnia.   All other systems reviewed and are negative.  Performance status (ECOG): 1  Vitals Blood pressure (!) 72/37, pulse 87, temperature 99 F (37.2 C), temperature source Tympanic, resp. rate 18, weight 171 lb 8.3 oz (77.8 kg), SpO2 99 %.   Physical Exam Vitals and nursing note reviewed.  Constitutional:      General: She is not in acute distress.    Appearance: She is well-developed. She is not diaphoretic.  Eyes:     General: No scleral icterus.    Conjunctiva/sclera: Conjunctivae normal.     Comments: Blue eyes.  Neurological:     Mental Status: She is alert and oriented to person, place, and time.  Psychiatric:        Behavior: Behavior normal.        Thought Content: Thought content normal.        Judgment: Judgment normal.     No visits with results within 3 Day(s) from this visit.  Latest known visit with results is:  Office Visit on 11/14/2017  Component Date Value Ref Range Status  . Atopobium vaginae 11/14/2017 Low - 0  Score Final  . BVAB 2 11/14/2017 Low - 0  Score Final  . Megasphaera 1 11/14/2017 Low - 0  Score Final   Comment: Calculate total score by adding the 3 individual bacterial vaginosis (BV) marker scores together.  Total score is interpreted as follows: Total score 0-1: Indicates the absence of BV. Total score   2: Indeterminate for BV. Additional clinical                  data should be evaluated to establish a                  diagnosis. Total score 3-6: Indicates the presence of BV. This test was developed and its performance characteristics determined by [facility].  It has not been cleared or approved by the Food and Drug Administration.  The  FDA has determined that such clearance or approval is not necessary.   . Candida albicans,  NAA 11/14/2017 Negative  Negative Final  . Candida glabrata, NAA 11/14/2017 Negative  Negative Final   Comment: This test was developed and its performance characteristics determined by [facility].  It has not been cleared or approved by the Food and Drug Administration.  The FDA has determined that such clearance or approval is not necessary.   Dalbert Batman vag by NAA 11/14/2017 Negative  Negative Final    Assessment:  Connie Olsen is a 75 y.o. female with a history of stage II gallbladder cancer, left breast DCIS, and ESRD on dialysis.   She has a history of gallbladder cancer s/p laparoscopic cholecystectomy and liver wedge biopsy by Dr Mariah Milling at [facility] on 04/15/2014.  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  Tumor invaded perimuscular connective tissue with no extension beyond the serosa or into the liver.  Margins were uninvolved (closest 0.5 mm).  There is no lymphovascular or perineural invasion.  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  One lymph node was negative.  Pathologic stage was pT2 pN0.   Abdomen and pelvis CT on 12/30/2018 showed no definite metastatic disease within the chest, abdomen, or pelvis. There was minimally increased conspicuity of a hypodense left hepatic lesion, favor benign in etiology such as focal fat given relative stability since 2017. Attention on follow up imaging was recommended.  There was no significant change in the mildy prominent aortocaval lymph node.   Chest abdomen and pelvis CT with contrast and MIPS on 06/30/2019 revealed no evidence of metastatic disease in the chest, abdomen, or pelvis. There was similar appearance of low-attenuation in the left hepatic lobe favoring focal fatty infiltration. The aortocaval lymph node was less conspicuous on this study.  Chest, abdomen, and pelvis CT on 06/07/2020 revealed no metastatic disease.    CA19-9 has been followed: 87 on 10/15/2014, 165 on 01/21/2015, 92 on 03/22/2015, 131 on 09/20/2015, 168 on 03/20/2016, 89 on 09/18/2016, 143 on 09/24/2017, 495 on 09/16/2018, 140 on 12/30/2018, and 167 on 06/07/2020.  She has a history of DCIS left breast (11/2014) s/p multiple excisions with + margins.  DCIS was ER and PR positive.  She declined further surgery or radiation.  She remains on Armidex.  She is followed by Dr. Deitra Mayo at [facility].  Diagnostic bilateral mammogram on 12/31/2019 revealed no evidence of malignancy.  She is finishing her last bottle of Arimidex next month.  She is on dialysis 3 times a week.   She has had recurrent episodes of AV-fistula/graft thrombosis.  She has been seen in the hematology clinic by Dr. Dyann Kief.   Thrombophilia work-up was not recommended.  Anticardiolipin antibody IgM was > 150 on 11/28/2016 and 06/06/2017.  Beta-2 glycoprotein was > 150 on 11/28/2016 and 06/06/2017.  Lupus anticoagulant panel was + on 12/24/2016 and 06/06/2017.   Bone density on 11/11/2017 revealed osteopenia with a T score of -1.40 at the right neck femur and osteoporosis with a T-score of -3.3 in the left radius. Bone density on 03/22/2020 revealed osteoporosis with a T score of -2.8 at the right total femur. She is not a candidate for Zometa given her ESRD.  She has IBS.  Last colonoscopy was on 05/08/2016.  She has Barrett's esophagus. Last EGD was on 05/23/2017.  Echo on 03/27/2019 revealed ejection fraction > 55%.   She has erythrocytosis.  The dialysis center has been  "dumping" her blood for the past few weeks due to her high hemoglobin. She is unsure if they are giving her erythropoietin. She takes Auryxia (Ferric Citrate Tablets).  The patient has received both doses of the COVID-19 vaccine.  Symptomatically, she feels "so-so."  Her whole body hurts. She has osteoarthritis in her knees and back and a burning painful wound on her leg that will not heal. Her blood  pressure always runs low; a "good" blood pressure reading for her is 89/54. She takes midodrine halfway through dialysis. She had dialysis yesterday.   Plan: 1.   Labs:  CBC with diff, ferritin, iron studies, erythropoietin level, carbon monoxide level, JAK2 with reflex. 2.   Erythrocytosis  Hematocrit 52.5. Hemoglobin 17.1 and MCV 95.0 on 06/30/2019.  Hematocrit 48.8. Hemoglobin 15.6 and MCV 94.0 on 12/01/2019.  Hematocrit 50.3. Hemoglobin 16.4 and MCV 93.0 on 06/07/2020.  Hematocrit 46.2. Hemoglobin 14.8 and MCV 90.9 on 09/06/2020.  Patient on dialysis. She is uncertain if she is receiving ESA's.  She notes that her blood has been "dumped" in dialysis for the last few weeks secondary to high hemoglobin.  Discuss plan for work-up. 3.   Stage II gallbladder cancer   Symptomatically, she is doing well.  Chest, abdomen, and pelvis CT on 06/07/2020 revealed no evidence of recurrent disease.  CA19-9 was 167 on 06/07/2020 (fluctuates).  Continue to monitor. 4.   Left breast DCIS  She has a h/o + margins.  She declined radiation.  Bilateral diagnostic mammogram on 12/31/2019 revealed no recurrence.   She has been on Arimidex x 5 years.   She plans to finish her last bottle of Arimidex next month.  Follow-up as scheduled with Dr Deitra Mayo at [facility]. 5.   Recurrent AV-fistula/graft thrombosis  Patient with + lupus anticoagulant, anti-cardiolipin antibodies, beta-2 glycoprotein antibodies.  She had a AV fistula revision on 06/24/2020.  She is on aspirin.  Follow-up with Dr. Dyann Kief. 6.   Port-a cath maintenance  Patient has only port maintenance in this clinic.  Port lab draw today. 7.   RTC in 2 weeks for MD assessment and review of work-up.  I discussed the assessment and treatment plan with the patient.  The patient was provided an opportunity to ask questions and all were answered.  The patient agreed with the plan and demonstrated an understanding of the instructions.  The patient  was advised to call back if the symptoms worsen or if the condition fails to improve as anticipated.  I provided 24 minutes of face-to-face time during this this encounter and > 50% was spent counseling as documented under my assessment and plan. An additional 10 minutes were spent reviewing her chart (Epic and Care Everywhere) including notes, labs, and imaging studies.    Gavin Faivre C. Mike Gip, MD, PhD    09/06/2020, 3:42 PM  I, Mirian Mo Tufford, am acting as Education administrator for [employer]. Mike Gip, MD, PhD.  I, Evanthia Maund C. Mike Gip, MD, have reviewed the above documentation for accuracy and completeness, and I agree with the above.

## 2020-09-06 ENCOUNTER — Inpatient Hospital Stay: Payer: Medicare Other | Attending: Hematology and Oncology | Admitting: Hematology and Oncology

## 2020-09-06 ENCOUNTER — Inpatient Hospital Stay: Payer: Medicare Other | Attending: Hematology and Oncology

## 2020-09-06 ENCOUNTER — Encounter: Payer: Self-pay | Admitting: Hematology and Oncology

## 2020-09-06 ENCOUNTER — Inpatient Hospital Stay: Payer: Medicare Other

## 2020-09-06 ENCOUNTER — Other Ambulatory Visit: Payer: Self-pay

## 2020-09-06 ENCOUNTER — Other Ambulatory Visit: Payer: Self-pay | Admitting: Hematology and Oncology

## 2020-09-06 VITALS — BP 72/37 | HR 87 | Temp 99.0°F | Resp 18 | Wt 171.5 lb

## 2020-09-06 DIAGNOSIS — Z95828 Presence of other vascular implants and grafts: Secondary | ICD-10-CM

## 2020-09-06 DIAGNOSIS — Z8509 Personal history of malignant neoplasm of other digestive organs: Secondary | ICD-10-CM | POA: Insufficient documentation

## 2020-09-06 DIAGNOSIS — Z7982 Long term (current) use of aspirin: Secondary | ICD-10-CM | POA: Diagnosis not present

## 2020-09-06 DIAGNOSIS — Z992 Dependence on renal dialysis: Secondary | ICD-10-CM | POA: Diagnosis not present

## 2020-09-06 DIAGNOSIS — Z79899 Other long term (current) drug therapy: Secondary | ICD-10-CM | POA: Diagnosis not present

## 2020-09-06 DIAGNOSIS — D751 Secondary polycythemia: Secondary | ICD-10-CM

## 2020-09-06 DIAGNOSIS — R200 Anesthesia of skin: Secondary | ICD-10-CM

## 2020-09-06 DIAGNOSIS — R202 Paresthesia of skin: Secondary | ICD-10-CM

## 2020-09-06 DIAGNOSIS — Z79811 Long term (current) use of aromatase inhibitors: Secondary | ICD-10-CM | POA: Insufficient documentation

## 2020-09-06 DIAGNOSIS — Z452 Encounter for adjustment and management of vascular access device: Secondary | ICD-10-CM | POA: Diagnosis present

## 2020-09-06 DIAGNOSIS — D0512 Intraductal carcinoma in situ of left breast: Secondary | ICD-10-CM | POA: Diagnosis not present

## 2020-09-06 DIAGNOSIS — Z803 Family history of malignant neoplasm of breast: Secondary | ICD-10-CM | POA: Insufficient documentation

## 2020-09-06 DIAGNOSIS — C23 Malignant neoplasm of gallbladder: Secondary | ICD-10-CM | POA: Diagnosis not present

## 2020-09-06 DIAGNOSIS — N186 End stage renal disease: Secondary | ICD-10-CM | POA: Insufficient documentation

## 2020-09-06 LAB — CBC WITH DIFFERENTIAL/PLATELET
Abs Immature Granulocytes: 0.03 10*3/uL (ref 0.00–0.07)
Basophils Absolute: 0.1 10*3/uL (ref 0.0–0.1)
Basophils Relative: 1 %
Eosinophils Absolute: 0.3 10*3/uL (ref 0.0–0.5)
Eosinophils Relative: 4 %
HCT: 46.2 % — ABNORMAL HIGH (ref 36.0–46.0)
Hemoglobin: 14.8 g/dL (ref 12.0–15.0)
Immature Granulocytes: 0 %
Lymphocytes Relative: 6 %
Lymphs Abs: 0.5 10*3/uL — ABNORMAL LOW (ref 0.7–4.0)
MCH: 29.1 pg (ref 26.0–34.0)
MCHC: 32 g/dL (ref 30.0–36.0)
MCV: 90.9 fL (ref 80.0–100.0)
Monocytes Absolute: 0.6 10*3/uL (ref 0.1–1.0)
Monocytes Relative: 7 %
Neutro Abs: 6.2 10*3/uL (ref 1.7–7.7)
Neutrophils Relative %: 82 %
Platelets: 242 10*3/uL (ref 150–400)
RBC: 5.08 MIL/uL (ref 3.87–5.11)
RDW: 15.9 % — ABNORMAL HIGH (ref 11.5–15.5)
WBC: 7.7 10*3/uL (ref 4.0–10.5)
nRBC: 0 % (ref 0.0–0.2)

## 2020-09-06 LAB — FERRITIN: Ferritin: 419 ng/mL — ABNORMAL HIGH (ref 11–307)

## 2020-09-06 LAB — IRON AND TIBC
Iron: 57 ug/dL (ref 28–170)
Saturation Ratios: 29 % (ref 10.4–31.8)
TIBC: 196 ug/dL — ABNORMAL LOW (ref 250–450)
UIBC: 139 ug/dL

## 2020-09-06 MED ORDER — HEPARIN SOD (PORK) LOCK FLUSH 100 UNIT/ML IV SOLN
500.0000 [IU] | Freq: Once | INTRAVENOUS | Status: AC
  Administered 2020-09-06: 500 [IU] via INTRAVENOUS
  Filled 2020-09-06: qty 5

## 2020-09-06 MED ORDER — SODIUM CHLORIDE 0.9% FLUSH
10.0000 mL | INTRAVENOUS | Status: DC | PRN
  Administered 2020-09-06: 10 mL via INTRAVENOUS
  Filled 2020-09-06: qty 10

## 2020-09-06 NOTE — Progress Notes (Signed)
Patient here for oncology follow-up appointment, expresses concerns of chronic pain and leg sores

## 2020-09-07 LAB — CARBON MONOXIDE, BLOOD (PERFORMED AT REF LAB): Carbon Monoxide, Blood: 4.3 % — ABNORMAL HIGH (ref 0.0–3.6)

## 2020-09-07 LAB — ERYTHROPOIETIN: Erythropoietin: 49.5 m[IU]/mL — ABNORMAL HIGH (ref 2.6–18.5)

## 2020-09-19 NOTE — Progress Notes (Signed)
[facility]  [address], [address] [place], [place] [address] Phone: [phone]  Fax: [phone]   Telephone Office Visit:  09/20/2020  Referring physician: Garald Balding*  I connected with Connie Olsen on 09/20/20 at 3:39 PM by telephone and verified that I was speaking with the correct person using 2 identifiers.  The patient was at home.  I discussed the limitations, risk, security and privacy concerns of performing an evaluation and management service by videoconferencing and the availability of in person appointments.  I also discussed with the patient that there may be a patient responsible charge related to this service.  The patient expressed understanding and agreed to proceed.   Chief Complaint: Connie Olsen is a 75 y.o. female with a history of stage II gallbladder cancer, left breast DCIS, erythrocytosis, and end-stage renal disease on dialysis who is seen for review of work-up and discussion regarding direction of therapy.  HPI:  The patient was last seen in the medical oncology clinic on 09/06/2020. At that time, she felt "so-so."  Her whole body hurt. 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 Her blood pressure always ran low; a "good" blood pressure reading for her was 89/54. She was taking midodrine halfway through dialysis. She had dialysis the day before.  She was noted to have an elevated hemoglobin in dialysis and noted that her blood was being "dumped".  Hemoglobin was 16.4 on 06/07/2020.  Work-up for erythrocytosis on 09/06/2020 revealed a hematocrit of 46.2, hemoglobin 14.8, platelets 242,000, WBC 7,700 ([place] 6200). Ferritin was 419 with an iron saturation of 29% and a TIBC of 196. Carbon monoxide was 4.3% (0-3.6%). Erythropoietin level was 49.5 (2.6 - 18.5).  During the interim, she has been "ok". Her weight is stable. Whole body pain, arthritis from neck to  toes, wound on her leg, and nausea are stable. She is seeing the wound center next week for an x ray. She always clears her throat. She needs cataract surgery.  She takes 6 tablets of phosphorous binder per day which has iron in it.  She does not smoke and denies second hand smoke. She has a small gas fireplace that she uses rarely. Her son installed a carbon monoxide monitor in her house.   Past Medical History:  Diagnosis Date  . Barrett's esophagus without dysplasia   . Cholangiocarcinoma ([place])   . Chronic kidney disease   . 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 Endometrial thickening on ultra sound 04/19/2015  . History of ulcerative colitis   . History of uterine fibroid   . Osteoporosis     Past Surgical History:  Procedure Laterality Date  . CHOLECYSTECTOMY    . colonoscopy  05/08/2016  . LAPAROSCOPIC PARTIAL HEPATECTOMY    . MASTECTOMY PARTIAL / LUMPECTOMY    . UPPER GI ENDOSCOPY  05/08/2016    Family History  Problem Relation Age of Onset  . Breast cancer Mother     Social History:  reports that she has never smoked. She has never used smokeless tobacco. She reports that she does not drink alcohol and does not use drugs. She is from [place]. She has had multiple jobs. Her husband worked in [employer] so they moved around a lot.  She lives in [place].  The patient is alone today.  Participants in the patient's visit and their role in the encounter included the patient and  Connie Carnes, RN. The intake visit was provided by Connie Carnes RN.  Allergies:  Allergies  Allergen Reactions  . Aspirin Other (See Comments)    Ulcerative colitis: UNABLE TO TOLERATE IF NOT ENTERIC COATED Ulcerative colitis: UNABLE TO TOLERATE IF NOT ENTERIC COATED  . Brassica Napus Seed Oil Other (See Comments)  . Naltrexone Other (See Comments)  . Librax  [Chlordiazepoxide-Clidinium] Anxiety    agitation agitation  . Povidone Iodine Itching and Rash  . Povidone-Iodine  Itching and Rash  . Tape Itching    Current Medications: Current Outpatient Medications  Medication Sig Dispense Refill  . aspirin EC 81 MG tablet Take 81 mg by mouth daily.     . B Complex-C-Folic Acid (DIALYVITE TABLET) TABS Take 1 tablet by mouth daily.     . Bismuth Subsalicylate 539 JQ/73AL SUSP Take 15 mLs by mouth daily. As directed as needed    . Darbepoetin Alfa (ARANESP) 100 MCG/0.5ML SOSY injection Inject 100 mcg into the vein every 7 (seven) days.     . ferric citrate (AURYXIA) 1 GM 210 MG(Fe) tablet Take 420 mg by mouth 3 (three) times daily.    Marland Kitchen ibuprofen (ADVIL) 200 MG tablet Take 200 mg by mouth as needed.    . metoprolol tartrate (LOPRESSOR) 25 MG tablet 25 mg 2 (two) times daily.   1  . midodrine (PROAMATINE) 10 MG tablet Take by mouth.    Marland Kitchen omeprazole (PRILOSEC) 20 MG capsule   8  . pregabalin (LYRICA) 25 MG capsule Take 25 mg by mouth 3 (three) times daily.    . SENSIPAR 60 MG tablet TAKE 1 TABLET BY MOUTH ONCE A DAY WITH MEALS  11  . acetaminophen (TYLENOL) 500 MG tablet Take 500 mg by mouth every 6 (six) hours as needed for moderate pain.  (Patient not taking: Reported on 09/20/2020)    . amitriptyline (ELAVIL) 10 MG tablet Take 10 mg by mouth at bedtime. (Patient not taking: Reported on 09/20/2020)    . amoxicillin (AMOXIL) 500 MG tablet TAKE 4 TABLETS BY MOUTH ONCE FOR 1 DOSE. TAKE 30 60 MINUTES PRIOR TO DENTAL PROCEDURE (Patient not taking: No sig reported)    . anastrozole (ARIMIDEX) 1 MG tablet Take 1 mg by mouth daily. (Patient not taking: Reported on 09/20/2020)    . Baclofen 5 MG TABS Take 2.5 mg by mouth 2 (two) times daily as needed (Spasm, back pain). (Patient not taking: No sig reported) 10 tablet 0  . diazepam (VALIUM) 10 MG tablet TAKE 1 TABLET BY MOUTH 1 HOUR PRIOR TO DENTAL TREATMENT (Patient not taking: No sig reported)    . Heparin Lock Flush (HEPARIN FLUSH, PORCINE,) 100 UNIT/ML injection 100U/ml Flush port with 55ml every 30 days. (Patient not taking:  Reported on 09/20/2020)    . lidocaine-prilocaine (EMLA) cream Apply to access before dialysis (Patient not taking: Reported on 09/20/2020)    . oxyCODONE (OXY IR/ROXICODONE) 5 MG immediate release tablet Take 5 mg by mouth. 4 - 5 times daily as needed (Patient not taking: No sig reported)    . sodium chloride 0.9 % injection 34ml of NS flush into port once every 30 days. Follow with heparin flush (Patient not taking: Reported on 09/20/2020)    . traMADol (ULTRAM) 50 MG tablet Take 1 tablet (50 mg total) by mouth every 12 (twelve) hours as needed. (Patient not taking: No sig reported) 10 tablet 0   No current facility-administered medications for this visit.    Review of Systems  Constitutional: Positive for weight loss (1 lb). Negative for chills, diaphoresis, fever and malaise/fatigue.  HENT: Negative for congestion, ear discharge, ear pain, hearing loss, nosebleeds, sinus pain, sore throat and tinnitus.   Eyes: Negative for blurred vision and double vision.       Needs cataract surgery.  Respiratory: Positive for sputum production (phlegm, needs to clear her throat). Negative for cough, hemoptysis and shortness of breath.   Cardiovascular: Negative for chest pain, palpitations and leg swelling.       Blood pressure runs low.  Gastrointestinal: Positive for nausea (on and off). Negative for abdominal pain, blood in stool, constipation, diarrhea, heartburn, melena and vomiting.  Genitourinary: Negative for dysuria, hematuria and urgency.       Began dialysis 11/2012.  Musculoskeletal: Positive for joint pain (arthritis from neck to toes). Negative for back pain, myalgias and neck pain.  Skin: Negative for itching and rash.       Wound on leg that won't heal.  Neurological: Positive for sensory change (numbness in fingers and hands). Negative for dizziness, tingling, speech change, focal weakness, weakness and headaches.  Endo/Heme/Allergies: Does not bruise/bleed easily.       Type II  diabetes.  Psychiatric/Behavioral: Negative.  Negative for depression and memory loss. The patient is not nervous/anxious and does not have insomnia.   All other systems reviewed and are negative.  Performance status (ECOG): 1  Vitals There were no vitals taken for this visit.    No visits with results within 3 Day(s) from this visit.  Latest known visit with results is:  Infusion on 09/06/2020  Component Date Value Ref Range Status  . Iron 09/06/2020 57  28 - 170 ug/dL Final  . TIBC 09/06/2020 196* 250 - 450 ug/dL Final  . Saturation Ratios 09/06/2020 29  10.4 - 31.8 % Final  . UIBC 09/06/2020 139  ug/dL Final   Performed at [facility], [address] [place], [place] [address]  . Carbon Monoxide, Blood 09/06/2020 4.3* 0.0 - 3.6 % Final   Comment: (NOTE)                            Environmental Exposure:                             Nonsmokers           <3.7                             Smokers              <9.9                            Occupational Exposure:                             BEI                   3.5                                Detection Limit =  0.2 Performed At: [facility] [place], [place] [id number] Rush Farmer MD ME:[id number]   . Erythropoietin 09/06/2020 49.5* 2.6 -  18.5 mIU/mL Final   Comment: (NOTE) [facility]man [employer] UniCel DxI [place] obtained with different assay methods or kits cannot be used interchangeably. Results cannot be interpreted as absolute evidence of the presence or absence of malignant disease. Performed At: [facility] [place], [place] [id number] Rush Farmer MD NI:[id number]   . WBC 09/06/2020 7.7  4.0 - 10.5 K/uL Final  . RBC 09/06/2020 5.08  3.87 - 5.11 MIL/uL Final  . Hemoglobin 09/06/2020 14.8  12.0 - 15.0 g/dL Final  . HCT 09/06/2020 46.2* 36.0 - 46.0 % Final  . MCV 09/06/2020 90.9  80.0 - 100.0 fL Final  . MCH 09/06/2020 29.1  26.0 - 34.0  pg Final  . MCHC 09/06/2020 32.0  30.0 - 36.0 g/dL Final  . RDW 09/06/2020 15.9* 11.5 - 15.5 % Final  . Platelets 09/06/2020 242  150 - 400 K/uL Final  . nRBC 09/06/2020 0.0  0.0 - 0.2 % Final  . Neutrophils Relative % 09/06/2020 82  % Final  . Neutro Abs 09/06/2020 6.2  1.7 - 7.7 K/uL Final  . Lymphocytes Relative 09/06/2020 6  % Final  . Lymphs Abs 09/06/2020 0.5* 0.7 - 4.0 K/uL Final  . Monocytes Relative 09/06/2020 7  % Final  . Monocytes Absolute 09/06/2020 0.6  0.1 - 1.0 K/uL Final  . Eosinophils Relative 09/06/2020 4  % Final  . Eosinophils Absolute 09/06/2020 0.3  0.0 - 0.5 K/uL Final  . Basophils Relative 09/06/2020 1  % Final  . Basophils Absolute 09/06/2020 0.1  0.0 - 0.1 K/uL Final  . Immature Granulocytes 09/06/2020 0  % Final  . Abs Immature Granulocytes 09/06/2020 0.03  0.00 - 0.07 K/uL Final   Performed at [facility], [address] [place], [place] [address]  . Ferritin 09/06/2020 419* 11 - 307 ng/mL Final   Performed at [facility], [address] [place], [place] [address]    Assessment:  Connie Olsen is a 75 y.o. female with a history of stage II gallbladder cancer, left breast DCIS, and ESRD on dialysis.   She has a history of gallbladder cancer s/p laparoscopic cholecystectomy and liver wedge biopsy by Dr Mariah Milling at [facility] on 04/15/2014.  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  Tumor invaded perimuscular connective tissue with no extension beyond the serosa or into the liver.  Margins were uninvolved (closest 0.5 mm).  There is no lymphovascular or perineural invasion.  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  One lymph node was negative.  Pathologic stage was pT2 pN0.   Abdomen and pelvis CT on 12/30/2018 showed no definite metastatic disease within the chest, abdomen, or pelvis. There was minimally increased  conspicuity of a hypodense left hepatic lesion, favor benign in etiology such as focal fat given relative stability since 2017. Attention on follow up imaging was recommended.  There was no significant change in the mildy prominent aortocaval lymph node.   Chest abdomen and pelvis CT with contrast and MIPS on 06/30/2019 revealed no evidence of metastatic disease in the chest, abdomen, or pelvis. There was similar appearance of low-attenuation in the left hepatic lobe favoring focal fatty infiltration. The aortocaval lymph node was less conspicuous on this study.  Chest, abdomen, and pelvis CT on 06/07/2020 revealed no metastatic disease.   CA19-9 has been followed: 87 on 10/15/2014, 165 on 01/21/2015, 92 on 03/22/2015, 131 on 09/20/2015,  168 on 03/20/2016, 89 on 09/18/2016, 143 on 09/24/2017, 495 on 09/16/2018, 140 on 12/30/2018, and 167 on 06/07/2020.  She has a history of DCIS left breast (11/2014) s/p multiple excisions with + margins.  DCIS was ER and PR positive.  She declined further surgery or radiation.  She remains on Armidex.  She is followed by Dr. Deitra Mayo at [facility].  Diagnostic bilateral mammogram on 12/31/2019 revealed no evidence of malignancy.  She is finishing her last bottle of Arimidex next month.  She is on dialysis 3 times a week.   She has had recurrent episodes of AV-fistula/graft thrombosis.  She has been seen in the hematology clinic by Dr. Dyann Kief.   Thrombophilia work-up was not recommended.  Anticardiolipin antibody IgM was > 150 on 11/28/2016 and 06/06/2017.  Beta-2 glycoprotein was > 150 on 11/28/2016 and 06/06/2017.  Lupus anticoagulant panel was + on 12/24/2016 and 06/06/2017.   Bone density on 11/11/2017 revealed osteopenia with a T score of -1.40 at the right neck femur and osteoporosis with a T-score of -3.3 in the left radius. Bone density on 03/22/2020 revealed osteoporosis with a T score of -2.8 at the right total femur. She is not a candidate for Zometa given  her ESRD.  She has IBS.  Last colonoscopy was on 05/08/2016.  She has Barrett's esophagus. Last EGD was on 05/23/2017.  Echo on 03/27/2019 revealed ejection fraction > 55%.   She has erythrocytosis.  The dialysis center has been  "dumping" her blood for the past few weeks due to her high hemoglobin.  Per report, she has not had an ESA in a year. She takes Auryxia (Ferric Citrate Tablets).  Work-up on 09/06/2020 revealed a hematocrit of 46.2, hemoglobin 14.8, platelets 242,000, WBC 7,700 ([address] 6[address]).  Ferritin was 419 with an iron saturation of 29% and a TIBC of 196. Carbon monoxide was 4.3% (0-3.6%). Erythropoietin level was 49.5 (2.6 - 18.5).  The patient has received both doses of the COVID-19 vaccine.  Symptomatically, she feels "ok".  Weight is stable. She has whole body pain and arthritis from neck to toes.  She sees the wound clinic for a wound on her leg.  She takes a phosphate binder with iron.  Plan: 1.   Labs today:  JAK2 with reflex. 2.   Erythrocytosis  Hematocrit 52.5. Hemoglobin 17.1 and MCV 95.0 on 06/30/2019.  Hematocrit 48.8. Hemoglobin 15.6 and MCV 94.0 on 12/01/2019.  Hematocrit 50.3. Hemoglobin 16.4 and MCV 93.0 on 06/07/2020.  Hematocrit 46.2. Hemoglobin 14.8 and MCV 90.9 on 09/06/2020.  She is on dialysis.  She has not been on an ESA in a year per report.  She undergoes blood "wasting" every 4 dialysis sessions.  Review current work-up.  Erythropoietin level is elevated.   Differential for an elevated epo level includes HCC, renal cell carcinoma, hemangioblastoma, pheochromocytoma, uterine leiomyomata.  Discuss plans for additional labs (JAK2 and AFP) with next blood draw.   Consider 24 hour urine for catecholamines and metanephrines.  Consider imaging.     Patient's last chest, abdomen pelvis CT scan was on 06/07/2020. 3.   Stage II gallbladder cancer   Symptomatically, she appears to be doing well.  Chest, abdomen, and pelvis CT with IV contrast at [facility] on 06/07/2020  revealed no evidence of recurrent disease.  CA19-9 was 167 on 06/07/2020 (fluctuates).  Continue to monitor. 4.   Left breast DCIS  She has a h/o + margins.  She declined radiation.  Bilateral diagnostic mammogram on 12/31/2019 revealed no  recurrence.   She has been on Arimidex x 5 years.   She plans to finish her last bottle of Arimidex in 10/2020.  She is followed by Dr Deitra Mayo at [facility]. 5.   Recurrent AV-fistula/graft thrombosis  Patient with + lupus anticoagulant, anti-cardiolipin antibodies, beta-2 glycoprotein antibodies.  She had a AV fistula revision on 06/24/2020.  She is on aspirin.  Follow-up with Dr. Dyann Kief. 6.   Elevated ferritin  Ferritin was 419 with an iron saturation of 29% and a TIBC of 196.  Patient notes phosphate binder contains iron. 7.   Port-a cath maintenance  Patient continues port maintenance in clinic. 8.   RTC on 10/11/2020 for port flush and labs (JAK2 with reflex, AFP). 9.   RTC in 1 month for MD assessment and review of work-up.  I discussed the assessment and treatment plan with the patient.  The patient was provided an opportunity to ask questions and all were answered.  The patient agreed with the plan and demonstrated an understanding of the instructions.  The patient was advised to call back if the symptoms worsen or if the condition fails to improve as anticipated.  I provided 28 minutes (3:37 PM - 4:05 PM) of non face-to-face telephone visit time during this this encounter and > 50% was spent counseling as documented under my assessment and plan.  I provided these services from the [facility] office.    Teancum Brule C. Mike Gip, MD, PhD    09/20/2020, 3:37 PM  I, Mirian Mo Tufford, am acting as Education administrator for [employer]. Mike Gip, MD, PhD.  I, Kherington Meraz C. Mike Gip, MD, have reviewed the above documentation for accuracy and completeness, and I agree with the above.

## 2020-09-20 ENCOUNTER — Inpatient Hospital Stay (HOSPITAL_BASED_OUTPATIENT_CLINIC_OR_DEPARTMENT_OTHER): Payer: Medicare Other | Admitting: Hematology and Oncology

## 2020-09-20 ENCOUNTER — Other Ambulatory Visit: Payer: Self-pay | Admitting: Hematology and Oncology

## 2020-09-20 ENCOUNTER — Telehealth: Payer: Self-pay

## 2020-09-20 ENCOUNTER — Encounter: Payer: Self-pay | Admitting: Hematology and Oncology

## 2020-09-20 DIAGNOSIS — D751 Secondary polycythemia: Secondary | ICD-10-CM

## 2020-09-20 DIAGNOSIS — C23 Malignant neoplasm of gallbladder: Secondary | ICD-10-CM | POA: Diagnosis not present

## 2020-09-20 DIAGNOSIS — R718 Other abnormality of red blood cells: Secondary | ICD-10-CM

## 2020-09-20 DIAGNOSIS — D0512 Intraductal carcinoma in situ of left breast: Secondary | ICD-10-CM | POA: Diagnosis not present

## 2020-09-20 NOTE — Telephone Encounter (Signed)
Spoke with dialysis nurse @ [facility]. He states that patient has not received any ESA's in the last year and that her HGB typically runs high and that they waste her blood every 4 treatments or so.   Phone for dialysis center for future reference: [phone]. Fax [phone]

## 2020-09-20 NOTE — Progress Notes (Signed)
Confirmed patient name and DOB for telephone assessment. Patient wants to know what her lab results are and reports occasional SOB on exertion.

## 2020-10-03 ENCOUNTER — Emergency Department: Payer: Medicare Other

## 2020-10-03 ENCOUNTER — Other Ambulatory Visit: Payer: Self-pay

## 2020-10-03 ENCOUNTER — Emergency Department
Admission: EM | Admit: 2020-10-03 | Discharge: 2020-10-03 | Disposition: A | Discharge status: Home / Self Care | Payer: Medicare Other | Attending: Emergency Medicine | Admitting: Emergency Medicine

## 2020-10-03 DIAGNOSIS — S0990XA Unspecified injury of head, initial encounter: Secondary | ICD-10-CM | POA: Diagnosis present

## 2020-10-03 DIAGNOSIS — E1122 Type 2 diabetes mellitus with diabetic chronic kidney disease: Secondary | ICD-10-CM | POA: Insufficient documentation

## 2020-10-03 DIAGNOSIS — R0781 Pleurodynia: Secondary | ICD-10-CM | POA: Diagnosis not present

## 2020-10-03 DIAGNOSIS — Z992 Dependence on renal dialysis: Secondary | ICD-10-CM | POA: Diagnosis not present

## 2020-10-03 DIAGNOSIS — Z7982 Long term (current) use of aspirin: Secondary | ICD-10-CM | POA: Diagnosis not present

## 2020-10-03 DIAGNOSIS — W19XXXA Unspecified fall, initial encounter: Secondary | ICD-10-CM

## 2020-10-03 DIAGNOSIS — N186 End stage renal disease: Secondary | ICD-10-CM | POA: Insufficient documentation

## 2020-10-03 DIAGNOSIS — T07XXXA Unspecified multiple injuries, initial encounter: Secondary | ICD-10-CM

## 2020-10-03 DIAGNOSIS — W01198A Fall on same level from slipping, tripping and stumbling with subsequent striking against other object, initial encounter: Secondary | ICD-10-CM | POA: Insufficient documentation

## 2020-10-03 DIAGNOSIS — S0003XA Contusion of scalp, initial encounter: Secondary | ICD-10-CM | POA: Insufficient documentation

## 2020-10-03 MED ORDER — OXYCODONE HCL 5 MG PO TABS: 5.0000 mg | ORAL_TABLET | Freq: Four times a day (QID) | ORAL | 0 refills | Status: DC | PRN

## 2020-10-03 NOTE — Discharge Instructions (Addendum)
Follow-up with your primary care provider if any continued problems.  You will be more sore tomorrow than you are currently.  There are no fractures but your x-rays did show degenerative arthritis.  You may take the pain medication you have at home as needed for pain.  Be aware that this medication could cause drowsiness and increase your risk for falling.  If you do not have medication at home then a prescription was sent to the pharmacy for approximately 2 days.  If additional pain medication is needed you will need to contact your primary care provider.  If any worsening of your symptoms return to the emergency department.

## 2020-10-03 NOTE — ED Triage Notes (Addendum)
Pt comes via EMS from home with c/o fall. Pt states she tripped and fell while trying to pull her bag out of her cart. Pt states she was leaving dialysis.  Pt states since incident she has felt dizzy. Pt states last Friday she had a syncopal episode.  Pt states she did hit her head. Pt denies any LOC or blood thinners.

## 2020-10-03 NOTE — ED Notes (Signed)
Unable to draw blood at this time due to bilateral arm restrictions. Pt has port but needs to be accessed. Pt verbalized understanding that we can access it once she in in a room due to needing to maintain sterile techniques.

## 2020-10-03 NOTE — ED Triage Notes (Signed)
Pt comes into the ED via EMS from dialysis center states she tripped and fell trying to her walker out of the car and is c/o buttock, BL rib pain.

## 2020-10-03 NOTE — ED Provider Notes (Signed)
[facility] Emergency Department Provider Note   ____________________________________________   Event Date/Time   First MD Initiated Contact with Patient 10/03/20 1410     (approximate)  I have reviewed the triage vital signs and the nursing notes.   HISTORY  Chief Complaint Fall   HPI Connie Olsen is a 75 y.o. female presents to the ED via EMS after a fall at the dialysis center.  Patient states that she lost her balance while trying to get her walker out of her car.  Patient states that she landed backwards landing on her buttocks.  She hit her head and also has pain bilateral rib area.  Patient denies any LOC.  She is not currently taking any blood thinners.  She also gives a history of dizziness versus falling asleep and falling out of the dialysis chair on Friday.  Patient states that she remembers hearing Rachael Ray on TV and then had people around her.  Patient states that she landed on the left side of her head.  She was not seen by medical personnel at that time and does not feel that this was a syncopal episode rather than she fell asleep during dialysis.  She denies any blurred vision, nausea, vomiting, dizziness since Friday.  Patient states that after her fall on Friday she did not have any difficulties over the weekend other than being sore from her arthritis.  She rates her pain as 7 out of 10.       Past Medical History:  Diagnosis Date  . Barrett's esophagus without dysplasia   . Cholangiocarcinoma ([place])   . Chronic kidney disease   . 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 Endometrial thickening on ultra sound 04/19/2015  . History of ulcerative colitis   . History of uterine fibroid   . Osteoporosis     Patient Active Problem List   Diagnosis Date Noted  . Erythrocytosis 09/06/2020  . Chronic hypotension 06/16/2020  . Complication associated with dialysis catheter 06/16/2020  . Hyperlipidemia 06/16/2020  .  Ulcerative colitis ([place]) 06/16/2020  . Bilateral hand pain 02/02/2020  . Bilateral hand numbness 12/10/2019  . Burning sensation 12/10/2019  . Abnormality of albumin 10/15/2019  . Allergy, unspecified, initial encounter 10/13/2019  . Anaphylactic shock, unspecified, initial encounter 10/13/2019  . Hyperparathyroidism ([place]) 09/30/2019  . Hyperphosphatemia 09/30/2019  . Neuropathy 09/30/2019  . Depression 09/30/2019  . Port-A-Cath in place 06/23/2019  . Pruritus, unspecified 09/29/2018  . Insomnia 06/20/2017  . Seborrheic keratoses 06/20/2017  . Primary hypercoagulable state ([place]) 05/21/2017  . Other specified complication of vascular prosthetic devices, implants and grafts, initial encounter ([place]) 05/01/2017  . Shortness of breath 02/27/2017  . Other chronic pain 09/22/2016  . Irritable bowel syndrome 05/24/2016  . Barrett's esophagus without dysplasia 05/16/2016  . Encounter for fitting and adjustment of extracorporeal dialysis catheter ([employer]) 05/16/2016  . Unspecified open wound of left forearm, initial encounter 01/02/2016  . Osteoporosis 11/30/2015  . Hypocalcemia 11/04/2015  . Mild protein-calorie malnutrition ([place]) 06/20/2015  . 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 Encounter for immunization 06/16/2015  . Endometrial thickening on ultrasound 04/19/2015  . Generalized abdominal pain 04/04/2015  . H/O female genital system disorder 03/08/2015  . H/O chronic ulcerative colitis 03/08/2015  . Poor venous access 03/04/2015  . Anemia in chronic kidney disease 02/12/2015  . Bacteremia 02/12/2015  . Coagulation defect, unspecified ([place]) 02/12/2015  . Type 2 diabetes mellitus with other diabetic  kidney complication ([place]) [id number]/[id number]  . Hypokalemia 02/12/2015  . Iron deficiency anemia, unspecified 02/12/2015  . Other ascites 02/12/2015  . Secondary hyperparathyroidism of renal origin ([place]) 02/12/2015  . BP (high blood pressure) 11/16/2014  . Adiposity  11/16/2014  . Hx of abnormal mammogram 11/16/2014  . 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 A-fib ([place]) 02/04/2014  . 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 CHOLECYSTECTOMY    . colonoscopy  05/08/2016  . LAPAROSCOPIC PARTIAL HEPATECTOMY    . MASTECTOMY PARTIAL / LUMPECTOMY    . UPPER GI ENDOSCOPY  05/08/2016    Prior to Admission medications   Medication Sig Start Date End Date Taking? Authorizing Provider  oxyCODONE (OXY IR/ROXICODONE) 5 MG immediate release tablet Take 1 tablet (5 mg total) by mouth every 6 (six) hours as needed for severe pain. 10/03/20  Yes Johnn Hai, PA-C  acetaminophen (TYLENOL) 500 MG tablet Take 500 mg by mouth every 6 (six) hours as needed for moderate pain.  Patient not taking: Reported on 09/20/2020    [provider]  amitriptyline (ELAVIL) 10 MG tablet Take 10 mg by mouth at bedtime. Patient not taking: Reported on 09/20/2020 06/27/17   [provider]  amoxicillin (AMOXIL) 500 MG tablet TAKE 4 TABLETS BY MOUTH ONCE FOR 1 DOSE. TAKE 30 60 MINUTES PRIOR TO DENTAL PROCEDURE Patient not taking: No sig reported 04/29/19   [provider]  anastrozole (ARIMIDEX) 1 MG tablet Take 1 mg by mouth daily. Patient not taking: Reported on 09/20/2020    [provider]  aspirin EC 81 MG tablet Take 81 mg by mouth daily.     [provider]  B Complex-C-Folic Acid (DIALYVITE TABLET) TABS Take 1 tablet by mouth daily.  07/14/14   [provider]  Baclofen 5 MG TABS Take 2.5 mg by mouth 2 (two) times daily as needed (Spasm, back pain). Patient not taking: No sig reported 06/16/19   Coral Spikes, DO  Bismuth Subsalicylate 115 BW/62MB SUSP Take 15 mLs by mouth daily. As directed as needed    [provider]  Darbepoetin Alfa (ARANESP) 100 MCG/0.5ML SOSY injection Inject 100 mcg into the  vein every 7 (seven) days.  03/05/14   [provider]  diazepam (VALIUM) 10 MG tablet TAKE 1 TABLET BY MOUTH 1 HOUR PRIOR TO DENTAL TREATMENT Patient not taking: No sig reported 01/02/19   [provider]  ferric citrate (AURYXIA) 1 GM 210 MG(Fe) tablet Take 420 mg by mouth 3 (three) times daily. 09/05/18   [provider]  Heparin Lock Flush (HEPARIN FLUSH, PORCINE,) 100 UNIT/ML injection 100U/ml Flush port with 29ml every 30 days. Patient not taking: Reported on 09/20/2020 04/12/15   [provider]  ibuprofen (ADVIL) 200 MG tablet Take 200 mg by mouth as needed. 04/05/15   [provider]  lidocaine-prilocaine (EMLA) cream Apply to access before dialysis Patient not taking: Reported on 09/20/2020 06/04/14   [provider]  metoprolol tartrate (LOPRESSOR) 25 MG tablet 25 mg 2 (two) times daily.  08/14/16   [provider]  midodrine (PROAMATINE) 10 MG tablet Take by mouth. 08/10/15   [provider]  omeprazole (PRILOSEC) 20 MG capsule  11/09/16   [provider]  pregabalin (LYRICA) 25 MG capsule Take 25 mg by mouth 3 (three) times daily. 06/22/20   [provider]  SENSIPAR 60 MG tablet TAKE 1 TABLET BY MOUTH ONCE A DAY WITH MEALS 03/11/15   [provider]  sodium chloride 0.9 % injection 56ml of NS flush into port once every 30 days. Follow with heparin flush Patient not taking: Reported on 09/20/2020 04/12/15   [provider]  traMADol (ULTRAM) 50 MG tablet Take 1 tablet (50 mg total) by mouth every 12 (twelve) hours as needed. Patient not taking: No sig reported 06/16/19   Coral Spikes, DO    Allergies Aspirin, Brassica napus seed oil, Naltrexone, Librax  [chlordiazepoxide-clidinium], Povidone iodine, Povidone-iodine, and Tape  Family History  Problem Relation Age of Onset  . Breast cancer Mother     Social History Social History   Tobacco Use  . Smoking status: Never Smoker  . Smokeless  tobacco: Never Used  Vaping Use  . Vaping Use: Never used  Substance Use Topics  . Alcohol use: No    Alcohol/week: 0.0 standard drinks  . Drug use: No    Review of Systems Constitutional: No fever/chills Eyes: No visual changes. ENT: No trauma. Cardiovascular: Denies chest pain. Respiratory: Denies shortness of breath. Gastrointestinal: No abdominal pain.  No nausea, no vomiting.   Musculoskeletal: Positive bilateral rib pain, and bilateral pain buttocks area. Skin: Negative for rash.  No open skin wounds. Neurological: Negative for headaches, focal weakness or numbness. ____________________________________________   PHYSICAL EXAM:  VITAL SIGNS: ED Triage Vitals  Enc Vitals Group     BP 10/03/20 1311 (!) 124/109     Pulse Rate 10/03/20 1311 68     Resp 10/03/20 1311 18     Temp 10/03/20 1311 97.8 F (36.6 C)     Temp Source 10/03/20 1311 Oral     SpO2 10/03/20 1311 94 %     Weight --      Height --      Head Circumference --      Peak Flow --      Pain Score 10/03/20 1314 7     Pain Loc --      Pain Edu? --      Excl. in [place]? --     Constitutional: Alert and oriented. Well appearing and in no acute distress. Eyes: Conjunctivae are normal.  Head: No deformity but there is scalp tenderness noted posteriorly.  Skin is intact. Neck: No stridor.  Nontender cervical spine palpation posteriorly.  No discoloration noted of the skin. Cardiovascular: Normal rate, regular rhythm. Grossly normal heart sounds.  Good peripheral circulation. Respiratory: Normal respiratory effort.  No retractions. Lungs CTAB.  Patient does have some tenderness bilateral ribs but no gross deformity or soft tissue discoloration present.  Tenderness is noted mostly the low the seventh and eighth rib area. Gastrointestinal: Soft and nontender. No distention.  Bowel sounds normoactive x4 quadrants. Musculoskeletal: No tenderness is noted on palpation of the thoracic spine however the lower lumbar spine  is moderately tender and the sacral area.  Patient has some minimal tenderness noted on the left hip area but range of motion is unrestricted.  No tenderness is noted bilateral knees or ankles. Neurologic:  Normal speech and language. No gross focal neurologic deficits are appreciated.  Skin:  Skin is warm, dry and intact.  Psychiatric: Mood and affect are normal. Speech and behavior are normal.  ____________________________________________   LABS (all labs ordered are listed, but only abnormal results are displayed)  Labs Reviewed - No data to display ____________________________________________  RADIOLOGY Leana Gamer, personally viewed  and evaluated these images (plain radiographs) as part of my medical decision making, as well as reviewing the written report by the radiologist.   Official radiology report(s): DG Chest 2 View  Result Date: 10/03/2020 CLINICAL DATA:  75 year old female with bilateral rib pain. EXAM: CHEST - 2 VIEW COMPARISON:  Chest radiograph dated 04/15/2018. FINDINGS: Right-sided Port-A-Cath and left dialysis catheter in similar position. No focal consolidation, pleural effusion or pneumothorax. Stable cardiac silhouette. Atherosclerotic calcification of the aorta. No acute osseous pathology. Degenerative changes of spine. Vascular stent in the right upper extremity. IMPRESSION: No active cardiopulmonary disease. Electronically Signed   By: Anner Crete M.D.   On: 10/03/2020 15:12   DG Lumbar Spine 2-3 Views  Result Date: 10/03/2020 CLINICAL DATA:  Fall, back pain EXAM: LUMBAR SPINE - 2-3 VIEW COMPARISON:  06/16/2019 FINDINGS: Five non rib bearing segments of the lumbar spine. Mild lumbar levoscoliosis, apex left at L3. No acute fracture or listhesis of the lumbar spine. Vertebral body height has been preserved. There is intervertebral disc space narrowing and endplate remodeling of B16-X4, most severe at T12-L1 in keeping with changes of moderate to severe  degenerative disc disease. Advanced vascular calcifications are seen within the abdominal aorta and visceral arterial vasculature. Multiple coarse calcifications within the pelvis likely relate to underlying uterine fibroids. The paraspinal soft tissues are otherwise unremarkable. IMPRESSION: No acute fracture or listhesis. Diffuse moderate to severe degenerative disc disease. Electronically Signed   By: Fidela Salisbury MD   On: 10/03/2020 15:10   DG Pelvis 1-2 Views  Result Date: 10/03/2020 CLINICAL DATA:  Fall, pelvic pain EXAM: PELVIS - 1-2 VIEW COMPARISON:  None. FINDINGS: Single view radiograph of the pelvis demonstrates normal alignment. No fracture or dislocation. Bilateral hip joint spaces are preserved. Advanced vascular calcifications are seen within the pelvis and medial thighs. Probable involuted uterine fibroids within the pelvis. IMPRESSION: No acute fracture or dislocation Electronically Signed   By: Fidela Salisbury MD   On: 10/03/2020 15:11   CT HEAD WO CONTRAST  Result Date: 10/03/2020 CLINICAL DATA:  Headache, new or worsening. Additional history provided by scanning technologist: Patient presents following trip and fall. EXAM: CT HEAD WITHOUT CONTRAST TECHNIQUE: Contiguous axial images were obtained from the base of the skull through the vertex without intravenous contrast. COMPARISON:  No pertinent prior exams available for comparison. FINDINGS: Brain: Mild cerebral and cerebellar atrophy. Small chronic cortically based infarct within the left occipital lobe. Hypoattenuation within the cerebral white matter is nonspecific, but compatible with chronic small vessel ischemic disease. 6 mm rounded dural-based calcification overlying the left parietal lobe. There is no acute intracranial hemorrhage. No acute demarcated cortical infarct. No extra-axial fluid collection. No midline shift. Vascular: No hyperdense vessel.  Atherosclerotic calcifications Skull: Normal. Negative for fracture or focal  lesion. Sinuses/Orbits: Visualized orbits show no acute finding. Mild bilateral maxillary sinus mucosal thickening. Small left maxillary sinus mucous retention cyst. IMPRESSION: No evidence of acute intracranial abnormality. 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 Mild generalized atrophy of the brain and chronic small vessel ischemic disease. 6 mm rounded dural-based calcification overlying the left parietal lobe. Although nonspecific, a small incidental densely calcified meningioma is difficult to exclude Mild bilateral maxillary sinus disease, as described. Electronically Signed   By: Kellie Simmering DO   On: 10/03/2020 13:45    ____________________________________________   PROCEDURES  Procedure(s) performed (including Critical Care):  Procedures   ____________________________________________   INITIAL IMPRESSION / ASSESSMENT AND PLAN / ED COURSE  As  part of my medical decision making, I reviewed the following data within the electronic MEDICAL RECORD NUMBER Notes from prior ED visits and [place] Controlled Substance Database  75 year old female presents to the ED via EMS after she fell at the dialysis center.  Patient states that she was trying to pull her walker out of her car and fell backwards landing on her buttocks and also hitting her head.  She denies any LOC and states that currently her pain is in her buttocks, bilateral rib pain and she is worried that she hit her head.  She denies any nausea, vomiting or visual changes.  Physical exam is positive for bilateral rib tenderness but no gross deformity was noted.  Patient is also tender bilateral buttocks area.  X-rays and CT of her head showed no acute changes and patient was made aware.  She is also aware that she has a lot of arthritis and most likely this will hurt worse tomorrow than it does currently.  Family members also with patient in the room.  Patient believes that she has some leftover medication for pain from  October when she had a procedure done at [facility].  In the event that she does not a prescription for oxycodone immediate release 5 mg was sent to her pharmacy number 8 tablets.  Patient was made aware that if she gets home and does not find her medication that there is some at her pharmacy.  She has taken this medication before without any side effects. ____________________________________________   FINAL CLINICAL IMPRESSION(S) / ED DIAGNOSES  Final diagnoses:  Multiple contusions  Contusion of scalp, initial encounter  Fall, initial encounter     ED Discharge Orders         Ordered    oxyCODONE (OXY IR/ROXICODONE) 5 MG immediate release tablet  Every 6 hours PRN        10/03/20 1624          *Please note:  Connie Olsen was evaluated in Emergency Department on 10/03/2020 for the symptoms described in the history of present illness. She was evaluated in the context of the global COVID-19 pandemic, which necessitated consideration that the patient might be at risk for infection with the SARS-CoV-2 virus that causes COVID-19. Institutional protocols and algorithms that pertain to the evaluation of patients at risk for COVID-19 are in a state of rapid change based on information released by regulatory bodies including the CDC and federal and state organizations. These policies and algorithms were followed during the patient's care in the ED.  Some ED evaluations and interventions may be delayed as a result of limited staffing during and the pandemic.*   Note:  This document was prepared using Dragon voice recognition software and may include unintentional dictation errors.    Johnn Hai, PA-C 10/03/20 1730    Lavonia Drafts, MD 10/04/20 [phone]

## 2020-10-11 ENCOUNTER — Inpatient Hospital Stay: Payer: Medicare Other

## 2020-10-13 ENCOUNTER — Inpatient Hospital Stay: Payer: Medicare Other

## 2020-10-18 ENCOUNTER — Ambulatory Visit
Admission: RE | Admit: 2020-10-18 | Discharge: 2020-10-18 | Disposition: A | Discharge status: Home / Self Care | Payer: Medicare Other | Source: Home / Self Care | Attending: Hematology and Oncology | Admitting: Hematology and Oncology

## 2020-10-18 ENCOUNTER — Ambulatory Visit
Admission: RE | Admit: 2020-10-18 | Discharge: 2020-10-18 | Disposition: A | Discharge status: Home / Self Care | Payer: Medicare Other | Source: Ambulatory Visit | Attending: Hematology and Oncology | Admitting: Hematology and Oncology

## 2020-10-18 ENCOUNTER — Inpatient Hospital Stay: Payer: Medicare Other | Attending: Hematology and Oncology

## 2020-10-18 ENCOUNTER — Other Ambulatory Visit: Payer: Self-pay

## 2020-10-18 DIAGNOSIS — Z79899 Other long term (current) drug therapy: Secondary | ICD-10-CM | POA: Insufficient documentation

## 2020-10-18 DIAGNOSIS — Z79811 Long term (current) use of aromatase inhibitors: Secondary | ICD-10-CM | POA: Insufficient documentation

## 2020-10-18 DIAGNOSIS — Z7982 Long term (current) use of aspirin: Secondary | ICD-10-CM | POA: Diagnosis not present

## 2020-10-18 DIAGNOSIS — Z170 Estrogen receptor positive status [ER+]: Secondary | ICD-10-CM | POA: Diagnosis not present

## 2020-10-18 DIAGNOSIS — Z8509 Personal history of malignant neoplasm of other digestive organs: Secondary | ICD-10-CM | POA: Insufficient documentation

## 2020-10-18 DIAGNOSIS — Z95828 Presence of other vascular implants and grafts: Secondary | ICD-10-CM

## 2020-10-18 DIAGNOSIS — N186 End stage renal disease: Secondary | ICD-10-CM | POA: Diagnosis not present

## 2020-10-18 DIAGNOSIS — Z992 Dependence on renal dialysis: Secondary | ICD-10-CM | POA: Insufficient documentation

## 2020-10-18 DIAGNOSIS — Z452 Encounter for adjustment and management of vascular access device: Secondary | ICD-10-CM | POA: Diagnosis present

## 2020-10-18 DIAGNOSIS — D0512 Intraductal carcinoma in situ of left breast: Secondary | ICD-10-CM | POA: Diagnosis present

## 2020-10-18 DIAGNOSIS — M810 Age-related osteoporosis without current pathological fracture: Secondary | ICD-10-CM | POA: Insufficient documentation

## 2020-10-18 DIAGNOSIS — D751 Secondary polycythemia: Secondary | ICD-10-CM | POA: Diagnosis not present

## 2020-10-18 DIAGNOSIS — C23 Malignant neoplasm of gallbladder: Secondary | ICD-10-CM

## 2020-10-18 LAB — CBC WITH DIFFERENTIAL/PLATELET
Abs Immature Granulocytes: 0.03 10*3/uL (ref 0.00–0.07)
Basophils Absolute: 0.1 10*3/uL (ref 0.0–0.1)
Basophils Relative: 2 %
Eosinophils Absolute: 0.1 10*3/uL (ref 0.0–0.5)
Eosinophils Relative: 2 %
HCT: 48.8 % — ABNORMAL HIGH (ref 36.0–46.0)
Hemoglobin: 15.5 g/dL — ABNORMAL HIGH (ref 12.0–15.0)
Immature Granulocytes: 1 %
Lymphocytes Relative: 11 %
Lymphs Abs: 0.6 10*3/uL — ABNORMAL LOW (ref 0.7–4.0)
MCH: 28.3 pg (ref 26.0–34.0)
MCHC: 31.8 g/dL (ref 30.0–36.0)
MCV: 89.2 fL (ref 80.0–100.0)
Monocytes Absolute: 0.8 10*3/uL (ref 0.1–1.0)
Monocytes Relative: 14 %
Neutro Abs: 4.2 10*3/uL (ref 1.7–7.7)
Neutrophils Relative %: 70 %
Platelets: 282 10*3/uL (ref 150–400)
RBC: 5.47 MIL/uL — ABNORMAL HIGH (ref 3.87–5.11)
RDW: 15.6 % — ABNORMAL HIGH (ref 11.5–15.5)
WBC: 5.9 10*3/uL (ref 4.0–10.5)
nRBC: 0 % (ref 0.0–0.2)

## 2020-10-18 MED ORDER — HEPARIN SOD (PORK) LOCK FLUSH 100 UNIT/ML IV SOLN
500.0000 [IU] | Freq: Once | INTRAVENOUS | Status: AC
  Administered 2020-10-18: 500 [IU] via INTRAVENOUS
  Filled 2020-10-18: qty 5

## 2020-10-18 MED ORDER — SODIUM CHLORIDE 0.9% FLUSH
10.0000 mL | INTRAVENOUS | Status: DC | PRN
  Administered 2020-10-18 (×2): 10 mL via INTRAVENOUS
  Filled 2020-10-18: qty 10

## 2020-10-18 NOTE — Progress Notes (Signed)
Pt here for port flush, no blood return, pt sent for chest xray for port placement, pt wishes to try cath flow on her next visit, d/ced to home

## 2020-10-19 LAB — PARATHYROID HORMONE, INTACT (NO CA): PTH: 406 pg/mL — ABNORMAL HIGH (ref 15–65)

## 2020-10-19 LAB — AFP TUMOR MARKER: AFP, Serum, Tumor Marker: 1.4 ng/mL (ref 0.0–8.3)

## 2020-10-24 ENCOUNTER — Inpatient Hospital Stay: Payer: Medicare Other | Admitting: Hematology and Oncology

## 2020-11-01 ENCOUNTER — Ambulatory Visit: Payer: Medicare Other

## 2020-11-02 ENCOUNTER — Telehealth: Payer: Self-pay

## 2020-11-02 LAB — CALR + JAK2 E12-15 + MPL (REFLEXED)

## 2020-11-02 LAB — JAK2 V617F, W REFLEX TO CALR/E12/MPL

## 2020-11-02 NOTE — Telephone Encounter (Signed)
-----   Message from Lequita Asal, MD sent at 11/02/2020  4:28 PM EST ----- Regarding: Please forward PTH to PCP and nephrology  ----- Message ----- From: Buel Ream, Lab In [place] Sent: 10/18/2020   2:29 PM EST To: Lequita Asal, MD

## 2020-11-08 ENCOUNTER — Ambulatory Visit: Payer: Medicare Other | Admitting: Hematology and Oncology

## 2020-11-21 NOTE — Progress Notes (Signed)
[facility]  [address], [address] [place], [place] [address] Phone: [phone]  Fax: [phone]   Clinic Day:  11/22/2020  Referring physician: Garald Balding*   Chief Complaint: Connie Olsen is a 75 y.o. female with a history of stage II gallbladder cancer, left breast DCIS, erythrocytosis, and end-stage renal disease on dialysis who is seen for 2 month assessment.  HPI:  The patient was last seen in the medical oncology clinic on 09/20/2020 via telephone. At that time, she felt "ok".  Weight was stable. She had whole body pain and arthritis from neck to toes.  She was seeing the wound clinic for a wound on her leg.  She was taking a phosphate binder with iron.   The patient was seen in the ER on 10/03/2020 after a fall. She was pulling her walker out of the car and fell backwards and hit her head. X-rays and head CT showed no acute changes. She was prescribed oxycodone 5 mg for pain.  Labs on 10/18/2020 revealed a hematocrit of 48.8, hemoglobin 15.5, MCV 89.2, platelets 282,000, WBC 5,900 (ANC 4200). AFP was 1.4. PTH was 406 (15-65). JAK2 V617F, exons 12-15, CALR, and MPL mutations were negative.  During the interim, she has been fine. She has mild chest pressure and is anxious about her lab results. Her leg is healing but she is still seeing the wound care clinic. Her arthritis is stable.  She states that her blood pressure readings are accurate. She takes midodrine. At dialysis, she has not needed to have her blood dumped for several weeks. She does not make urine. She does not know if she has adrenal insufficiency.  She sees Dr. Mariah Milling for gallbladder cancer. Her nephrologist is Dr. Rockwell Germany, though she usually sees Sherlynn Stalls, NP.   Past Medical History:  Diagnosis Date  . Barrett's esophagus without dysplasia   . Cholangiocarcinoma ([place])   . Chronic kidney disease   . 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 Endometrial  thickening on ultra sound 04/19/2015  . History of ulcerative colitis   . History of uterine fibroid   . Osteoporosis     Past Surgical History:  Procedure Laterality Date  . CHOLECYSTECTOMY    . colonoscopy  05/08/2016  . LAPAROSCOPIC PARTIAL HEPATECTOMY    . MASTECTOMY PARTIAL / LUMPECTOMY    . UPPER GI ENDOSCOPY  05/08/2016    Family History  Problem Relation Age of Onset  . Breast cancer Mother     Social History:  reports that she has never smoked. She has never used smokeless tobacco. She reports that she does not drink alcohol and does not use drugs. She is from [place]. She has had multiple jobs. Her husband worked in [employer] so they moved around a lot.  She lives in [place].  The patient is alone today.  Allergies:  Allergies  Allergen Reactions  . Aspirin Other (See Comments)    Ulcerative colitis: UNABLE TO TOLERATE IF NOT ENTERIC COATED Ulcerative colitis: UNABLE TO TOLERATE IF NOT ENTERIC COATED  . Brassica Napus Seed Oil Other (See Comments)  . Naltrexone Other (See Comments)  . Librax  [Chlordiazepoxide-Clidinium] Anxiety    agitation agitation  . Povidone Iodine Itching, Rash and Other (See Comments)  . Povidone-Iodine Itching and Rash  . Tape Itching    Current Medications: Current Outpatient Medications  Medication Sig Dispense Refill  . aspirin EC 81 MG tablet Take 81 mg by mouth daily.     Marland Kitchen  B Complex-C-Folic Acid (DIALYVITE TABLET) TABS Take 1 tablet by mouth daily.     . Bismuth Subsalicylate 245 YK/99IP SUSP Take 15 mLs by mouth daily. As directed as needed    . Darbepoetin Alfa (ARANESP) 100 MCG/0.5ML SOSY injection Inject 100 mcg into the vein every 7 (seven) days.     . ferric citrate (AURYXIA) 1 GM 210 MG(Fe) tablet Take 420 mg by mouth 3 (three) times daily.    Marland Kitchen ibuprofen (ADVIL) 200 MG tablet Take 200 mg by mouth as needed.    Marland Kitchen levofloxacin (LEVAQUIN) 500 MG tablet Take 500 mg by mouth every other day.    . metoprolol tartrate  (LOPRESSOR) 25 MG tablet 25 mg 2 (two) times daily.   1  . midodrine (PROAMATINE) 10 MG tablet Take by mouth.    Marland Kitchen omeprazole (PRILOSEC) 20 MG capsule   8  . oxyCODONE (OXY IR/ROXICODONE) 5 MG immediate release tablet Take 1 tablet (5 mg total) by mouth every 6 (six) hours as needed for severe pain. 8 tablet 0  . pregabalin (LYRICA) 25 MG capsule Take 25 mg by mouth 3 (three) times daily.    . SENSIPAR 60 MG tablet TAKE 1 TABLET BY MOUTH ONCE A DAY WITH MEALS  11  . silver sulfADIAZINE (SILVADENE) 1 % cream Apply topically daily.    Marland Kitchen acetaminophen (TYLENOL) 500 MG tablet Take 500 mg by mouth every 6 (six) hours as needed for moderate pain.  (Patient not taking: No sig reported)    . amitriptyline (ELAVIL) 10 MG tablet Take 10 mg by mouth at bedtime. (Patient not taking: No sig reported)    . amoxicillin (AMOXIL) 500 MG tablet TAKE 4 TABLETS BY MOUTH ONCE FOR 1 DOSE. TAKE 30 60 MINUTES PRIOR TO DENTAL PROCEDURE (Patient not taking: No sig reported)    . anastrozole (ARIMIDEX) 1 MG tablet Take 1 mg by mouth daily. (Patient not taking: No sig reported)    . Baclofen 5 MG TABS Take 2.5 mg by mouth 2 (two) times daily as needed (Spasm, back pain). (Patient not taking: No sig reported) 10 tablet 0  . diazepam (VALIUM) 10 MG tablet TAKE 1 TABLET BY MOUTH 1 HOUR PRIOR TO DENTAL TREATMENT (Patient not taking: No sig reported)    . Heparin Lock Flush (HEPARIN FLUSH, PORCINE,) 100 UNIT/ML injection 100U/ml Flush port with 66ml every 30 days. (Patient not taking: No sig reported)    . lidocaine-prilocaine (EMLA) cream Apply to access before dialysis (Patient not taking: No sig reported)    . sodium chloride 0.9 % injection 65ml of NS flush into port once every 30 days. Follow with heparin flush (Patient not taking: No sig reported)    . traMADol (ULTRAM) 50 MG tablet Take 1 tablet (50 mg total) by mouth every 12 (twelve) hours as needed. (Patient not taking: No sig reported) 10 tablet 0   No current  facility-administered medications for this visit.   Facility-Administered Medications Ordered in Other Visits  Medication Dose Route Frequency Provider Last Rate Last Admin  . heparin lock flush 100 unit/mL  500 Units Intravenous Once Corcoran, Melissa C, MD      . sodium chloride flush (NS) 0.9 % injection 10 mL  10 mL Intravenous PRN Nolon Stalls C, MD   10 mL at 11/22/20 1314    Review of Systems  Constitutional: Negative for chills, diaphoresis, fever, malaise/fatigue and weight loss (stable).  HENT: Negative for congestion, ear discharge, ear pain, hearing loss, nosebleeds, sinus pain,  sore throat and tinnitus.   Eyes: Negative for blurred vision and double vision.  Respiratory: Negative for cough, hemoptysis, sputum production and shortness of breath.   Cardiovascular: Negative for chest pain, palpitations and leg swelling.       Blood pressure runs low.  Gastrointestinal: Positive for nausea (on and off). Negative for abdominal pain, blood in stool, constipation, diarrhea, heartburn, melena and vomiting.  Genitourinary: Negative for dysuria, hematuria and urgency.       Began dialysis 11/2012.  Musculoskeletal: Positive for joint pain (arthritis from neck to toes). Negative for back pain, myalgias and neck pain.  Skin: Negative for itching and rash.       Wound on leg is healing.  Neurological: Positive for sensory change (numbness in fingers and hands). Negative for dizziness, tingling, speech change, focal weakness, weakness and headaches.  Endo/Heme/Allergies: Does not bruise/bleed easily.       Type II diabetes.  Psychiatric/Behavioral: Negative for depression and memory loss. The patient is nervous/anxious (about her lab results). The patient does not have insomnia.   All other systems reviewed and are negative.  Performance status (ECOG): 1  Vitals Blood pressure (!) 62/40, pulse 65, temperature 97.6 F (36.4 C), temperature source Tympanic, resp. rate 18, weight 171  lb (77.6 kg), SpO2 97 %.   Physical Exam Vitals and nursing note reviewed.  Constitutional:      General: She is not in acute distress.    Appearance: She is not diaphoretic.  Eyes:     General: No scleral icterus.    Conjunctiva/sclera: Conjunctivae normal.  Neurological:     Mental Status: She is alert and oriented to person, place, and time.  Psychiatric:        Behavior: Behavior normal.        Thought Content: Thought content normal.        Judgment: Judgment normal.    No visits with results within 3 Day(s) from this visit.  Latest known visit with results is:  Infusion on 10/18/2020  Component Date Value Ref Range Status  . AFP, Serum, Tumor Marker 10/18/2020 1.4  0.0 - 8.3 ng/mL Final   Comment: (NOTE) Roche Diagnostics Electrochemiluminescence Immunoassay (ECLIA) Values obtained with different assay methods or kits cannot be used interchangeably.  Results cannot be interpreted as absolute evidence of the presence or absence of malignant disease. This test is not interpretable in pregnant females. **Effective November 07, 2020 AFP, Serum, Tumor Marker  reference interval will be changing to:          Age                Female          Female      0 -  7 days        0.0 - 75265.8   0.0 - 75265.8      8 - 30 days        0.0 - 32556.4   0.0 - 32556.4           1 month       0.0 -  1747.8   0.0 -  1600.3           2 months      0.0 -   391.1   0.0 -   550.0           3 months      0.0 -   225.8   0.0 -   339.9  4 months      0.0 -   230.6   0.0 -   230.6           5 months      0.0 -   118.0   0.0 -   234.0           6 months      0.0 -    97.2   0.0 -    97.2      7 - 11 months      0.0 -    60.3   0.0 -    60.3           1 year        0.0 -    21.4   0.0 -                              21.4           2 years       0.0 -     9.4   0.0 -    10.1      3 -  4 years       0.0 -     5.5   0.0 -     5.5           5 years       0.0 -     3.6   0.0 -     4.2      6 - 12  years       0.0 -     3.9   0.0 -     3.9     13 - 17 years       0.0 -     4.3   0.0 -     4.3     18 - 30 years       0.0 -     5.7   0.0 -     4.7     31 - 50 years       0.0 -     6.9   0.0 -     6.4     51 - 80 years       0.0 -     8.4   0.0 -     9.2         >80 years       0.0 -     6.4   0.0 -     8.7 Performed At: [facility] [employer] [address] [place], [place] [id number] Rush Farmer MD [id number]   . WBC 10/18/2020 5.9  4.0 - 10.5 K/uL Final  . RBC 10/18/2020 5.47* 3.87 - 5.11 MIL/uL Final  . Hemoglobin 10/18/2020 15.5* 12.0 - 15.0 g/dL Final  . HCT 10/18/2020 48.8* 36.0 - 46.0 % Final  . MCV 10/18/2020 89.2  80.0 - 100.0 fL Final  . MCH 10/18/2020 28.3  26.0 - 34.0 pg Final  . MCHC 10/18/2020 31.8  30.0 - 36.0 g/dL Final  . RDW 10/18/2020 15.6* 11.5 - 15.5 % Final  . Platelets 10/18/2020 282  150 - 400 K/uL Final  . nRBC 10/18/2020 0.0  0.0 - 0.2 % Final  . Neutrophils Relative % 10/18/2020 70  % Final  . Neutro Abs 10/18/2020 4.2  1.7 - 7.7 K/uL Final  . Lymphocytes Relative 10/18/2020 11  %  Final  . Lymphs Abs 10/18/2020 0.6* 0.7 - 4.0 K/uL Final  . Monocytes Relative 10/18/2020 14  % Final  . Monocytes Absolute 10/18/2020 0.8  0.1 - 1.0 K/uL Final  . Eosinophils Relative 10/18/2020 2  % Final  . Eosinophils Absolute 10/18/2020 0.1  0.0 - 0.5 K/uL Final  . Basophils Relative 10/18/2020 2  % Final  . Basophils Absolute 10/18/2020 0.1  0.0 - 0.1 K/uL Final  . Immature Granulocytes 10/18/2020 1  % Final  . Abs Immature Granulocytes 10/18/2020 0.03  0.00 - 0.07 K/uL Final   Performed at [facility], [address] [place], [place] [address]  . JAK2 GenotypR 10/18/2020 Comment   Final   Comment: (NOTE) Result: NEGATIVE for the JAK2 V617F mutation. Interpretation:  The G to T nucleotide change encoding the V617F mutation was not detected.  This result does not rule out the presence of the JAK2 mutation at a level below the sensitivity  of detection of this assay, or the presence of other mutations within JAK2 not detected by this assay.  This result does not rule out a diagnosis of polycythemia vera, essential thrombocythemia or idiopathic myelofibrosis as the V617F mutation is not detected in all patients with these disorders.   Marland Kitchen BACKGROUND: 10/18/2020 Comment   Final   Comment: (NOTE) JAK2 is a cytoplasmic tyrosine kinase with a key role in signal transduction from multiple hematopoietic growth factor receptors. A point mutation within exon 14 of the JAK2 gene (G3151V) encoding a valine to phenylalanine substitution at position 617 of the JAK2 protein (V617F) has been identified in most patients with polycythemia vera, and in about half of those with either essential thrombocythemia or idiopathic myelofibrosis. The V617F has also been detected, although infrequently, in other myeloid disorders such as chronic myelomonocytic leukemia and chronic neutrophilic luekemia. V617F is an acquired mutation that alters a highly conserved valine present in the negative regulatory JH2 domain of the JAK2 protein and is predicted to dysregulate kinase activity. Methodology: Total genomic DNA was extracted and subjected to TaqMan real-time PCR amplification/detection. Two amplification products per sample were monitored by real-time PCR using primers/probes s                          pecific to JAK2 wild type (WT) and JAK2 mutant V617F. The ABI7900 Absolute Quantitation software will compare the patient specimen valuse to the standard curves and generate percent values for wild type and mutant type. In vitro studies have indicated that this assay has an analytical sensitivity of 1%. References: Baxter EJ, Scott Phineas Real, et al. Acquired mutation of the tyrosine kinase JAK2 in human myeloproliferative disorders. Lancet. 2005 Mar 19-25; 365(9464):1054-1061. Alfonso Ramus Couedic JP. A unique clonal JAK2  mutation leading to constitutive signaling causes polycythaemia vera. Nature. 2005 Apr 28; 434(7037):1144-1148. Kralovics R, Passamonti F, Buser AS, et al. A gain-of-function mutation of JAK2 in myeloproliferative disorders. N Engl J Med. 2005 Apr 28; 352(17):1779-1790.   . Director Review, JAK2 10/18/2020 Comment   Final   Comment: (NOTE) Loni Muse, PhD, [facility]    Director, [place] for [place] and [place], [place] [address]    [phone] This test was developed and its performance characteristics determined by [facility]. It has not been cleared or approved by the Food and Drug Administration.   Marland Kitchen REFLEX: 10/18/2020 Comment   Final  Comment: (NOTE) Reflex to CALR Mutation Analysis, JAK2 Exon 12-15 Mutation Analysis, and MPL Mutation Analysis is indicated.   Marland Kitchen Extraction 10/18/2020 Completed   Corrected   Comment: (NOTE) Performed At: [employer] [facility] [address] [place], [place] [id number] Katina Degree MDPhD [id number] Performed At: [facility] [place], [place] [id number] Katina Degree MDPhD [id number]:[id number]   . PTH 10/18/2020 406* 15 - 65 pg/mL Final   Comment: (NOTE) Performed At: [facility] [place], [place] [id number] Rush Farmer MD [id number]   . CALR Mutation Detection Result 10/18/2020 Comment   Final   Comment: (NOTE) NEGATIVE No insertions or deletions were detected within the analyzed region of the calreticulin (CALR) gene. A negative result does not entirely exclude the possibility of a clonal population carrying CALR gene mutations that are not covered by this assay. Results should be interpreted in conjunction with clinical and laboratory findings for the most accurate interpretation.   . Background: 10/18/2020 Comment   Final   Comment: (NOTE) The calcium-binding endoplasmic reticulin chaperone protein, calreticulin (CALR), is somatically  mutated in approximately 70% of patients with JAK2-negative essential thrombocythemia (ET) and 60- 88% of patients with JAK2-negative primary myelofibrosis(PMF). Only a minority of patients (approximately 8%) with myelodysplasia have mutations in  CALR gene. CALR mutations are rarely detected in patients with de novo acute myeloid leukemia, chronic myelogenous leukemia, lymphoid leukemia, or solid tumors. CALR mutations are not detected in polycythemia and generally appear to be mutually exclusive with JAK2 mutations and MPL mutations. The majority of mutational changes involve a variety of insertion or deletion mutations in exon 9 of the calreticulin gene: approximately 53% of all CALR mutations are a 52 bp deletion (type-1) while the second most prevalent mutation (approximately 32%) contains a 5 bp insertion (type-2). Other mutations (non-type 1 or type 2) are seen                           in a small minority of cases. CALR mutations in PMF tend to be associated with a favorable prognosis compared to JAK2 V617F mutations, whereas primary myelofibrosis negative for CALR, JAK2 V617F and MPL mutations (so-called triple negative) is associated with a poor prognosis and shorter survival. The detection of a CALR gene mutation aids in the specific diagnosis of a myeloproliferative neoplasm, and help distinguish this clonal disease from a benign reactive process.   . Methodology: 10/18/2020 Comment   Final   Comment: (NOTE) Genomic DNA was isolated from the provided specimen. Polymerase chain reaction (PCR) of exon 9 of the CALR gene was performed with specific fluorescent-labeled primers, and the PCR product was analyzed by capillary gel electrophoresis to determine the size of the PCR products. This PCR assay is capable of detecting a mutant cell population with a sensitivity of 5 mutant cells per 100 normal cells. A negative result does not exclude the presence of a myeloproliferative  disorder or other neoplastic process. This test was developed and its performance characteristics determined by [facility]. It has not been cleared or approved by the Food and Drug Administration. The FDA has determined that such clearance or approval is not necessary.   . References: 10/18/2020 Comment   Final   Comment: (NOTE) 1. Klampfel, T. et al. (2013) Somatic mutations of calreticulin in   myeloproliferative neoplasms. New Engl. J. Med. 734:1937-9024. 2. Haynes Kerns et al. (2013) Somatic CALR mutations in   myeloproliferative neoplasms with  nonmutated JAK2. New Engl. J.   Med. [phone].   Marland Kitchen Director Review 10/18/2020 Comment   Final   Comment: (NOTE) Marc Morgans, PhD, [facility] Director, [place] for [place] and Pathology Research [place],  [phone]   . JAK2 Exons 12-15 Mut Det PCR: 10/18/2020 Comment   Final   Comment: (NOTE) NEGATIVE JAK2 mutations were not detected in exons 12, 13, 14 and 15. This result does not rule out the presence of JAK2 mutation at a level below the detection sensitivity of this assay, the presence of other mutations outside the analyzed region of the JAK2 gene, or the presence of a myeloproliferative or other neoplasm. Result must be correlated with other clinical data for the most accurate diagnosis.   Marland Kitchen BACKGROUND: 10/18/2020 Comment   Final   Comment: (NOTE) JAK2 V617F mutation is detected in patients with polycythemia vera (95%), essential thrombocythemia (50%) and primary myelofibrosis (50%). A small percentage of JAK2 mutation positive patients (3.3%) contain other non-V617F mutations within exons 12 to 15. The detection of a JAK2 gene mutation aids in the specific diagnosis of a myeloproliferative neoplasm, and help distinguish this clonal disease from a benign reactive process.   . Method 10/18/2020 Comment   Final   Comment: (NOTE) Total RNA was purified from the provided  specimen. The JAK2 gene region covering exons 12 to 15 was subjected to reverse- transcription coupled PCR amplification, and bi-directional sequencing to identify sequence variations. This assay has a sensitivity to detect approximately 15% population of cells containing the JAK2 mutations in a background of non-mutant cells. This test was developed and its performance characteristics determined by [facility]. It has not been cleared or approved by the Food and Drug Administration.   . References 10/18/2020 Comment   Final   Comment: (NOTE) Algasham, N. et al. Detection of mutations in JAK2 exons 12-15 by Sanger sequencing. Int J Lab Hemato. 2015, 38:34-41. Joelene Millin al. Mutation profile of JAK2 transcripts in patients with chronic myeloproliferative neoplasias. J Mol Diagn. 2009, 11:49-53.   Marland Kitchen DIRECTOR REVIEW: 10/18/2020 Comment   Final   Comment: (NOTE) Kirby Crigler, PhD, [place] Associate Technical Director, [place] for Molecular Biology / Pathology   [facility]) [place], [place],   [place], [place]   [phone]   . MPL MUTATION ANALYSIS RESULT: 10/18/2020 Comment   Final   Comment: (NOTE) No MPL mutation was identified in the provided specimen of this individual. Results should be interpreted in conjunction with clinical and other laboratory findings for the most accurate interpretation.   Marland Kitchen BACKGROUND: 10/18/2020 Comment   Final   Comment: (NOTE) MPL (myeloproliferative leukemia virus oncogene homology) belongs to the hematopoietin superfamily and enables its ligand thrombopoietin to facilitate both global hematopoiesis and megakaryocyte growth and differentiation. MPL W515 mutations are present in patients with primary myelofibrosis (PMF) and essential thrombocythemia (ET) at a frequency of approximately 5% and 1% respectively. The S505 mutation is  detected in patients with hereditary thrombocythemia.   Marland Kitchen METHODOLOGY: 10/18/2020 Comment   Final   Comment: (NOTE) Genomic DNA was purified from the provided specimen. MPL gene region covering the S505N and W515L/K mutations were subjected to PCR amplification and bi-directional sequencing in duplicate to identify sequence variations. This assay has a sensitivity to detect approximately 20-25% population of cells containing the MPL mutations in a background of non-mutant cells. This assay will not detect  the mutation below the sensitivity of this assay. Molecular- based testing is highly accurate, but as in any laboratory test, rare diagnostic errors may occur.   Marland Kitchen REFERENCES: 10/18/2020 Comment   Final   Comment: (NOTE) 1. Pardanani AD, et al. (2006). MPL515 mutations in   myeloproliferative and other myeloid disorders: a study   of 1182 patients. Blood 606:3016-0109. 2. Andre Lefort and Levine RL. (2008). JAK2 and MPL   mutations in myeloproliferative neoplasms: discovery and   science. Leukemia 22:1813-1817. 3. Juline Patch, et al. (2009). Evidence for a founder effect   of the MPL-S505N mutation in eight [place] pedigrees with   hereditary thrombocythemia. Haematologica 94(10):1368-   3235.   Marland Kitchen DIRECTOR REVIEW: 10/18/2020 Comment   Final   Comment: (NOTE) Kirby Crigler, PhD, [place] Associate Technical Director, [place] for Molecular Biology / Pathology   [facility]) [place], [place],   [place], [place]   [phone] This test was developed and its performance characteristics determined by [facility]. It has not been cleared or approved by the Food and Drug Administration.   . Extraction 10/18/2020 Comment   Final   Comment: (NOTE) This sample has been received and DNA extraction has been performed. Performed At: [facility] [address] [place],  [place] [id number] Katina Degree MDPhD [id number] Performed At: [facility] [place], [place] [id number] Katina Degree MDPhD [id number]:[id number]     Assessment:  Connie Olsen is a 75 y.o. female with a history of stage II gallbladder cancer, left breast DCIS, and ESRD on dialysis.   She has a history of gallbladder cancer s/p laparoscopic cholecystectomy and liver wedge biopsy by Dr Mariah Milling at [facility] on 04/15/2014.  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  Tumor invaded perimuscular connective tissue with no extension beyond the serosa or into the liver.  Margins were uninvolved (closest 0.5 mm).  There is no lymphovascular or perineural invasion.  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  One lymph node was negative.  Pathologic stage was pT2 pN0.   Abdomen and pelvis CT on 12/30/2018 showed no definite metastatic disease within the chest, abdomen, or pelvis. There was minimally increased conspicuity of a hypodense left hepatic lesion, favor benign in etiology such as focal fat given relative stability since 2017. Attention on follow up imaging was recommended.  There was no significant change in the mildy prominent aortocaval lymph node.   Chest abdomen and pelvis CT with contrast and MIPS on 06/30/2019 revealed no evidence of metastatic disease in the chest, abdomen, or pelvis. There was similar appearance of low-attenuation in the left hepatic lobe favoring focal fatty infiltration. The aortocaval lymph node was less conspicuous on this study.  Chest, abdomen, and pelvis CT on 06/07/2020 revealed no metastatic disease.   CA19-9 has been followed: 87 on 10/15/2014, 165 on 01/21/2015, 92 on 03/22/2015, 131 on 09/20/2015, 168 on 03/20/2016, 89 on 09/18/2016, 143 on 09/24/2017, 495 on 09/16/2018, 140 on 12/30/2018, and 167 on 06/07/2020.  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  DCIS was ER and PR positive.  She declined further surgery or radiation.  She remains on Armidex.  She is followed by Dr. Deitra Mayo at [facility].  Diagnostic bilateral mammogram on 12/31/2019 revealed no  evidence of malignancy.  She is finishing her last bottle of Arimidex next month.  She is on dialysis 3 times a week.   She has had recurrent episodes of AV-fistula/graft thrombosis.  She has been seen in the hematology clinic by Dr. Dyann Kief.   Thrombophilia work-up was not recommended.  Anticardiolipin antibody IgM was > 150 on 11/28/2016 and 06/06/2017.  Beta-2 glycoprotein was > 150 on 11/28/2016 and 06/06/2017.  Lupus anticoagulant panel was + on 12/24/2016 and 06/06/2017.   Bone density on 11/11/2017 revealed osteopenia with a T score of -1.40 at the right neck femur and osteoporosis with a T-score of -3.3 in the left radius. Bone density on 03/22/2020 revealed osteoporosis with a T score of -2.8 at the right total femur. She is not a candidate for Zometa given her ESRD.  She has IBS.  Last colonoscopy was on 05/08/2016.  She has Barrett's esophagus. Last EGD was on 05/23/2017.  Echo on 03/27/2019 revealed ejection fraction > 55%.   She has erythrocytosis.  The dialysis center has been  "dumping" her blood for the past few weeks due to her high hemoglobin.  Per report, she has not had an ESA in a year. She takes Auryxia (Ferric Citrate Tablets).  Work-up on 09/06/2020 revealed a hematocrit of 46.2, hemoglobin 14.8, platelets 242,000, WBC 7,700 ([place] 6200).  Ferritin was 419 with an iron saturation of 29% and a TIBC of 196. Carbon monoxide was 4.3% (0-3.6%). Erythropoietin level was 49.5 (2.6 - 18.5).  The patient has received both doses of the COVID-19 vaccine.  Symptomatically, she has been fine. Her leg is healing. Her arthritis is stable.  At dialysis, she has not needed to have her blood dumped for several weeks. She is unaware of  adrenal insufficiency.  Plan: 1.   Erythrocytosis  Hematocrit 50.3. Hemoglobin 16.4 and MCV 93.0 on 06/07/2020.  Hematocrit 46.2. Hemoglobin 14.8 and MCV 90.9 on 09/06/2020.  Hematocrit 48.8. Hemoglobin 15.5 and MCV 89.2 on 10/18/2020.  She is on dialysis.  She has not been on an ESA in a year per report.  She had been undergoing blood "wasting" every 4 dialysis sessions.   She has had no blood dumped over the past several weeks.  Erythropoietin level is elevated (49.5).   Differential for an elevated epo level includes HCC, renal cell carcinoma, hemangioblastoma, pheochromocytoma, uterine leiomyomata.   JAK2 V617F , exon 12-15, CALR, and MPL were negative on 10/18/2020.    AFP was 1.4 on 10/18/2020.   Consider 24 hour urine for catecholamines and metanephrines.  Anticipate follow-up imaging.     Patient's last chest, abdomen pelvis CT scan was on 06/07/2020. 3.   Stage II gallbladder cancer   Symptomatically, she remains at her baseline.  Chest, abdomen, and pelvis CT with IV contrast at [facility] on 06/07/2020 revealed no evidence of recurrent disease.  CA19-9 was 167 on 06/07/2020 (fluctuates).  Continue to monitor peripherally. 4.   Left breast DCIS  She has a h/o + margins.  She declined radiation.  Bilateral diagnostic mammogram on 12/31/2019 revealed no recurrence.   She has been on Arimidex x 5 years.   She plans to finish her last bottle of Arimidex in 10/2020.  She is followed by Dr. Deitra Mayo at [facility]. 5.   Recurrent AV-fistula/graft thrombosis  Patient with + lupus anticoagulant, anti-cardiolipin antibodies, beta-2 glycoprotein antibodies.  She had a AV fistula revision on 06/24/2020.  Is on aspirin.  Follow-up with Dr. Dyann Kief. 6.   Elevated ferritin  Ferritin was 419 with an iron saturation of 29% and a TIBC of 196 on 09/06/2020.  Patient notes phosphate binder contains iron. 7.   Port-a cath maintenance  She continues port maintenance in clinic. 8.   Port flush  every 6-12 weeks. 9.   MD to contact nephrology. 10.   RN: Contact nephrology re: blood pressure 11.   RTC in 3 months for MD assessment and +/- labs (when patient comes for port flush).  I discussed the assessment and treatment plan with the patient.  The patient was provided an opportunity to ask questions and all were answered.  The patient agreed with the plan and demonstrated an understanding of the instructions.  The patient was advised to call back if the symptoms worsen or if the condition fails to improve as anticipated.  I provided 18 minutes of face-to-face time during this this encounter and > 50% was spent counseling as documented under my assessment and plan.  An additional 5 minutes were spent reviewing her chart (Epic and Care Everywhere) including notes, labs, and imaging studies.    Melissa C. Mike Gip, MD, PhD    11/22/2020, 1:46 PM  I, Mirian Mo Tufford, am acting as Education administrator for [employer]. Mike Gip, MD, PhD.  I, Melissa C. Mike Gip, MD, have reviewed the above documentation for accuracy and completeness, and I agree with the above.

## 2020-11-22 ENCOUNTER — Telehealth: Payer: Self-pay | Admitting: *Deleted

## 2020-11-22 ENCOUNTER — Inpatient Hospital Stay: Payer: Medicare Other | Attending: Hematology and Oncology

## 2020-11-22 ENCOUNTER — Other Ambulatory Visit: Payer: Self-pay

## 2020-11-22 ENCOUNTER — Inpatient Hospital Stay (HOSPITAL_BASED_OUTPATIENT_CLINIC_OR_DEPARTMENT_OTHER): Payer: Medicare Other | Admitting: Hematology and Oncology

## 2020-11-22 ENCOUNTER — Inpatient Hospital Stay: Payer: Medicare Other

## 2020-11-22 VITALS — BP 62/40 | HR 65 | Temp 97.6°F | Resp 18 | Wt 171.0 lb

## 2020-11-22 DIAGNOSIS — Z8509 Personal history of malignant neoplasm of other digestive organs: Secondary | ICD-10-CM | POA: Insufficient documentation

## 2020-11-22 DIAGNOSIS — D751 Secondary polycythemia: Secondary | ICD-10-CM | POA: Diagnosis not present

## 2020-11-22 DIAGNOSIS — D0512 Intraductal carcinoma in situ of left breast: Secondary | ICD-10-CM | POA: Insufficient documentation

## 2020-11-22 DIAGNOSIS — C23 Malignant neoplasm of gallbladder: Secondary | ICD-10-CM | POA: Diagnosis not present

## 2020-11-22 DIAGNOSIS — R7989 Other specified abnormal findings of blood chemistry: Secondary | ICD-10-CM | POA: Diagnosis not present

## 2020-11-22 DIAGNOSIS — Z95828 Presence of other vascular implants and grafts: Secondary | ICD-10-CM

## 2020-11-22 DIAGNOSIS — Z452 Encounter for adjustment and management of vascular access device: Secondary | ICD-10-CM | POA: Diagnosis present

## 2020-11-22 MED ORDER — HEPARIN SOD (PORK) LOCK FLUSH 100 UNIT/ML IV SOLN
INTRAVENOUS | Status: AC
  Filled 2020-11-22: qty 5

## 2020-11-22 MED ORDER — HEPARIN SOD (PORK) LOCK FLUSH 100 UNIT/ML IV SOLN
500.0000 [IU] | Freq: Once | INTRAVENOUS | Status: AC
  Administered 2020-11-22: 500 [IU] via INTRAVENOUS
  Filled 2020-11-22: qty 5

## 2020-11-22 MED ORDER — SODIUM CHLORIDE 0.9% FLUSH
10.0000 mL | INTRAVENOUS | Status: AC | PRN
  Administered 2020-11-22: 10 mL via INTRAVENOUS
  Filled 2020-11-22: qty 10

## 2020-11-22 MED ORDER — ALTEPLASE 2 MG IJ SOLR
2.0000 mg | Freq: Once | INTRAMUSCULAR | Status: AC
  Administered 2020-11-22: 2 mg
  Filled 2020-11-22: qty 2

## 2020-11-22 NOTE — Progress Notes (Signed)
Patient here for port flush today, no labs. Port flushes well with little resistance however no blood return. 2mg  cathflo given, waited 30 min still no blood return. 2nd nurse assessed no success. MD notified, order for Chest x-ray, patient aware to walk in for chest x-ray. Patient discharged, stable

## 2020-11-22 NOTE — Telephone Encounter (Signed)
Called to ask if pt b/p usually low. Today in clinic it was 62/40 and she does not feel good. Nurse kate says that she is usually  low and  They take it on her ankle. She also gets midodrine in dialysis and she I suppose to take it at home. Then Mike Gip came out of the room and she is suppose to take the med this afternoon

## 2020-11-22 NOTE — Progress Notes (Signed)
Loose bowels, IBS flare.  Sitting BP 62/40 HR 65 Standing BP 79/56 HR 79

## 2020-11-24 ENCOUNTER — Inpatient Hospital Stay: Payer: Medicare Other

## 2020-12-06 ENCOUNTER — Ambulatory Visit
Admission: RE | Admit: 2020-12-06 | Discharge: 2020-12-06 | Disposition: A | Discharge status: Home / Self Care | Payer: Medicare Other | Attending: Hematology and Oncology | Admitting: Hematology and Oncology

## 2020-12-06 ENCOUNTER — Other Ambulatory Visit: Payer: Self-pay

## 2020-12-06 ENCOUNTER — Ambulatory Visit
Admission: RE | Admit: 2020-12-06 | Discharge: 2020-12-06 | Disposition: A | Discharge status: Home / Self Care | Payer: Medicare Other | Source: Ambulatory Visit | Attending: Hematology and Oncology | Admitting: Hematology and Oncology

## 2020-12-06 DIAGNOSIS — D0512 Intraductal carcinoma in situ of left breast: Secondary | ICD-10-CM

## 2020-12-29 ENCOUNTER — Inpatient Hospital Stay: Payer: Medicare Other | Attending: Oncology

## 2020-12-29 ENCOUNTER — Other Ambulatory Visit: Payer: Self-pay

## 2020-12-29 ENCOUNTER — Telehealth: Payer: Self-pay

## 2020-12-29 DIAGNOSIS — Z8509 Personal history of malignant neoplasm of other digestive organs: Secondary | ICD-10-CM | POA: Diagnosis present

## 2020-12-29 DIAGNOSIS — Z86000 Personal history of in-situ neoplasm of breast: Secondary | ICD-10-CM | POA: Insufficient documentation

## 2020-12-29 DIAGNOSIS — Z452 Encounter for adjustment and management of vascular access device: Secondary | ICD-10-CM | POA: Diagnosis present

## 2020-12-29 DIAGNOSIS — Z95828 Presence of other vascular implants and grafts: Secondary | ICD-10-CM

## 2020-12-29 DIAGNOSIS — C23 Malignant neoplasm of gallbladder: Secondary | ICD-10-CM

## 2020-12-29 DIAGNOSIS — D0512 Intraductal carcinoma in situ of left breast: Secondary | ICD-10-CM

## 2020-12-29 MED ORDER — SODIUM CHLORIDE 0.9% FLUSH
10.0000 mL | Freq: Once | INTRAVENOUS | Status: AC
  Administered 2020-12-29: 10 mL via INTRAVENOUS
  Filled 2020-12-29: qty 10

## 2020-12-29 MED ORDER — HEPARIN SOD (PORK) LOCK FLUSH 100 UNIT/ML IV SOLN
500.0000 [IU] | Freq: Once | INTRAVENOUS | Status: AC
  Administered 2020-12-29: 500 [IU] via INTRAVENOUS
  Filled 2020-12-29: qty 5

## 2020-12-29 NOTE — Telephone Encounter (Signed)
Patient is here for port flush. No blood return again. Port has not worked well for months. x-ray done last visit. Can we hook her up with [facility] for possible replacement of port. [facility] put it in and she does not know MD that did it. Looking for any help. Thanks   - Referral placed to [facility]

## 2021-01-03 ENCOUNTER — Inpatient Hospital Stay: Payer: Medicare Other

## 2021-01-03 ENCOUNTER — Telehealth (INDEPENDENT_AMBULATORY_CARE_PROVIDER_SITE_OTHER): Payer: Self-pay

## 2021-01-03 NOTE — Telephone Encounter (Signed)
I attempted to contact the patient and a message was left for a return call. 

## 2021-01-05 ENCOUNTER — Inpatient Hospital Stay: Payer: Medicare Other

## 2021-01-17 ENCOUNTER — Inpatient Hospital Stay: Payer: Medicare Other

## 2021-01-24 ENCOUNTER — Encounter (INDEPENDENT_AMBULATORY_CARE_PROVIDER_SITE_OTHER): Payer: Medicare Other | Admitting: Vascular Surgery

## 2021-02-07 ENCOUNTER — Encounter (INDEPENDENT_AMBULATORY_CARE_PROVIDER_SITE_OTHER): Payer: Medicare Other | Admitting: Vascular Surgery

## 2021-02-08 ENCOUNTER — Encounter (INDEPENDENT_AMBULATORY_CARE_PROVIDER_SITE_OTHER): Payer: Self-pay | Admitting: Vascular Surgery

## 2021-02-14 ENCOUNTER — Other Ambulatory Visit: Payer: Self-pay

## 2021-02-14 ENCOUNTER — Inpatient Hospital Stay: Payer: Medicare Other | Attending: Oncology

## 2021-02-14 ENCOUNTER — Inpatient Hospital Stay: Payer: Medicare Other

## 2021-02-14 DIAGNOSIS — Z452 Encounter for adjustment and management of vascular access device: Secondary | ICD-10-CM | POA: Diagnosis present

## 2021-02-14 DIAGNOSIS — Z86000 Personal history of in-situ neoplasm of breast: Secondary | ICD-10-CM | POA: Insufficient documentation

## 2021-02-14 DIAGNOSIS — Z8509 Personal history of malignant neoplasm of other digestive organs: Secondary | ICD-10-CM | POA: Insufficient documentation

## 2021-02-14 DIAGNOSIS — Z95828 Presence of other vascular implants and grafts: Secondary | ICD-10-CM

## 2021-02-14 MED ORDER — HEPARIN SOD (PORK) LOCK FLUSH 100 UNIT/ML IV SOLN
500.0000 [IU] | Freq: Once | INTRAVENOUS | Status: AC
  Administered 2021-02-14: 500 [IU] via INTRAVENOUS
  Filled 2021-02-14: qty 5

## 2021-02-14 MED ORDER — SODIUM CHLORIDE 0.9% FLUSH
10.0000 mL | Freq: Once | INTRAVENOUS | Status: AC
  Administered 2021-02-14: 10 mL via INTRAVENOUS
  Filled 2021-02-14: qty 10

## 2021-02-16 ENCOUNTER — Inpatient Hospital Stay: Payer: Medicare Other

## 2021-02-28 ENCOUNTER — Other Ambulatory Visit: Payer: Medicare Other

## 2021-02-28 ENCOUNTER — Ambulatory Visit: Payer: Medicare Other | Admitting: Oncology

## 2021-03-28 ENCOUNTER — Inpatient Hospital Stay: Payer: Medicare Other | Attending: Hematology and Oncology

## 2021-03-28 ENCOUNTER — Other Ambulatory Visit: Payer: Self-pay | Admitting: *Deleted

## 2021-03-28 ENCOUNTER — Other Ambulatory Visit: Payer: Self-pay

## 2021-03-28 ENCOUNTER — Inpatient Hospital Stay (HOSPITAL_BASED_OUTPATIENT_CLINIC_OR_DEPARTMENT_OTHER): Payer: Medicare Other | Admitting: Internal Medicine

## 2021-03-28 ENCOUNTER — Encounter: Payer: Self-pay | Admitting: Internal Medicine

## 2021-03-28 DIAGNOSIS — D0512 Intraductal carcinoma in situ of left breast: Secondary | ICD-10-CM | POA: Insufficient documentation

## 2021-03-28 DIAGNOSIS — Z803 Family history of malignant neoplasm of breast: Secondary | ICD-10-CM | POA: Diagnosis not present

## 2021-03-28 DIAGNOSIS — Z79899 Other long term (current) drug therapy: Secondary | ICD-10-CM | POA: Diagnosis not present

## 2021-03-28 DIAGNOSIS — Z8509 Personal history of malignant neoplasm of other digestive organs: Secondary | ICD-10-CM | POA: Diagnosis present

## 2021-03-28 DIAGNOSIS — Z79811 Long term (current) use of aromatase inhibitors: Secondary | ICD-10-CM | POA: Diagnosis not present

## 2021-03-28 DIAGNOSIS — Z7982 Long term (current) use of aspirin: Secondary | ICD-10-CM | POA: Diagnosis not present

## 2021-03-28 DIAGNOSIS — Z992 Dependence on renal dialysis: Secondary | ICD-10-CM | POA: Diagnosis not present

## 2021-03-28 DIAGNOSIS — N186 End stage renal disease: Secondary | ICD-10-CM | POA: Insufficient documentation

## 2021-03-28 DIAGNOSIS — D751 Secondary polycythemia: Secondary | ICD-10-CM

## 2021-03-28 DIAGNOSIS — Z95828 Presence of other vascular implants and grafts: Secondary | ICD-10-CM

## 2021-03-28 DIAGNOSIS — M8589 Other specified disorders of bone density and structure, multiple sites: Secondary | ICD-10-CM | POA: Insufficient documentation

## 2021-03-28 DIAGNOSIS — C23 Malignant neoplasm of gallbladder: Secondary | ICD-10-CM

## 2021-03-28 DIAGNOSIS — Z452 Encounter for adjustment and management of vascular access device: Secondary | ICD-10-CM | POA: Insufficient documentation

## 2021-03-28 DIAGNOSIS — D6859 Other primary thrombophilia: Secondary | ICD-10-CM | POA: Diagnosis not present

## 2021-03-28 DIAGNOSIS — R7989 Other specified abnormal findings of blood chemistry: Secondary | ICD-10-CM

## 2021-03-28 MED ORDER — HEPARIN SOD (PORK) LOCK FLUSH 100 UNIT/ML IV SOLN
500.0000 [IU] | Freq: Once | INTRAVENOUS | Status: AC
  Administered 2021-03-28: 500 [IU] via INTRAVENOUS
  Filled 2021-03-28: qty 5

## 2021-03-28 MED ORDER — SODIUM CHLORIDE 0.9% FLUSH
10.0000 mL | Freq: Once | INTRAVENOUS | Status: AC
  Administered 2021-03-28: 10 mL via INTRAVENOUS
  Filled 2021-03-28: qty 10

## 2021-03-28 NOTE — Assessment & Plan Note (Addendum)
#  Port malfunction/port flush: Recommend reevaluation with Dr. Lucky Cowboy regarding port placement.  Discussed that patient could have port flushes at [facility]; [place] clinic every 6 weeks-provided she sees at least a physician/NP-once every 12 months.  # gall bladder ca stage II.  S/p surgery- follows with Dr.Zani; October 2021 CT scan negative for any recurrent malignancy.  Follow-up with [facility].  # DCIS- follows up with Dr.Westbrook, [facility].    Stable.  # ESRD- on permacath [M/W/F]- Dr.Swain; [place]; multiple gaft/fistula failure- see discussion above.  We will be happy to coordinate lab draws at the follow-up appointments/port flush if needed.  #Low blood pressures-I suspect artifactual given dialysis/patient asymptomatic.    # DISPOSITION: # port flush today # port flush every 6 weeks/fridays #  follow up in 12 months- NP/friday-[NO labs]-Dr.B Cc; Westbrook;Swain; Dr.Metjian; hematology.

## 2021-03-28 NOTE — Assessment & Plan Note (Signed)
Assessment:  Connie Olsen is a 75 y.o. female with a history of stage II gallbladder cancer, left breast DCIS, and ESRD on dialysis.  She has a history of gallbladder cancer s/p laparoscopic cholecystectomy and liver wedge biopsy by Dr Mariah Milling at [facility] on 04/15/2014.  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  Tumor invaded perimuscular connective tissue with no extension beyond the serosa or into the liver.  Margins were uninvolved (closest 0.5 mm).  There is no lymphovascular or perineural invasion.  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  One lymph node was negative.  Pathologic stage was pT2 pN0.  Abdomen and pelvis CT on 12/30/2018 showed no definite metastatic disease within the chest, abdomen, or pelvis. There was minimally increased conspicuity of a hypodense left hepatic lesion, favor benign in etiology such as focal fat given relative stability since 2017. Attention on follow up imaging was recommended.  There was no significant change in the mildy prominent aortocaval lymph node.  Chest abdomen and pelvis CT with contrast and MIPS on 06/30/2019 revealed no evidence of metastatic disease in the chest, abdomen, or pelvis. There was similar appearance of low-attenuation in the left hepatic lobe favoring focal fatty infiltration. The aortocaval lymph node was less conspicuous on this study.  Chest, abdomen, and pelvis CT on 06/07/2020 revealed no metastatic disease.  CA19-9 has been followed: 87 on 10/15/2014, 165 on 01/21/2015, 92 on 03/22/2015, 131 on 09/20/2015, 168 on 03/20/2016, 89 on 09/18/2016, 143 on 09/24/2017, 495 on 09/16/2018, 140 on 12/30/2018, and 167 on 06/07/2020.  She has a history of DCIS left breast (11/2014) s/p multiple excisions with + margins.  DCIS was ER and PR positive.  She declined further surgery or radiation.  She remains on Armidex.  She is  followed by Dr. Deitra Mayo at [facility].  Diagnostic bilateral mammogram on 12/31/2019 revealed no evidence of malignancy.  She is finishing her last bottle of Arimidex next month.  She is on dialysis 3 times a week.  She has had recurrent episodes of AV-fistula/graft thrombosis.  She has been seen in the hematology clinic by Dr. Dyann Kief.   Thrombophilia work-up was not recommended.  Anticardiolipin antibody IgM was > 150 on 11/28/2016 and 06/06/2017.  Beta-2 glycoprotein was > 150 on 11/28/2016 and 06/06/2017.  Lupus anticoagulant panel was + on 12/24/2016 and 06/06/2017.  Bone density on 11/11/2017 revealed osteopenia with a T score of -1.40 at the right neck femur and osteoporosis with a T-score of -3.3 in the left radius. Bone density on 03/22/2020 revealed osteoporosis with a T score of -2.8 at the right total femur. She is not a candidate for Zometa given her ESRD.  She has IBS.  Last colonoscopy was on 05/08/2016.  She has Barrett's esophagus. Last EGD was on 05/23/2017.  Echo on 03/27/2019 revealed ejection fraction > 55%.  She has erythrocytosis.  The dialysis center has been  "dumping" her blood for the past few weeks due to her high hemoglobin.  Per report, she has not had an ESA in a year. She takes Auryxia (Ferric Citrate Tablets).  Work-up on 09/06/2020 revealed a hematocrit of 46.2, hemoglobin 14.8, platelets 242,000, WBC 7,700 ([place] 6200).  Ferritin was 419 with an iron saturation of 29% and a TIBC of 196. Carbon monoxide was 4.3% (0-3.6%). Erythropoietin level was 49.5 (2.6 - 18.5).  The patient has received both doses of the COVID-19 vaccine.  Symptomatically, she  has been fine. Her leg is healing. Her arthritis is stable.  At dialysis, she has not needed to have her blood dumped for several weeks. She is unaware of adrenal insufficiency.  Plan: 1.   Erythrocytosis             Hematocrit 50.3. Hemoglobin 16.4 and MCV 93.0 on 06/07/2020.             Hematocrit 46.2.  Hemoglobin 14.8 and MCV 90.9 on 09/06/2020.             Hematocrit 48.8. Hemoglobin 15.5 and MCV 89.2 on 10/18/2020.             She is on dialysis.  She has not been on an ESA in a year per report.             She had been undergoing blood "wasting" every 4 dialysis sessions.                         She has had no blood dumped over the past several weeks.             Erythropoietin level is elevated (49.5).                         Differential for an elevated epo level includes HCC, renal cell carcinoma, hemangioblastoma, pheochromocytoma, uterine leiomyomata.                         JAK2 V617F , exon 12-15, CALR, and MPL were negative on 10/18/2020.                          AFP was 1.4 on 10/18/2020.                         Consider 24 hour urine for catecholamines and metanephrines.             Anticipate follow-up imaging.                           Patient's last chest, abdomen pelvis CT scan was on 06/07/2020. 3.   Stage II gallbladder cancer              Symptomatically, she remains at her baseline.             Chest, abdomen, and pelvis CT with IV contrast at [facility] on 06/07/2020 revealed no evidence of recurrent disease.             CA19-9 was 167 on 06/07/2020 (fluctuates).             Continue to monitor peripherally. 4.   Left breast DCIS             She has a h/o + margins.  She declined radiation.             Bilateral diagnostic mammogram on 12/31/2019 revealed no recurrence.             She has been on Arimidex x 5 years.                         She plans to finish her last bottle of Arimidex in 10/2020.             She is  followed by Dr. Deitra Mayo at [facility]. 5.   Recurrent AV-fistula/graft thrombosis             Patient with + lupus anticoagulant, anti-cardiolipin antibodies, beta-2 glycoprotein antibodies.             She had a AV fistula revision on 06/24/2020.             Is on aspirin.             Follow-up with Dr. Dyann Kief. 6.   Elevated ferritin              Ferritin was 419 with an iron saturation of 29% and a TIBC of 196 on 09/06/2020.             Patient notes phosphate binder contains iron. 7.   Port-a cath maintenance             She continues port maintenance in clinic. 8.   Port flush every 6-12 weeks. 9.   MD to contact nephrology. 10.   RN: Contact nephrology re: blood pressure 11.   RTC in 3 months for MD assessment and +/- labs (when patient comes for port flush).

## 2021-03-28 NOTE — Progress Notes (Signed)
[place] CONSULT NOTE  Patient Care Team: [place], [facility] as PCP - General Lequita Asal, MD as Referring Physician (Hematology and Oncology)  CHIEF COMPLAINTS/PURPOSE OF CONSULTATION: gall bladder cancer/DCIS/   #  Oncology History Overview Note  Laparoscopic cholecystectomy, 04/15/14; Radical cholecystectomy with portal LAD, segment 4B/5 partial hepatectomy, 05/27/14.  found to have a gallbladder mass on CT that was followed by an MRI that raised concern for gallbladder cancer as well as a bile duct stricture followed by EUS and ERCP that revealed no further pathology. She was taken to the OR 8/13 for a lap chole with the intent that should frozen pathology demonstrate concern for cancer, a more radical resection would be done. However, at the time of surgery, though frozen section was negative for cancer, final pathology demonstrated a T2N0 well differentiated adenocarcinoma, margins negative.  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 Cena Benton        HISTORY OF PRESENTING ILLNESS:  Connie Olsen 75 y.o.  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  Patient states her port has not been flushing recently [not drawing blood for a long time]; she has missed couple of appointments with vascular; Dr.Dew.  However interested in keeping up future appointments.  With regards to oncology care-follows up at [facility].    Review of Systems  Constitutional:  Positive for malaise/fatigue. Negative for chills, diaphoresis and fever.  HENT:  Negative  for nosebleeds and sore throat.   Eyes:  Negative for double vision.  Respiratory:  Negative for cough, hemoptysis, sputum production, shortness of breath and wheezing.   Cardiovascular:  Negative for chest pain, palpitations, orthopnea and leg swelling.  Gastrointestinal:  Negative for abdominal pain, blood in stool, constipation, diarrhea, heartburn, melena, nausea and vomiting.  Genitourinary:  Negative for dysuria, frequency and urgency.  Musculoskeletal:  Positive for back pain. Negative for joint pain.  Skin: Negative.  Negative for itching and rash.  Neurological:  Negative for dizziness, tingling, focal weakness, weakness and headaches.  Endo/Heme/Allergies:  Does not bruise/bleed easily.  Psychiatric/Behavioral:  Negative for depression. The patient is not nervous/anxious and does not have insomnia.     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   Endometrial thickening on ultra sound 04/19/2015   History of ulcerative colitis    History of uterine fibroid    Osteoporosis     SURGICAL HISTORY: Past Surgical History:  Procedure Laterality Date   CHOLECYSTECTOMY     colonoscopy  05/08/2016   LAPAROSCOPIC PARTIAL HEPATECTOMY     MASTECTOMY PARTIAL / LUMPECTOMY     UPPER GI ENDOSCOPY  05/08/2016    SOCIAL HISTORY: Social History   Socioeconomic History   Marital status: Legally Separated    Spouse name: Not on file   Number of children: Not on file   Years of education: Not on file   Highest education level: Not on file  Occupational  History   Not on file  Tobacco Use   Smoking status: Never   Smokeless tobacco: Never  Vaping Use   Vaping Use: Never used  Substance and Sexual Activity   Alcohol use: No    Alcohol/week: 0.0 standard drinks   Drug use: No   Sexual activity: Not on file  Other Topics  Concern   Not on file  Social History Narrative   Not on file   Social Determinants of Health   Financial Resource Strain: Not on file  Food Insecurity: Not on file  Transportation Needs: Not on file  Physical Activity: Not on file  Stress: Not on file  Social Connections: Not on file  Intimate Partner Violence: Not on file    FAMILY HISTORY: Family History  Problem Relation Age of Onset   Breast cancer Mother     ALLERGIES:  is allergic to aspirin, brassica napus seed oil, naltrexone, librax  [chlordiazepoxide-clidinium], povidone iodine, povidone-iodine, and tape.  MEDICATIONS:  Current Outpatient Medications  Medication Sig Dispense Refill   aspirin EC 81 MG tablet Take 81 mg by mouth daily.      B Complex-C-Folic Acid (DIALYVITE TABLET) TABS Take 1 tablet by mouth daily.      ferric citrate (AURYXIA) 1 GM 210 MG(Fe) tablet Take 420 mg by mouth 3 (three) times daily.     ibuprofen (ADVIL) 200 MG tablet Take 200 mg by mouth as needed.     lidocaine-prilocaine (EMLA) cream Apply to access before dialysis     midodrine (PROAMATINE) 10 MG tablet Take by mouth.     omeprazole (PRILOSEC) 20 MG capsule Take 20 mg by mouth daily.  8   pregabalin (LYRICA) 25 MG capsule Take 25 mg by mouth 3 (three) times daily.     SENSIPAR 60 MG tablet TAKE 1 TABLET BY MOUTH ONCE A DAY WITH MEALS  11   acetaminophen (TYLENOL) 500 MG tablet Take 500 mg by mouth every 6 (six) hours as needed for moderate pain.  (Patient not taking: No sig reported)     Bismuth Subsalicylate 536 UY/40HK SUSP Take 15 mLs by mouth daily. As directed as needed (Patient not taking: Reported on 03/28/2021)     Darbepoetin Alfa (ARANESP) 100 MCG/0.5ML SOSY injection Inject 100 mcg into the vein every 7 (seven) days.      diazepam (VALIUM) 10 MG tablet TAKE 1 TABLET BY MOUTH 1 HOUR PRIOR TO DENTAL TREATMENT (Patient not taking: No sig reported)     metoprolol tartrate (LOPRESSOR) 25 MG tablet 25 mg 2 (two) times daily.   (Patient not taking: Reported on 03/28/2021)  1   No current facility-administered medications for this visit.   Facility-Administered Medications Ordered in Other Visits  Medication Dose Route Frequency Provider Last Rate Last Admin   sodium chloride flush (NS) 0.9 % injection 10 mL  10 mL Intravenous PRN Nolon Stalls C, MD   10 mL at 11/22/20 1314      .  PHYSICAL EXAMINATION: ECOG PERFORMANCE STATUS: 0 - Asymptomatic  Vitals:   03/28/21 1330 03/28/21 1345  BP: (!) 41/29 (!) 74/55  Pulse: 70 82  Resp:  20  Temp:  98 F (36.7 C)  SpO2:  96%   There were no vitals filed for this visit.  Physical Exam Vitals and nursing note reviewed.  Constitutional:      Comments: Alone; ambulating with a rolling walker.      HENT:     Head: Normocephalic and atraumatic.  Mouth/Throat:     Pharynx: Oropharynx is clear.  Eyes:     Extraocular Movements: Extraocular movements intact.     Pupils: Pupils are equal, round, and reactive to light.  Cardiovascular:     Rate and Rhythm: Normal rate and regular rhythm.  Pulmonary:     Comments: Decreased breath sounds bilaterally.  Abdominal:     Palpations: Abdomen is soft.  Musculoskeletal:        General: Normal range of motion.     Cervical back: Normal range of motion.  Skin:    General: Skin is warm.  Neurological:     General: No focal deficit present.     Mental Status: She is alert and oriented to person, place, and time.  Psychiatric:        Behavior: Behavior normal.        Judgment: Judgment normal.     LABORATORY DATA:  I have reviewed the data as listed Lab Results  Component Value Date   WBC 5.9 10/18/2020   HGB 15.5 (H) 10/18/2020   HCT 48.8 (H) 10/18/2020   MCV 89.2 10/18/2020   PLT 282 10/18/2020   No results for input(s): NA, K, CL, CO2, GLUCOSE, BUN, CREATININE, CALCIUM, GFRNONAA, GFRAA, PROT, ALBUMIN, AST, ALT, ALKPHOS, BILITOT, BILIDIR, IBILI in the last 8760 hours.  RADIOGRAPHIC STUDIES: I  have personally reviewed the radiological images as listed and agreed with the findings in the report. No results found.  ASSESSMENT & PLAN:   Cancer of gallbladder ([place]) Assessment:  Connie Olsen is a 75 y.o. female with a history of stage II gallbladder cancer, left breast DCIS, and ESRD on dialysis.   She has a history of gallbladder cancer s/p laparoscopic cholecystectomy and liver wedge biopsy by Dr Mariah Milling at [facility] on 04/15/2014.  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  Tumor invaded perimuscular connective tissue with no extension beyond the serosa or into the liver.  Margins were uninvolved (closest 0.5 mm).  There is no lymphovascular or perineural invasion.  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  One lymph node was negative.  Pathologic stage was pT2 pN0.   Abdomen and pelvis CT on 12/30/2018 showed no definite metastatic disease within the chest, abdomen, or pelvis. There was minimally increased conspicuity of a hypodense left hepatic lesion, favor benign in etiology such as focal fat given relative stability since 2017. Attention on follow up imaging was recommended.  There was no significant change in the mildy prominent aortocaval lymph node.   Chest abdomen and pelvis CT with contrast and MIPS on 06/30/2019 revealed no evidence of metastatic disease in the chest, abdomen, or pelvis. There was similar appearance of low-attenuation in the left hepatic lobe favoring focal fatty infiltration. The aortocaval lymph node was less conspicuous on this study.  Chest, abdomen, and pelvis CT on 06/07/2020 revealed no metastatic disease.   CA19-9 has been followed: 87 on 10/15/2014, 165 on 01/21/2015, 92 on 03/22/2015, 131 on 09/20/2015, 168 on 03/20/2016, 89 on 09/18/2016, 143 on 09/24/2017, 495 on 09/16/2018, 140 on 12/30/2018, and 167 on 06/07/2020.   She has a history  of DCIS left breast (11/2014) s/p multiple excisions with + margins.  DCIS was ER and PR positive.  She declined further surgery or radiation.  She remains on Armidex.  She is followed by Dr. Deitra Mayo at [facility].  Diagnostic bilateral mammogram on 12/31/2019 revealed no evidence of  malignancy.  She is finishing her last bottle of Arimidex next month.   She is on dialysis 3 times a week.   She has had recurrent episodes of AV-fistula/graft thrombosis.  She has been seen in the hematology clinic by Dr. Dyann Kief.   Thrombophilia work-up was not recommended.  Anticardiolipin antibody IgM was > 150 on 11/28/2016 and 06/06/2017.  Beta-2 glycoprotein was > 150 on 11/28/2016 and 06/06/2017.  Lupus anticoagulant panel was + on 12/24/2016 and 06/06/2017.   Bone density on 11/11/2017 revealed osteopenia with a T score of -1.40 at the right neck femur and osteoporosis with a T-score of -3.3 in the left radius. Bone density on 03/22/2020 revealed osteoporosis with a T score of -2.8 at the right total femur. She is not a candidate for Zometa given her ESRD.   She has IBS.  Last colonoscopy was on 05/08/2016.  She has Barrett's esophagus. Last EGD was on 05/23/2017.  Echo on 03/27/2019 revealed ejection fraction > 55%.   She has erythrocytosis.  The dialysis center has been  "dumping" her blood for the past few weeks due to her high hemoglobin.  Per report, she has not had an ESA in a year. She takes Auryxia (Ferric Citrate Tablets).  Work-up on 09/06/2020 revealed a hematocrit of 46.2, hemoglobin 14.8, platelets 242,000, WBC 7,700 ([place] 6200).  Ferritin was 419 with an iron saturation of 29% and a TIBC of 196. Carbon monoxide was 4.3% (0-3.6%). Erythropoietin level was 49.5 (2.6 - 18.5).   The patient has received both doses of the COVID-19 vaccine.   Symptomatically, she has been fine. Her leg is healing. Her arthritis is stable.  At dialysis, she has not needed to have her blood dumped for several weeks. She is  unaware of adrenal insufficiency.   Plan: 1.   Erythrocytosis             Hematocrit 50.3. Hemoglobin 16.4 and MCV 93.0 on 06/07/2020.             Hematocrit 46.2. Hemoglobin 14.8 and MCV 90.9 on 09/06/2020.             Hematocrit 48.8. Hemoglobin 15.5 and MCV 89.2 on 10/18/2020.             She is on dialysis.  She has not been on an ESA in a year per report.             She had been undergoing blood "wasting" every 4 dialysis sessions.                         She has had no blood dumped over the past several weeks.             Erythropoietin level is elevated (49.5).                         Differential for an elevated epo level includes HCC, renal cell carcinoma, hemangioblastoma, pheochromocytoma, uterine leiomyomata.                         JAK2 V617F , exon 12-15, CALR, and MPL were negative on 10/18/2020.                          AFP was 1.4 on 10/18/2020.  Consider 24 hour urine for catecholamines and metanephrines.             Anticipate follow-up imaging.                           Patient's last chest, abdomen pelvis CT scan was on 06/07/2020. 3.   Stage II gallbladder cancer              Symptomatically, she remains at her baseline.             Chest, abdomen, and pelvis CT with IV contrast at [facility] on 06/07/2020 revealed no evidence of recurrent disease.             CA19-9 was 167 on 06/07/2020 (fluctuates).             Continue to monitor peripherally. 4.   Left breast DCIS             She has a h/o + margins.  She declined radiation.             Bilateral diagnostic mammogram on 12/31/2019 revealed no recurrence.             She has been on Arimidex x 5 years.                         She plans to finish her last bottle of Arimidex in 10/2020.             She is followed by Dr. Deitra Mayo at [facility]. 5.   Recurrent AV-fistula/graft thrombosis             Patient with + lupus anticoagulant, anti-cardiolipin antibodies, beta-2 glycoprotein antibodies.              She had a AV fistula revision on 06/24/2020.             Is on aspirin.             Follow-up with Dr. Dyann Kief. 6.   Elevated ferritin             Ferritin was 419 with an iron saturation of 29% and a TIBC of 196 on 09/06/2020.             Patient notes phosphate binder contains iron. 7.   Port-a cath maintenance             She continues port maintenance in clinic. 8.   Port flush every 6-12 weeks. 9.   MD to contact nephrology. 10.   RN: Contact nephrology re: blood pressure 11.   RTC in 3 months for MD assessment and +/- labs (when patient comes for port flush).  Primary hypercoagulable state ([place]) #Port malfunction/port flush: Recommend reevaluation with Dr. Lucky Cowboy regarding port placement.  Discussed that patient could have port flushes at [facility]; [place] clinic every 6 weeks-provided she sees at least a physician/NP-once every 12 months.   # gall bladder ca stage II.  S/p surgery- follows with Dr.Zani; October 2021 CT scan negative for any recurrent malignancy.  Follow-up with [facility].   # DCIS- follows up with Dr.Westbrook, [facility].    Stable.   # ESRD- on permacath [M/W/F]- Dr.Swain; [place]; multiple gaft/fistula failure- see discussion above.  We will be happy to coordinate lab draws at the follow-up appointments/port flush if needed.  #Low blood pressures-I suspect artifactual given dialysis/patient asymptomatic.    # DISPOSITION:  # port  flush today # port flush every 6 weeks/fridays #  follow up in 12 months- NP/friday-[NO labs]-Dr.B Cc; [place];Swain; Dr.Metjian; hematology.      All questions were answered. The patient knows to call the clinic with any problems, questions or concerns.       Cammie Sickle, MD 03/28/2021 2:38 PM

## 2021-03-28 NOTE — Progress Notes (Signed)
Pt refusing labs today due to poor venous access. She states that she gets her care at [facility] and all over her cancer care is at [facility]. She only comes to the cancer center for port maintenance. She states that the port does not function and does not give a blood return. She doesn't understand why she is seeing Dr. Rogue Bussing today.  Patient has multiple complaints.-frequent falls, weakness.

## 2021-03-30 ENCOUNTER — Inpatient Hospital Stay: Payer: Medicare Other

## 2021-03-31 ENCOUNTER — Ambulatory Visit: Payer: Medicare Other | Admitting: Nurse Practitioner

## 2021-04-27 ENCOUNTER — Encounter (INDEPENDENT_AMBULATORY_CARE_PROVIDER_SITE_OTHER): Payer: Self-pay | Admitting: Vascular Surgery

## 2021-04-27 ENCOUNTER — Ambulatory Visit (INDEPENDENT_AMBULATORY_CARE_PROVIDER_SITE_OTHER): Payer: Medicare Other | Admitting: Vascular Surgery

## 2021-04-27 ENCOUNTER — Other Ambulatory Visit: Payer: Self-pay

## 2021-04-27 ENCOUNTER — Encounter (INDEPENDENT_AMBULATORY_CARE_PROVIDER_SITE_OTHER): Payer: Self-pay

## 2021-04-27 ENCOUNTER — Telehealth (INDEPENDENT_AMBULATORY_CARE_PROVIDER_SITE_OTHER): Payer: Self-pay

## 2021-04-27 VITALS — Ht 59.0 in | Wt 168.0 lb

## 2021-04-27 DIAGNOSIS — Z95828 Presence of other vascular implants and grafts: Secondary | ICD-10-CM | POA: Diagnosis not present

## 2021-04-27 DIAGNOSIS — E782 Mixed hyperlipidemia: Secondary | ICD-10-CM

## 2021-04-27 DIAGNOSIS — C23 Malignant neoplasm of gallbladder: Secondary | ICD-10-CM | POA: Diagnosis not present

## 2021-04-27 DIAGNOSIS — D6859 Other primary thrombophilia: Secondary | ICD-10-CM

## 2021-04-27 DIAGNOSIS — N186 End stage renal disease: Secondary | ICD-10-CM | POA: Diagnosis not present

## 2021-04-27 NOTE — Progress Notes (Signed)
MRN : [id number]  Connie Olsen is a 75 y.o. ([birth year]) female who presents with chief complaint of No chief complaint on file. Marland Kitchen  History of Present Illness:   Chief complaint:  Patient presents with a nonfunctioning right IJ Infuse-a-Port associated with numerous medical problems.    history of gallbladder cancer s/p laparoscopic cholecystectomy and liver wedge biopsy by Dr Mariah Milling at [facility] on 04/15/2014.  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  Tumor invaded perimuscular connective tissue with no extension beyond the serosa or into the liver.  Margins were uninvolved (closest 0.5 mm).  There is no lymphovascular or perineural invasion.  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  One lymph node was negative.  Pathologic stage was pT2 pN0.  recurrent episodes of AV-fistula/graft thrombosis.  She has been seen in the hematology clinic by Dr. Dyann Kief.   Thrombophilia work-up was not recommended.  Anticardiolipin antibody IgM was > 150 on 11/28/2016 and 06/06/2017.  Beta-2 glycoprotein was > 150 on 11/28/2016 and 06/06/2017.  Lupus anticoagulant panel was + on 12/24/2016 and 06/06/2017.   Location: Right upper chest port  Character/quality of the symptom: The port was placed March 14, 2015 at the present time it is still flushing but it is not aspirating Severity: This creates a severe problem as there is reluctance to use the port given the circumstances but she still requires venous access  duration: Been going on for a month or so Timing/onset: Seem to have been an acute change Aggravating/context: None Relieving/modifying: None does not appear to be related to positioning.  As an aside the patient has had multiple grafts and fistulas in both arms.  These have all failed.  At the present time she is maintained via left IJ tunneled catheter and is unwilling to alter  this access.  Incidentally she notes an ulcer of the left ankle  No outpatient medications have been marked as taking for the 04/27/21 encounter (Appointment) with Delana Meyer, Dolores Lory, MD.    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   Endometrial thickening on ultra sound 04/19/2015   History of ulcerative colitis    History of uterine fibroid    Osteoporosis     Past Surgical History:  Procedure Laterality Date   CHOLECYSTECTOMY     colonoscopy  05/08/2016   LAPAROSCOPIC PARTIAL HEPATECTOMY     MASTECTOMY PARTIAL / LUMPECTOMY     UPPER GI ENDOSCOPY  05/08/2016    Social History Social History   Tobacco Use   Smoking status: Never   Smokeless tobacco: Never  Vaping Use   Vaping Use: Never used  Substance Use Topics   Alcohol use: No    Alcohol/week: 0.0 standard drinks   Drug use: No    Family History Family History  Problem Relation Age of Onset   Breast cancer Mother     Allergies  Allergen Reactions   Aspirin Other (See Comments)    Ulcerative colitis: UNABLE TO TOLERATE IF NOT ENTERIC COATED Ulcerative colitis: UNABLE TO TOLERATE IF NOT ENTERIC COATED   Brassica Napus Seed Oil Other (See Comments)   Naltrexone Other (See Comments)   Librax  [Chlordiazepoxide-Clidinium] Anxiety    agitation agitation   Povidone Iodine Itching, Rash and Other (See Comments)  Povidone-Iodine Itching and Rash   Tape Itching     REVIEW OF SYSTEMS (Negative unless checked)  Constitutional: [] Weight loss  [] Fever  [] Chills Cardiac: [] Chest pain   [] Chest pressure   [] Palpitations   [] Shortness of breath when laying flat   [] Shortness of breath with exertion. Vascular:  [] Pain in legs with walking   [] Pain in legs at rest  [] History of DVT   [] Phlebitis   [] Swelling in legs   [] Varicose veins   [] Non-healing ulcers Pulmonary:    [] Uses home oxygen   [] Productive cough   [] Hemoptysis   [] Wheeze  [] COPD   [] Asthma Neurologic:  [] Dizziness   [] Seizures   [] History of stroke   [] History of TIA  [] Aphasia   [] Vissual changes   [] Weakness or numbness in arm   [] Weakness or numbness in leg Musculoskeletal:   [] Joint swelling   [x] Joint pain   [] Low back pain Hematologic:  [] Easy bruising  [] Easy bleeding   [] Hypercoagulable state   [] Anemic Gastrointestinal:  [] Diarrhea   [] Vomiting  [] Gastroesophageal reflux/heartburn   [] Difficulty swallowing. Genitourinary:  [x] Chronic kidney disease   [] Difficult urination  [] Frequent urination   [] Blood in urine Skin:  [] Rashes   [x] Ulcers  Psychological:  [] History of anxiety   []  History of major depression.  Physical Examination  There were no vitals filed for this visit. There is no height or weight on file to calculate BMI. Gen: WD/WN, NAD Head: [place]/AT, No temporalis wasting.  Ear/Nose/Throat: Hearing grossly intact, nares w/o erythema or drainage Eyes: PER, EOMI, sclera nonicteric.  Neck: Supple, no large masses.   Pulmonary:  Good air movement, no audible wheezing bilaterally, no use of accessory muscles.  Cardiac: RRR, no JVD Vascular: Port is palpable right upper chest.  Difficult to ascertain whether this is a IJ or subclavian.  It clearly is uninfected and the skin remains healthy.  Left tunneled IJ cath is present. Vessel Right Left  Radial Palpable Palpable  PT Not Palpable Not Palpable  DP Not Palpable Not Palpable  Gastrointestinal: Non-distended. No guarding/no peritoneal signs.  Musculoskeletal: M/S 5/5 throughout.  No deformity or atrophy.  Neurologic: CN 2-12 intact. Symmetrical.  Speech is fluent. Motor exam as listed above. Psychiatric: Judgment intact, Mood & affect appropriate for pt's clinical situation. Dermatologic: No rashes + ulcers noted.  No changes consistent with cellulitis. Lymph : No lichenification or skin changes of chronic  lymphedema.  CBC Lab Results  Component Value Date   WBC 5.9 10/18/2020   HGB 15.5 (H) 10/18/2020   HCT 48.8 (H) 10/18/2020   MCV 89.2 10/18/2020   PLT 282 10/18/2020    BMET No results found for: NA, K, CL, CO2, GLUCOSE, BUN, CREATININE, CALCIUM, GFRNONAA, GFRAA CrCl cannot be calculated (No successful lab value found.).  COAG No results found for: INR, PROTIME  Radiology No results found.   Assessment/Plan 1. Cancer of gallbladder [facility]) Patient requires continued intravenous access.  Her port is no longer aspirating but it is flushing.  Given her limited access (the patient is unwilling to have her IJ tunneled catheter replaced) I have recommended a tPA infusion to see if we can restore proper function.  I have explained that this will be a continuous infusion over 2 to 3 hours which is different from just instilling Emre aspirating tPA which has been done unsuccessfully.  Risk and benefits were reviewed.  We will set this up and space specials.  If this is unsuccessful then replacement will be required.   A  total of 50 minutes was spent with this patient and greater than 50% was spent in counseling and coordination of care with the patient.  She has numerous comorbidities and is very complex situation.  Discussion included the treatment options for vascular disease including indications for surgery and intervention.  Also discussed is the appropriate timing of treatment.  In addition medical therapy was discussed.    2. End stage kidney disease ([place]) At the present time the patient has adequate dialysis access.  Continue hemodialysis as ordered without interruption.  Avoid nephrotoxic medications and dehydration.  Further plans per nephrology   3. Primary hypercoagulable state ([place]) Continue anticoagulation  4. Port-A-Cath in place See #1  5. Mixed hyperlipidemia Continue statin as ordered and reviewed, no changes at this time     Hortencia Pilar,  MD  04/27/2021 8:44 AM

## 2021-04-27 NOTE — Telephone Encounter (Signed)
Patient is schedule for TPA with Dr Delana Meyer on 05/02/21 arrival time 9 am at [facility]. Pre-procedure instructions will be mailed out to the patient. I left a message for the  patient to return call to the office.

## 2021-05-02 ENCOUNTER — Other Ambulatory Visit (INDEPENDENT_AMBULATORY_CARE_PROVIDER_SITE_OTHER): Payer: Self-pay | Admitting: Nurse Practitioner

## 2021-05-02 ENCOUNTER — Encounter (INDEPENDENT_AMBULATORY_CARE_PROVIDER_SITE_OTHER): Payer: Self-pay | Admitting: Vascular Surgery

## 2021-05-02 ENCOUNTER — Ambulatory Visit
Admission: RE | Admit: 2021-05-02 | Discharge: 2021-05-02 | Disposition: A | Discharge status: Home / Self Care | Payer: Medicare Other | Source: Ambulatory Visit | Attending: Nurse Practitioner | Admitting: Nurse Practitioner

## 2021-05-02 MED ORDER — ALTEPLASE 100 MG IV SOLR
5.0000 mg | Freq: Once | INTRAVENOUS | Status: DC
  Filled 2021-05-02: qty 5

## 2021-05-04 ENCOUNTER — Telehealth (INDEPENDENT_AMBULATORY_CARE_PROVIDER_SITE_OTHER): Payer: Self-pay

## 2021-05-04 NOTE — Telephone Encounter (Signed)
Pt called and I made her aware that she missed her appointment and that it will have to be rescheduled . The pt made me aware that she has dialysis on MWF and she needs a Tu or Th  appointment.I made the  pt aware that I could not make any promises but that I would try. The pt voiced understanding.

## 2021-05-04 NOTE — Telephone Encounter (Signed)
Called pt and made her aware that her TPA had been rescheduled  to 05/09/21 @ 9:00 AM.

## 2021-05-09 ENCOUNTER — Inpatient Hospital Stay: Payer: Medicare Other

## 2021-05-09 ENCOUNTER — Other Ambulatory Visit (INDEPENDENT_AMBULATORY_CARE_PROVIDER_SITE_OTHER): Payer: Self-pay | Admitting: Nurse Practitioner

## 2021-05-09 ENCOUNTER — Other Ambulatory Visit: Payer: Self-pay

## 2021-05-09 ENCOUNTER — Ambulatory Visit
Admission: RE | Admit: 2021-05-09 | Discharge: 2021-05-09 | Disposition: A | Discharge status: Home / Self Care | Payer: Medicare Other | Source: Ambulatory Visit | Attending: Vascular Surgery | Admitting: Vascular Surgery

## 2021-05-09 DIAGNOSIS — Z7689 Persons encountering health services in other specified circumstances: Secondary | ICD-10-CM | POA: Insufficient documentation

## 2021-05-09 MED ORDER — HEPARIN SODIUM (PORCINE) 5000 UNIT/ML IJ SOLN
INTRAMUSCULAR | Status: AC
  Filled 2021-05-09: qty 1

## 2021-05-09 MED ORDER — HEPARIN SOD (PORK) LOCK FLUSH 100 UNIT/ML IV SOLN
INTRAVENOUS | Status: AC
  Administered 2021-05-09: 500 [IU]
  Filled 2021-05-09: qty 5

## 2021-05-09 MED ORDER — SODIUM CHLORIDE 0.9 % IV SOLN: INTRAVENOUS | Status: DC

## 2021-05-09 MED ORDER — SODIUM CHLORIDE 0.9 % IV SOLN
5.0000 mg | Freq: Once | INTRAVENOUS | Status: AC
  Administered 2021-05-09: 5 mg via INTRAVENOUS
  Filled 2021-05-09: qty 5

## 2021-05-11 ENCOUNTER — Inpatient Hospital Stay: Payer: Medicare Other

## 2021-05-16 ENCOUNTER — Ambulatory Visit: Payer: Medicare Other

## 2021-05-31 ENCOUNTER — Telehealth (INDEPENDENT_AMBULATORY_CARE_PROVIDER_SITE_OTHER): Payer: Self-pay | Admitting: Vascular Surgery

## 2021-05-31 ENCOUNTER — Encounter (INDEPENDENT_AMBULATORY_CARE_PROVIDER_SITE_OTHER): Payer: Self-pay

## 2021-05-31 NOTE — Telephone Encounter (Signed)
I am in the process of scheduling this an will be in contact with the pt once I have finished

## 2021-05-31 NOTE — Telephone Encounter (Signed)
Called stating that GS preformed a test on her port to see if it would work at [facility] and it didn't. Patient states that [place] stated that he will need to replace port. Patient was calling to be scheduled. Patient also stated that she is on the way to dialysis (m,w,f) and would like someone to call and leave vm. Please advise.

## 2021-05-31 NOTE — Telephone Encounter (Signed)
It sounds like she needs to a port a cath exchange with GS

## 2021-05-31 NOTE — Telephone Encounter (Signed)
Please call patient to schedule.

## 2021-06-05 ENCOUNTER — Other Ambulatory Visit (INDEPENDENT_AMBULATORY_CARE_PROVIDER_SITE_OTHER): Payer: Self-pay | Admitting: Nurse Practitioner

## 2021-06-06 ENCOUNTER — Other Ambulatory Visit: Payer: Self-pay

## 2021-06-06 ENCOUNTER — Encounter
Admission: RE | Disposition: A | Discharge status: Home / Self Care | Payer: Self-pay | Source: Home / Self Care | Attending: Vascular Surgery

## 2021-06-06 ENCOUNTER — Ambulatory Visit
Admission: RE | Admit: 2021-06-06 | Discharge: 2021-06-06 | Disposition: A | Discharge status: Home / Self Care | Payer: Medicare Other | Attending: Vascular Surgery | Admitting: Vascular Surgery

## 2021-06-06 ENCOUNTER — Encounter: Payer: Self-pay | Admitting: Vascular Surgery

## 2021-06-06 DIAGNOSIS — E782 Mixed hyperlipidemia: Secondary | ICD-10-CM | POA: Insufficient documentation

## 2021-06-06 DIAGNOSIS — I871 Compression of vein: Secondary | ICD-10-CM

## 2021-06-06 DIAGNOSIS — T82828A Fibrosis of vascular prosthetic devices, implants and grafts, initial encounter: Secondary | ICD-10-CM

## 2021-06-06 DIAGNOSIS — D6859 Other primary thrombophilia: Secondary | ICD-10-CM | POA: Diagnosis not present

## 2021-06-06 DIAGNOSIS — Z888 Allergy status to other drugs, medicaments and biological substances status: Secondary | ICD-10-CM | POA: Diagnosis not present

## 2021-06-06 DIAGNOSIS — Z992 Dependence on renal dialysis: Secondary | ICD-10-CM | POA: Diagnosis not present

## 2021-06-06 DIAGNOSIS — Z8509 Personal history of malignant neoplasm of other digestive organs: Secondary | ICD-10-CM | POA: Insufficient documentation

## 2021-06-06 DIAGNOSIS — N186 End stage renal disease: Secondary | ICD-10-CM

## 2021-06-06 DIAGNOSIS — Y712 Prosthetic and other implants, materials and accessory cardiovascular devices associated with adverse incidents: Secondary | ICD-10-CM | POA: Diagnosis not present

## 2021-06-06 DIAGNOSIS — Y848 Other medical procedures as the cause of abnormal reaction of the patient, or of later complication, without mention of misadventure at the time of the procedure: Secondary | ICD-10-CM | POA: Insufficient documentation

## 2021-06-06 DIAGNOSIS — Z7901 Long term (current) use of anticoagulants: Secondary | ICD-10-CM | POA: Diagnosis not present

## 2021-06-06 DIAGNOSIS — Z7982 Long term (current) use of aspirin: Secondary | ICD-10-CM | POA: Diagnosis not present

## 2021-06-06 DIAGNOSIS — C23 Malignant neoplasm of gallbladder: Secondary | ICD-10-CM | POA: Diagnosis not present

## 2021-06-06 DIAGNOSIS — Z886 Allergy status to analgesic agent status: Secondary | ICD-10-CM | POA: Diagnosis not present

## 2021-06-06 DIAGNOSIS — T82514A Breakdown (mechanical) of infusion catheter, initial encounter: Secondary | ICD-10-CM | POA: Diagnosis present

## 2021-06-06 DIAGNOSIS — Z79899 Other long term (current) drug therapy: Secondary | ICD-10-CM | POA: Insufficient documentation

## 2021-06-06 HISTORY — PX: PORTA CATH INSERTION: CATH118285

## 2021-06-06 SURGERY — PORTA CATH INSERTION
Anesthesia: Moderate Sedation

## 2021-06-06 MED ORDER — FENTANYL CITRATE (PF) 100 MCG/2ML IJ SOLN
INTRAMUSCULAR | Status: DC | PRN
  Administered 2021-06-06: 50 ug via INTRAVENOUS
  Administered 2021-06-06 (×4): 25 ug via INTRAVENOUS

## 2021-06-06 MED ORDER — DIPHENHYDRAMINE HCL 50 MG/ML IJ SOLN: 50.0000 mg | Freq: Once | INTRAMUSCULAR | Status: DC | PRN

## 2021-06-06 MED ORDER — ONDANSETRON HCL 4 MG/2ML IJ SOLN: 4.0000 mg | Freq: Four times a day (QID) | INTRAMUSCULAR | Status: DC | PRN

## 2021-06-06 MED ORDER — FENTANYL CITRATE PF 50 MCG/ML IJ SOSY
PREFILLED_SYRINGE | INTRAMUSCULAR | Status: AC
  Filled 2021-06-06: qty 1

## 2021-06-06 MED ORDER — HYDROMORPHONE HCL 1 MG/ML IJ SOLN: 1.0000 mg | Freq: Once | INTRAMUSCULAR | Status: DC | PRN

## 2021-06-06 MED ORDER — MIDAZOLAM HCL 2 MG/2ML IJ SOLN
INTRAMUSCULAR | Status: DC | PRN
  Administered 2021-06-06 (×4): .5 mg via INTRAVENOUS

## 2021-06-06 MED ORDER — MIDAZOLAM HCL 2 MG/2ML IJ SOLN
INTRAMUSCULAR | Status: AC
  Filled 2021-06-06: qty 2

## 2021-06-06 MED ORDER — METHYLPREDNISOLONE SODIUM SUCC 125 MG IJ SOLR: 125.0000 mg | Freq: Once | INTRAMUSCULAR | Status: DC | PRN

## 2021-06-06 MED ORDER — MIDAZOLAM HCL 2 MG/ML PO SYRP
8.0000 mg | ORAL_SOLUTION | Freq: Once | ORAL | Status: AC | PRN
  Administered 2021-06-06: 8 mg via ORAL

## 2021-06-06 MED ORDER — FAMOTIDINE 20 MG PO TABS: 40.0000 mg | ORAL_TABLET | Freq: Once | ORAL | Status: DC | PRN

## 2021-06-06 MED ORDER — CHLORHEXIDINE GLUCONATE CLOTH 2 % EX PADS
6.0000 | MEDICATED_PAD | Freq: Every day | CUTANEOUS | Status: DC
  Administered 2021-06-06: 6 via TOPICAL

## 2021-06-06 MED ORDER — SODIUM CHLORIDE 0.9 % IV SOLN: INTRAVENOUS | Status: DC

## 2021-06-06 MED ORDER — IBUPROFEN 400 MG PO TABS
400.0000 mg | ORAL_TABLET | Freq: Once | ORAL | Status: AC
  Administered 2021-06-06: 400 mg via ORAL
  Filled 2021-06-06: qty 1

## 2021-06-06 MED ORDER — MIDAZOLAM HCL 2 MG/ML PO SYRP
ORAL_SOLUTION | ORAL | Status: AC
  Filled 2021-06-06: qty 4

## 2021-06-06 MED ORDER — CEFAZOLIN SODIUM-DEXTROSE 1-4 GM/50ML-% IV SOLN
1.0000 g | Freq: Once | INTRAVENOUS | Status: AC
  Administered 2021-06-06: 1 g via INTRAVENOUS

## 2021-06-06 SURGICAL SUPPLY — 25 items
BALLN ULTRVRSE 8X60X75C (BALLOONS) ×2
BALLOON ULTRVRSE 8X60X75C (BALLOONS) IMPLANT
CANNULA 5F STIFF (CANNULA) ×1 IMPLANT
CATH ANGIO 5F PIGTAIL 65CM (CATHETERS) ×1 IMPLANT
COVER PROBE U/S 5X48 (MISCELLANEOUS) ×1 IMPLANT
COVER SURGICAL LIGHT HANDLE (MISCELLANEOUS) ×1 IMPLANT
DERMABOND ADVANCED (GAUZE/BANDAGES/DRESSINGS) ×1
DERMABOND ADVANCED .7 DNX12 (GAUZE/BANDAGES/DRESSINGS) IMPLANT
DEVICE TORQUE (MISCELLANEOUS) ×1 IMPLANT
DRAPE INCISE IOBAN 66X45 STRL (DRAPES) ×1 IMPLANT
ENSNARE 18-30 (MISCELLANEOUS) ×1 IMPLANT
GLIDEWIRE ADV .035X180CM (WIRE) ×1 IMPLANT
GLIDEWIRE STIFF .35X180X3 HYDR (WIRE) ×1 IMPLANT
GUIDEWIRE PFTE-COATED .018X300 (WIRE) ×1 IMPLANT
KIT ENCORE 26 ADVANTAGE (KITS) ×1 IMPLANT
KIT PORT POWER 8FR ISP CVUE (Port) ×1 IMPLANT
KIT SNARE 25MM LOOP 120CM 6F (VASCULAR PRODUCTS) ×1 IMPLANT
PACK ANGIOGRAPHY (CUSTOM PROCEDURE TRAY) ×2 IMPLANT
SHEATH BRITE TIP 6FR X 23 (SHEATH) ×1 IMPLANT
SHEATH BRITE TIP 6FRX11 (SHEATH) ×1 IMPLANT
SHEATH BRITE TIP 7FRX11 (SHEATH) ×1 IMPLANT
SUT MNCRL AB 4-0 PS2 18 (SUTURE) ×1 IMPLANT
SUT VIC AB 3-0 SH 27 (SUTURE) ×2
SUT VIC AB 3-0 SH 27X BRD (SUTURE) IMPLANT
WIRE GUIDERIGHT .035X150 (WIRE) ×1 IMPLANT

## 2021-06-06 NOTE — OR Nursing (Signed)
Pt very anxious about IV start. Dr Delana Meyer ok with [place] pre medicating with 8 mg po versed, with bp 82/68

## 2021-06-06 NOTE — Interval H&P Note (Signed)
History and Physical Interval Note:  06/06/2021 11:54 AM  Connie Olsen  has presented today for surgery, with the diagnosis of Porta Cath  Placement   ESRD    Iodine Allergy.  The various methods of treatment have been discussed with the patient and family. After consideration of risks, benefits and other options for treatment, the patient has consented to  Procedure(s): PORTA CATH INSERTION (N/A) as a surgical intervention.  The patient's history has been reviewed, patient examined, no change in status, stable for surgery.  I have reviewed the patient's chart and labs.  Questions were answered to the patient's satisfaction.     Hortencia Pilar

## 2021-06-06 NOTE — Op Note (Addendum)
OPERATIVE NOTE   PROCEDURE: Removal right IJ Infuse-a-Port. Placement of a right IJ Infuse-a-Port same venous access. Ultrasound-guided access to the right common femoral vein Superior venacavogram from right femoral access Angioplasty superior vena cava and right innominate vein to 8 mm from the right jugular access. Attempted stripping of the fibrin sheath from the Infuse-a-Port catheter unsuccessful  PRE-OPERATIVE DIAGNOSIS: Complication of Infuse-a-Port with inability to aspirate.  Gallbladder carcinoma.  Poor parenteral access.  POST-OPERATIVE DIAGNOSIS: Same   SURGEON: Katha Cabal M.D.  ANESTHESIA: Conscious sedation was administered under my direct supervision by the interventional radiology RN. IV Versed plus fentanyl were utilized. Continuous ECG, pulse oximetry and blood pressure was monitored throughout the entire procedure. Conscious sedation was for a total of 103 minutes.  ESTIMATED BLOOD LOSS: Minimal   FINDING(S): 1.  Patent vein  SPECIMEN(S): None  INDICATIONS:   Connie Olsen is a 74 y.o. female who presents with a nonfunctioning right IJ port.  She has failed a tPA infusion.  She requires a port given her history of malignancy as well as her extremely poor parenteral access.  The port was placed in July 2016.  The plan is for stripping of the fibrin sheath of the port and if this is not successful and then replacement of the port.  Risk and benefits of been reviewed all questions answered patient agrees to proceed.  DESCRIPTION: After obtaining full informed written consent, the patient was brought back to the special procedure suite and placed in the supine position. The patient's right neck and chest wall are prepped and draped in sterile fashion.  At the same time her right groin was prepped and draped in a sterile fashion appropriate timeout was called.  Ultrasound is placed in a sterile sleeve, ultrasound is utilized to avoid vascular injury as well  as secondary to lack of appropriate landmarks. The right common femoral vein is identified. It is echolucent and homogeneous as well as easily compressible indicating patency. An image is recorded for the permanent record.  Access to the vein with a micropuncture needle is done under direct ultrasound visualization.  A 7 French sheath was then placed.  J-wire was then advanced under fluoroscopy into the right atrium followed by the catheter portion of the snare.  A large trilobed snare was selected.  Hand-injection contrast was then used to demonstrate the superior vena cava.  There appears to be free flow of contrast around the tunneled dialysis catheter but it is difficult to ascertain whether the Infuse-a-Port catheter is free of adhesions.  The trilobed snare is then advanced up and multiple attempts at snaring the catheter from the Infuse-a-Port are made.  On several occasions I was able to snare the dialysis catheter quite easily however I never saw any contact made with the catheter from the port.  I then switched from the snare to a pigtail catheter with the hope of encircling the catheter higher up and pulling the pigtail catheter down free the Infuse-a-Port from the wall but this was unsuccessful as well.  At this point it was clear to me that the catheter portion of the Infuse-a-Port has become encased and scar tissue along the side of the vena cava.  The only point at which the Infuse-a-Port catheter appeared to come in contact with the pigtail catheter was up above the level of the tunneled dialysis catheter.  This was just proximal to the confluence of the internal jugular vein and the subclavian vein.  I turned my attention  to the right chest wall the ultrasound was still in the sterile sleeve from the femoral stick and I then evaluated the right neck.  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  I was able to follow the catheter down to the level just behind the clavicle but  even here the surrounding tissues were densely heterogeneous with no evidence of a compressible lumen indicating the jugular was likely occluded.  Because I could not perform a completely new stick I now elected to use the same venous access knowing that this was less than ideal since the catheter itself was encased in scar tissue.  At this point I felt removing the existing port and using the catheter to guide the wire down into the venous system with the hope that by pulling the catheter back to a level just above the tunneled catheter I could reenter the true lumen and then advanced the wire and a completely new and distinct plane.  1% lidocaine is infiltrated into the soft tissue at the base of the neck as well as on the chest wall.  I then made a linear incision through the previous scar and dissected down to expose the port itself.  The port was easily removed.  However when I attempted to pull the catheter back under fluoroscopic guidance it was clearly very adherent to the surrounding soft tissues.  At this point I made a cutdown at the base of the neck and expose the catheter at this level.  I then transected the catheter at the level of the port and passed the old port off the field.  I then threaded a 0.035 advantage wire through the existing catheter so that I could maintain control.  Several attempts at getting into the true lumen were not successful   and I proceeded with attempting to pull the catheter back.  This took continuous traction to free up the catheter from the surrounding tissues as the catheter was clearly adherent and encased in scar tissue.  Ultimately I was able to free the catheter.  At 1 point I attempted to advance an 018 as a buddy wire but this did not add any benefit.  With the catheter pulled back to the level just above the dialysis catheter which would represent the distal innominate vein I now advanced the stiff angle Glidewire but again was unable to get the wire  negotiated into the true lumen of the inferior vena cava.  Since I already had the 7 French sheath in the right common femoral vein I elected to readvanced the trilobed snare and positioned it in the right atrium.  I then advanced the stiff angle Glidewire into the snare captured the wire and pulled the wire extracorporeally.  Having captured the wire proximally and distally I now was able to completely remove the Infuse-a-Port catheter without worrying that it would fracture as I would be able to maintain control of the the catheter over the wire.  Catheter was delivered into the operative field intact.  I then advanced a 23 cm 6 French Pinnacle sheath over the wire from the jugular approach so that the tip of the sheath was now in the innominate vein hand-injection of contrast demonstrated the narrowing of the innominate vein and superior vena cava as well as the true lumen.  With the Glidewire secured both at the groin level and at the jugular level I now advanced an 8 mm x 60 mm Ultraverse balloon.  3 separate inflations to 10  atm were made to free the wire from the surrounding scar tissue.  With the balloon in the inflated position I was able to advance it up and down the wire without resistance after the third inflation.  Inflations were to 8 to 10 atm for 30 seconds.  I then advanced the dilator and peel-away sheath the comes with the new Infuse-a-Port kit over the wire positioned with the tip in the and Ottoman vein.  I was then able to advance the new catheter over the wire from the jugular approach.  Once the catheter was positioned in the inferior vena cava I then removed the peel-away sheath and then the wire.  I have verified by injection through the catheter intraluminal positioning and repositioned the Infuse-a-Port so that it was in the mid atrium proximal to the area that was treated with angioplasty.  The new catheter is then transected connected to the hub and the hub is slipped into the  subcutaneous pocket on the chest wall. The hub was then accessed percutaneously and aspirates easily and flushes well and is flushed with 30 cc of heparinized saline. The pocket incision is then closed in layers using interrupted 3-0 Vicryl for the subcutaneous tissues and 4-0 Monocryl subcuticular for skin closure. Dermabond is applied. The neck counterincision was closed with 4-0 Monocryl subcuticular and Dermabond as well.  The 7 French sheath in the right common femoral was then pulled and pressure was held for 15 minutes.  The patient tolerated the procedure well and there were no immediate complications.  COMPLICATIONS: None  CONDITION: Unchanged  Katha Cabal M.D.  vein and vascular Office: [phone]   06/06/2021, 2:27 PM

## 2021-06-06 NOTE — Discharge Instructions (Signed)
Venous Groin Insertion Instructions-If you lose feeling or develop tingling or pain in your leg or foot after the procedure, please walk around first.  If the discomfort does not improve , contact your physician and proceed to the nearest emergency room.  Limit your activity for the next two days after your procedure.  Avoid stooping, bending, heavy lifting or exertion as this may put pressure on the insertion site.  Resume normal activities in 48 hours.  You may shower after 24 hours but avoid excessive warm water and do not scrub the site.  Remove clear dressing in 48 hours.  If you have had a closure device inserted, do not soak in a tub bath or a hot tub for at least one week.  No driving for 48 hours after discharge.  After the procedure, check the insertion site occasionally.  If any oozing occurs or there is apparent swelling, firm pressure over the site will prevent a bruise from forming.  You can not hurt anything by pressing directly on the site.  The pressure stops the bleeding by allowing a small clot to form.  If the bleeding continues after the pressure has been applied for more than 15 minutes, call 911 or go to the nearest emergency room.    The x-ray dye causes you to pass a considerate amount of urine.  For this reason, you will be asked to drink plenty of liquids after the procedure to prevent dehydration.  You may resume you regular diet.  Avoid caffeine products.    For pain at the site of your procedure, take non-aspirin medicines such as Tylenol.  Medications: A. Hold Metformin for 48 hours if applicable.  B. Continue taking all your present medications at home unless your doctor prescribes any changes.

## 2021-06-06 NOTE — H&P (Signed)
@LOGO @   MRN : [id number]  Connie Olsen is a 75 y.o. (07-28-1946) female who presents with chief complaint of fix my port.  History of Present Illness:  Patient presents with a nonfunctioning right IJ Infuse-a-Port associated with numerous medical problems.     history of gallbladder cancer s/p laparoscopic cholecystectomy and liver wedge biopsy by Dr Mariah Milling at [facility] on 04/15/2014.  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  Tumor invaded perimuscular connective tissue with no extension beyond the serosa or into the liver.  Margins were uninvolved (closest 0.5 mm).  There is no lymphovascular or perineural invasion.  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  One lymph node was negative.  Pathologic stage was pT2 pN0.  recurrent episodes of AV-fistula/graft thrombosis.  She has been seen in the hematology clinic by Dr. Dyann Kief.   Thrombophilia work-up was not recommended.  Anticardiolipin antibody IgM was > 150 on 11/28/2016 and 06/06/2017.  Beta-2 glycoprotein was > 150 on 11/28/2016 and 06/06/2017.  Lupus anticoagulant panel was + on 12/24/2016 and 06/06/2017.   Location: Right upper chest port  Character/quality of the symptom: The port was placed March 14, 2015 at the present time it is still flushing but it is not aspirating Severity: This creates a severe problem as there is reluctance to use the port given the circumstances but she still requires venous access  duration: Been going on for a month or so Timing/onset: Seem to have been an acute change Aggravating/context: None Relieving/modifying: None does not appear to be related to positioning.  As an aside the patient has had multiple grafts and fistulas in both arms.  These have all failed.  At the present time she is maintained via left IJ tunneled catheter and is unwilling to alter this access.   Current Meds   Medication Sig   aspirin EC 81 MG tablet Take 81 mg by mouth at bedtime.   B Complex-C-Folic Acid (DIALYVITE TABLET) TABS Take 1 tablet by mouth at bedtime.   cinacalcet (SENSIPAR) 90 MG tablet Take 90 mg by mouth See admin instructions. Take 90 mg by mouth daily with largest meal of the day.   diazepam (VALIUM) 10 MG tablet Take 10 mg by mouth daily as needed (prior to dental visits).   ferric citrate (AURYXIA) 1 GM 210 MG(Fe) tablet Take 420 mg by mouth 3 (three) times daily with meals.   ibuprofen (ADVIL) 200 MG tablet Take 200-400 mg by mouth every 6 (six) hours as needed for moderate pain.   lidocaine-prilocaine (EMLA) cream Apply 1 application topically daily as needed (prior to port use).   metoprolol tartrate (LOPRESSOR) 25 MG tablet Take 12.5 mg by mouth 2 (two) times daily.   midodrine (PROAMATINE) 10 MG tablet Take 10 mg by mouth 3 (three) times daily.   omeprazole (PRILOSEC) 20 MG capsule Take 20 mg by mouth in the morning.   pregabalin (LYRICA) 25 MG capsule Take 25 mg by mouth 3 (three) times daily.    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   Endometrial thickening on ultra sound 04/19/2015   History of ulcerative colitis    History of uterine fibroid    Osteoporosis     Past Surgical History:  Procedure Laterality Date  CHOLECYSTECTOMY     colonoscopy  05/08/2016   LAPAROSCOPIC PARTIAL HEPATECTOMY     MASTECTOMY PARTIAL / LUMPECTOMY     UPPER GI ENDOSCOPY  05/08/2016    Social History Social History   Tobacco Use   Smoking status: Never   Smokeless tobacco: Never  Vaping Use   Vaping Use: Never used  Substance Use Topics   Alcohol use: No    Alcohol/week: 0.0 standard drinks   Drug use: No    Family History Family History  Problem Relation Age of Onset   Breast cancer Mother     Allergies  Allergen  Reactions   Aspirin Other (See Comments)    Ulcerative colitis: UNABLE TO TOLERATE IF NOT ENTERIC COATED   Brassica Napus Seed Oil Other (See Comments)   Naltrexone Other (See Comments)   Librax  [Chlordiazepoxide-Clidinium] Anxiety    agitation   Povidone-Iodine Itching and Rash    Only problem if it is topical, tolerates injected contrast   Tape Itching     REVIEW OF SYSTEMS (Negative unless checked)  Constitutional: [] Weight loss  [] Fever  [] Chills Cardiac: [] Chest pain   [] Chest pressure   [] Palpitations   [] Shortness of breath when laying flat   [] Shortness of breath with exertion. Vascular:  [] Pain in legs with walking   [] Pain in legs at rest  [] History of DVT   [] Phlebitis   [] Swelling in legs   [] Varicose veins   [] Non-healing ulcers Pulmonary:   [] Uses home oxygen   [] Productive cough   [] Hemoptysis   [] Wheeze  [] COPD   [] Asthma Neurologic:  [] Dizziness   [] Seizures   [] History of stroke   [] History of TIA  [] Aphasia   [] Vissual changes   [] Weakness or numbness in arm   [] Weakness or numbness in leg Musculoskeletal:   [] Joint swelling   [] Joint pain   [] Low back pain Hematologic:  [] Easy bruising  [] Easy bleeding   [] Hypercoagulable state   [] Anemic Gastrointestinal:  [] Diarrhea   [] Vomiting  [] Gastroesophageal reflux/heartburn   [] Difficulty swallowing. Genitourinary:  [x] Chronic kidney disease   [] Difficult urination  [] Frequent urination   [] Blood in urine Skin:  [] Rashes   [] Ulcers  Psychological:  [] History of anxiety   []  History of major depression.  Physical Examination  Vitals:   06/06/21 1036 06/06/21 1049 06/06/21 1057  BP: (!) 82/68  (!) 88/64  Resp: 14 18   Temp: 98 F (36.7 C)    TempSrc: Oral    SpO2: 99%    Weight: 74.8 kg    Height: 4' 11.5" (1.511 m)     Body mass index is 32.77 kg/m. Gen: WD/WN, NAD Head: [place]/AT, No temporalis wasting.  Ear/Nose/Throat: Hearing grossly intact, nares w/o erythema or drainage Eyes: PER, EOMI, sclera nonicteric.   Neck: Supple, no gross masses or lesions.  No JVD.  Pulmonary:  Good air movement, no audible wheezing, no use of accessory muscles.  Cardiac: RRR, precordium non-hyperdynamic. Vascular:   right IJ port and left IJ tunneled catheter Vessel Right Left  Radial Palpable Palpable  Gastrointestinal: soft, non-distended. No guarding/no peritoneal signs.  Musculoskeletal: M/S 5/5 throughout.  No deformity.  Neurologic: CN 2-12 intact. Pain and light touch intact in extremities.  Symmetrical.  Speech is fluent. Motor exam as listed above. Psychiatric: Judgment intact, Mood & affect appropriate for pt's clinical situation. Dermatologic: No rashes or ulcers noted.  No changes consistent with cellulitis.   CBC Lab Results  Component Value Date   WBC 5.9 10/18/2020   HGB  15.5 (H) 10/18/2020   HCT 48.8 (H) 10/18/2020   MCV 89.2 10/18/2020   PLT 282 10/18/2020    BMET No results found for: NA, K, CL, CO2, GLUCOSE, BUN, CREATININE, CALCIUM, GFRNONAA, GFRAA CrCl cannot be calculated (No successful lab value found.).  COAG No results found for: INR, PROTIME  Radiology No results found.   Assessment/Plan 1. Cancer of gallbladder [facility]) Patient requires continued intravenous access.  Her port is no longer aspirating but it is flushing.  Given her limited access (the patient is unwilling to have her IJ tunneled catheter replaced).   Initially, I recommended a tPA infusion to see if we could restore proper function.  Unfortunately, this did not work.  She has numerous comorbidities and is very complex situation.  My plan for today is to attempt to clear the fibrin sheath off the outside of the catheter with a snare from the femoral approach.  If this does not work we are prepared to remove the existing port and replace it with a new Infuse-a-Port.  Hopefully, I can access the right IJ using a microneedle and a different area as the venous access but use the existing pocket for the new  port.  Risks and benefits of been reviewed all questions have been answered patient is in agreement with proceeding.  Discussion included the treatment options for vascular disease including indications for surgery and intervention.  Also discussed is the appropriate timing of treatment.  In addition medical therapy was discussed.     2. End stage kidney disease ([place]) At the present time the patient has adequate dialysis access.   Continue hemodialysis as ordered without interruption.  Avoid nephrotoxic medications and dehydration.  Further plans per nephrology    3. Primary hypercoagulable state ([place]) Continue anticoagulation   4. Port-A-Cath in place See #1   5. Mixed hyperlipidemia Continue statin as ordered and reviewed, no changes at this time    Hortencia Pilar, MD  06/06/2021 11:47 AM

## 2021-06-07 ENCOUNTER — Encounter: Payer: Self-pay | Admitting: Vascular Surgery

## 2021-06-20 ENCOUNTER — Inpatient Hospital Stay: Payer: Medicare Other

## 2021-06-22 ENCOUNTER — Inpatient Hospital Stay: Payer: Medicare Other

## 2021-06-26 ENCOUNTER — Encounter (INDEPENDENT_AMBULATORY_CARE_PROVIDER_SITE_OTHER): Payer: Medicare Other | Admitting: Vascular Surgery

## 2021-06-29 ENCOUNTER — Encounter (INDEPENDENT_AMBULATORY_CARE_PROVIDER_SITE_OTHER): Payer: Self-pay | Admitting: Vascular Surgery

## 2021-06-29 ENCOUNTER — Other Ambulatory Visit: Payer: Self-pay

## 2021-06-29 ENCOUNTER — Ambulatory Visit (INDEPENDENT_AMBULATORY_CARE_PROVIDER_SITE_OTHER): Payer: Medicare Other | Admitting: Vascular Surgery

## 2021-06-29 VITALS — Resp 16 | Wt 171.4 lb

## 2021-06-29 DIAGNOSIS — I10 Essential (primary) hypertension: Secondary | ICD-10-CM | POA: Diagnosis not present

## 2021-06-29 DIAGNOSIS — C23 Malignant neoplasm of gallbladder: Secondary | ICD-10-CM

## 2021-06-29 DIAGNOSIS — E782 Mixed hyperlipidemia: Secondary | ICD-10-CM

## 2021-06-29 DIAGNOSIS — I4891 Unspecified atrial fibrillation: Secondary | ICD-10-CM | POA: Diagnosis not present

## 2021-06-29 DIAGNOSIS — E1129 Type 2 diabetes mellitus with other diabetic kidney complication: Secondary | ICD-10-CM | POA: Diagnosis not present

## 2021-06-29 NOTE — Progress Notes (Signed)
MRN : [id number]  Connie Olsen is a 75 y.o. (06/13/1946) female who presents with chief complaint of check port.  History of Present Illness:   Patient returns to the office for follow-up regarding revision of her port.  Procedure performed June 06, 2021: Revision of right IJ Infuse-a-Port with replacement of a new port same venous access in association with an angioplasty of the right innominate vein to 8 mm.  Today, the patient denies any pain there have been no fever or chills.  No redness or drainage from the incision sites.  Current Meds  Medication Sig   amoxicillin (AMOXIL) 500 MG capsule Take 500 mg by mouth in the morning, at noon, in the evening, and at bedtime.   aspirin EC 81 MG tablet Take 81 mg by mouth at bedtime.   B Complex-C-Folic Acid (DIALYVITE TABLET) TABS Take 1 tablet by mouth at bedtime.   cinacalcet (SENSIPAR) 90 MG tablet Take 90 mg by mouth See admin instructions. Take 90 mg by mouth daily with largest meal of the day.   diazepam (VALIUM) 10 MG tablet Take 10 mg by mouth daily as needed (prior to dental visits).   ferric citrate (AURYXIA) 1 GM 210 MG(Fe) tablet Take 420 mg by mouth 3 (three) times daily with meals.   ibuprofen (ADVIL) 200 MG tablet Take 200-400 mg by mouth every 6 (six) hours as needed for moderate pain.   lidocaine-prilocaine (EMLA) cream Apply 1 application topically daily as needed (prior to port use).   metoprolol tartrate (LOPRESSOR) 25 MG tablet Take 12.5 mg by mouth 2 (two) times daily.   midodrine (PROAMATINE) 10 MG tablet Take 10 mg by mouth 3 (three) times daily.   omeprazole (PRILOSEC) 20 MG capsule Take 20 mg by mouth in the morning.   pregabalin (LYRICA) 25 MG capsule Take 25 mg by mouth 3 (three) times daily.    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   Endometrial  thickening on ultra sound 04/19/2015   History of ulcerative colitis    History of uterine fibroid    Osteoporosis     Past Surgical History:  Procedure Laterality Date   CHOLECYSTECTOMY     colonoscopy  05/08/2016   LAPAROSCOPIC PARTIAL HEPATECTOMY     MASTECTOMY PARTIAL / LUMPECTOMY     PORTA CATH INSERTION N/A 06/06/2021   Procedure: PORTA CATH INSERTION;  Surgeon: Katha Cabal, MD;  Location: [facility];  Service: Cardiovascular;  Laterality: N/A;   UPPER GI ENDOSCOPY  05/08/2016    Social History Social History   Tobacco Use   Smoking status: Never   Smokeless tobacco: Never  Vaping Use   Vaping Use: Never used  Substance Use Topics   Alcohol use: No    Alcohol/week: 0.0 standard drinks   Drug use: No    Family History Family History  Problem Relation Age of Onset   Breast cancer Mother     Allergies  Allergen Reactions   Aspirin Other (See Comments)    Ulcerative colitis: UNABLE TO TOLERATE IF NOT ENTERIC COATED   Brassica Napus Seed Oil Other (See Comments)   Naltrexone Other (See Comments)   Librax  [Chlordiazepoxide-Clidinium] Anxiety    agitation   Povidone-Iodine Itching and Rash    Only problem if it is topical, tolerates injected contrast  Tape Itching     REVIEW OF SYSTEMS (Negative unless checked)  Constitutional: [] Weight loss  [] Fever  [] Chills Cardiac: [] Chest pain   [] Chest pressure   [] Palpitations   [] Shortness of breath when laying flat   [] Shortness of breath with exertion. Vascular:  [] Pain in legs with walking   [] Pain in legs at rest  [] History of DVT   [] Phlebitis   [] Swelling in legs   [] Varicose veins   [] Non-healing ulcers Pulmonary:   [] Uses home oxygen   [] Productive cough   [] Hemoptysis   [] Wheeze  [] COPD   [] Asthma Neurologic:  [] Dizziness   [] Seizures   [] History of stroke   [] History of TIA  [] Aphasia   [] Vissual changes   [] Weakness or numbness in arm   [] Weakness or numbness in leg Musculoskeletal:    [] Joint swelling   [] Joint pain   [] Low back pain Hematologic:  [] Easy bruising  [] Easy bleeding   [] Hypercoagulable state   [] Anemic Gastrointestinal:  [] Diarrhea   [] Vomiting  [] Gastroesophageal reflux/heartburn   [] Difficulty swallowing. Genitourinary:  [x] Chronic kidney disease   [] Difficult urination  [] Frequent urination   [] Blood in urine Skin:  [] Rashes   [] Ulcers  Psychological:  [] History of anxiety   []  History of major depression.  Physical Examination  Vitals:   06/29/21 1324  Resp: 16  Weight: 171 lb 6.4 oz (77.7 kg)   Body mass index is 34.04 kg/m. Gen: WD/WN, NAD Head: /AT, No temporalis wasting.  Ear/Nose/Throat: Hearing grossly intact, nares w/o erythema or drainage Eyes: PER, EOMI, sclera nonicteric.  Neck: Supple, no gross masses or lesions.  No JVD.  Pulmonary:  Good air movement, no audible wheezing, no use of accessory muscles.  Cardiac: RRR, precordium non-hyperdynamic. Vascular:   Right IJ port CD&I in good position Vessel Right Left  Radial Palpable Palpable  Brachial Palpable Palpable  Gastrointestinal: soft, non-distended. No guarding/no peritoneal signs.  Musculoskeletal: M/S 5/5 throughout.  No deformity.  Neurologic: CN 2-12 intact. Pain and light touch intact in extremities.  Symmetrical.  Speech is fluent. Motor exam as listed above. Psychiatric: Judgment intact, Mood & affect appropriate for pt's clinical situation. Dermatologic: No rashes or ulcers noted.  No changes consistent with cellulitis.   CBC Lab Results  Component Value Date   WBC 5.9 10/18/2020   HGB 15.5 (H) 10/18/2020   HCT 48.8 (H) 10/18/2020   MCV 89.2 10/18/2020   PLT 282 10/18/2020    BMET No results found for: NA, K, CL, CO2, GLUCOSE, BUN, CREATININE, CALCIUM, GFRNONAA, GFRAA CrCl cannot be calculated (No successful lab value found.).  COAG No results found for: INR, PROTIME  Radiology PERIPHERAL VASCULAR CATHETERIZATION  Result Date: 06/06/2021 See surgical  note for result.    Assessment/Plan 1. 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  She is currently doing well and has adequate venous access.  She will follow-up with the cancer center for routine port care and flushing.  She will follow-up with me as needed  2. Primary hypertension Continue antihypertensive medications as already ordered, these medications have been reviewed and there are no changes at this time.   3. Atrial fibrillation, unspecified type ([place]) Continue antiarrhythmia medications as already ordered, these medications have been reviewed and there are no changes at this time.  Continue anticoagulation as ordered by Cardiology Service   4. Type 2 diabetes mellitus with other diabetic kidney complication (HCC) Continue hypoglycemic medications as already ordered, these medications have been reviewed and there are  no changes at this time.  Hgb A1C to be monitored as already arranged by primary service   5. Mixed hyperlipidemia Continue statin as ordered and reviewed, no changes at this time    Hortencia Pilar, MD  06/29/2021 1:34 PM

## 2021-06-30 ENCOUNTER — Encounter (INDEPENDENT_AMBULATORY_CARE_PROVIDER_SITE_OTHER): Payer: Self-pay | Admitting: Vascular Surgery

## 2021-07-04 ENCOUNTER — Inpatient Hospital Stay: Payer: Medicare Other | Attending: Oncology

## 2021-07-04 ENCOUNTER — Other Ambulatory Visit: Payer: Self-pay

## 2021-07-04 DIAGNOSIS — C23 Malignant neoplasm of gallbladder: Secondary | ICD-10-CM | POA: Insufficient documentation

## 2021-07-04 DIAGNOSIS — Z95828 Presence of other vascular implants and grafts: Secondary | ICD-10-CM

## 2021-07-04 MED ORDER — SODIUM CHLORIDE 0.9% FLUSH
10.0000 mL | Freq: Once | INTRAVENOUS | Status: AC
  Administered 2021-07-04: 10 mL via INTRAVENOUS
  Filled 2021-07-04: qty 10

## 2021-07-04 MED ORDER — HEPARIN SOD (PORK) LOCK FLUSH 100 UNIT/ML IV SOLN
500.0000 [IU] | Freq: Once | INTRAVENOUS | Status: AC
  Administered 2021-07-04: 500 [IU] via INTRAVENOUS
  Filled 2021-07-04: qty 5

## 2021-07-20 ENCOUNTER — Ambulatory Visit: Payer: Medicare Other | Admitting: Oncology

## 2021-08-08 ENCOUNTER — Other Ambulatory Visit: Payer: Self-pay

## 2021-08-08 ENCOUNTER — Inpatient Hospital Stay: Payer: Medicare Other | Attending: Oncology

## 2021-08-08 DIAGNOSIS — Z95828 Presence of other vascular implants and grafts: Secondary | ICD-10-CM

## 2021-08-08 DIAGNOSIS — Z8509 Personal history of malignant neoplasm of other digestive organs: Secondary | ICD-10-CM | POA: Insufficient documentation

## 2021-08-08 MED ORDER — HEPARIN SOD (PORK) LOCK FLUSH 100 UNIT/ML IV SOLN
500.0000 [IU] | Freq: Once | INTRAVENOUS | Status: AC
  Administered 2021-08-08: 500 [IU] via INTRAVENOUS
  Filled 2021-08-08: qty 5

## 2021-08-08 MED ORDER — SODIUM CHLORIDE 0.9% FLUSH
10.0000 mL | Freq: Once | INTRAVENOUS | Status: AC
  Administered 2021-08-08: 10 mL via INTRAVENOUS
  Filled 2021-08-08: qty 10

## 2021-08-10 ENCOUNTER — Inpatient Hospital Stay: Payer: Medicare Other

## 2021-09-06 ENCOUNTER — Other Ambulatory Visit: Payer: Self-pay | Admitting: *Deleted

## 2021-09-06 DIAGNOSIS — C23 Malignant neoplasm of gallbladder: Secondary | ICD-10-CM

## 2021-09-06 DIAGNOSIS — R7989 Other specified abnormal findings of blood chemistry: Secondary | ICD-10-CM

## 2021-09-12 ENCOUNTER — Inpatient Hospital Stay: Payer: Medicare Other | Attending: Oncology

## 2021-09-12 ENCOUNTER — Ambulatory Visit: Payer: Medicare Other | Admitting: Internal Medicine

## 2021-09-12 ENCOUNTER — Other Ambulatory Visit: Payer: Self-pay

## 2021-09-12 DIAGNOSIS — C23 Malignant neoplasm of gallbladder: Secondary | ICD-10-CM | POA: Insufficient documentation

## 2021-09-12 DIAGNOSIS — Z95828 Presence of other vascular implants and grafts: Secondary | ICD-10-CM

## 2021-09-12 DIAGNOSIS — D0512 Intraductal carcinoma in situ of left breast: Secondary | ICD-10-CM | POA: Insufficient documentation

## 2021-09-12 MED ORDER — HEPARIN SOD (PORK) LOCK FLUSH 100 UNIT/ML IV SOLN
500.0000 [IU] | Freq: Once | INTRAVENOUS | Status: AC
  Administered 2021-09-12: 500 [IU] via INTRAVENOUS
  Filled 2021-09-12: qty 5

## 2021-09-12 MED ORDER — SODIUM CHLORIDE 0.9% FLUSH
10.0000 mL | INTRAVENOUS | Status: DC | PRN
Start: 2021-09-12 — End: 2021-09-12
  Administered 2021-09-12: 10 mL via INTRAVENOUS
  Filled 2021-09-12: qty 10

## 2021-09-14 ENCOUNTER — Inpatient Hospital Stay: Payer: Medicare Other

## 2021-10-24 ENCOUNTER — Other Ambulatory Visit: Payer: Self-pay

## 2021-10-24 ENCOUNTER — Inpatient Hospital Stay: Payer: Medicare Other | Attending: Internal Medicine

## 2021-10-24 DIAGNOSIS — C23 Malignant neoplasm of gallbladder: Secondary | ICD-10-CM | POA: Insufficient documentation

## 2021-10-24 DIAGNOSIS — Z95828 Presence of other vascular implants and grafts: Secondary | ICD-10-CM

## 2021-10-24 MED ORDER — HEPARIN SOD (PORK) LOCK FLUSH 100 UNIT/ML IV SOLN
500.0000 [IU] | Freq: Once | INTRAVENOUS | Status: AC
  Administered 2021-10-24: 500 [IU] via INTRAVENOUS
  Filled 2021-10-24: qty 5

## 2021-10-24 MED ORDER — SODIUM CHLORIDE 0.9% FLUSH
10.0000 mL | Freq: Once | INTRAVENOUS | Status: AC
  Administered 2021-10-24: 10 mL via INTRAVENOUS
  Filled 2021-10-24: qty 10

## 2021-12-05 ENCOUNTER — Inpatient Hospital Stay: Payer: Medicare Other | Attending: Internal Medicine

## 2021-12-05 DIAGNOSIS — Z8509 Personal history of malignant neoplasm of other digestive organs: Secondary | ICD-10-CM | POA: Insufficient documentation

## 2021-12-05 DIAGNOSIS — Z95828 Presence of other vascular implants and grafts: Secondary | ICD-10-CM

## 2021-12-05 DIAGNOSIS — Z452 Encounter for adjustment and management of vascular access device: Secondary | ICD-10-CM | POA: Insufficient documentation

## 2021-12-05 MED ORDER — HEPARIN SOD (PORK) LOCK FLUSH 100 UNIT/ML IV SOLN
500.0000 [IU] | Freq: Once | INTRAVENOUS | Status: AC
  Filled 2021-12-05: qty 5

## 2021-12-05 MED ORDER — SODIUM CHLORIDE 0.9% FLUSH
10.0000 mL | Freq: Once | INTRAVENOUS | Status: AC
  Administered 2021-12-05: 10 mL via INTRAVENOUS
  Filled 2021-12-05: qty 10

## 2021-12-05 MED ORDER — SODIUM CHLORIDE 0.9% FLUSH
10.0000 mL | Freq: Once | INTRAVENOUS | Status: AC
  Filled 2021-12-05: qty 10

## 2021-12-05 MED ORDER — HEPARIN SOD (PORK) LOCK FLUSH 100 UNIT/ML IV SOLN
500.0000 [IU] | Freq: Once | INTRAVENOUS | Status: AC
  Administered 2021-12-05: 500 [IU] via INTRAVENOUS
  Filled 2021-12-05: qty 5

## 2021-12-16 ENCOUNTER — Ambulatory Visit
Admission: EM | Admit: 2021-12-16 | Discharge: 2021-12-16 | Disposition: A | Discharge status: Home / Self Care | Payer: Medicare Other | Attending: Physician Assistant | Admitting: Physician Assistant

## 2021-12-16 ENCOUNTER — Encounter: Payer: Self-pay | Admitting: Emergency Medicine

## 2021-12-16 ENCOUNTER — Ambulatory Visit (INDEPENDENT_AMBULATORY_CARE_PROVIDER_SITE_OTHER): Payer: Medicare Other

## 2021-12-16 ENCOUNTER — Emergency Department: Admit: 2021-12-16 | Payer: Self-pay

## 2021-12-16 ENCOUNTER — Other Ambulatory Visit: Payer: Self-pay

## 2021-12-16 DIAGNOSIS — N186 End stage renal disease: Secondary | ICD-10-CM | POA: Diagnosis not present

## 2021-12-16 DIAGNOSIS — E1140 Type 2 diabetes mellitus with diabetic neuropathy, unspecified: Secondary | ICD-10-CM | POA: Insufficient documentation

## 2021-12-16 DIAGNOSIS — K5190 Ulcerative colitis, unspecified, without complications: Secondary | ICD-10-CM | POA: Diagnosis not present

## 2021-12-16 DIAGNOSIS — Z79899 Other long term (current) drug therapy: Secondary | ICD-10-CM | POA: Insufficient documentation

## 2021-12-16 DIAGNOSIS — D509 Iron deficiency anemia, unspecified: Secondary | ICD-10-CM | POA: Diagnosis not present

## 2021-12-16 DIAGNOSIS — R059 Cough, unspecified: Secondary | ICD-10-CM | POA: Diagnosis not present

## 2021-12-16 DIAGNOSIS — R062 Wheezing: Secondary | ICD-10-CM | POA: Diagnosis present

## 2021-12-16 DIAGNOSIS — E785 Hyperlipidemia, unspecified: Secondary | ICD-10-CM | POA: Insufficient documentation

## 2021-12-16 DIAGNOSIS — E1122 Type 2 diabetes mellitus with diabetic chronic kidney disease: Secondary | ICD-10-CM | POA: Diagnosis not present

## 2021-12-16 DIAGNOSIS — R051 Acute cough: Secondary | ICD-10-CM | POA: Diagnosis present

## 2021-12-16 DIAGNOSIS — Z853 Personal history of malignant neoplasm of breast: Secondary | ICD-10-CM | POA: Insufficient documentation

## 2021-12-16 DIAGNOSIS — I4891 Unspecified atrial fibrillation: Secondary | ICD-10-CM | POA: Diagnosis not present

## 2021-12-16 DIAGNOSIS — J209 Acute bronchitis, unspecified: Secondary | ICD-10-CM | POA: Insufficient documentation

## 2021-12-16 DIAGNOSIS — Z992 Dependence on renal dialysis: Secondary | ICD-10-CM | POA: Diagnosis not present

## 2021-12-16 DIAGNOSIS — K2270 Barrett's esophagus without dysplasia: Secondary | ICD-10-CM | POA: Insufficient documentation

## 2021-12-16 DIAGNOSIS — Z20822 Contact with and (suspected) exposure to covid-19: Secondary | ICD-10-CM | POA: Diagnosis not present

## 2021-12-16 LAB — RESP PANEL BY RT-PCR (FLU A&B, COVID) ARPGX2
Influenza A by PCR: NEGATIVE
Influenza B by PCR: NEGATIVE
SARS Coronavirus 2 by RT PCR: NEGATIVE

## 2021-12-16 MED ORDER — CHERATUSSIN AC 100-10 MG/5ML PO SOLN: 5.0000 mL | Freq: Four times a day (QID) | ORAL | 0 refills | Status: DC | PRN

## 2021-12-16 MED ORDER — PREDNISONE 20 MG PO TABS: 40.0000 mg | ORAL_TABLET | Freq: Every day | ORAL | 0 refills | Status: AC

## 2021-12-16 MED ORDER — NEBULIZER/TUBING/MOUTHPIECE KIT
PACK | 0 refills | Status: DC
Start: 2021-12-16 — End: 2023-04-11

## 2021-12-16 MED ORDER — ALBUTEROL SULFATE (2.5 MG/3ML) 0.083% IN NEBU: 2.5000 mg | INHALATION_SOLUTION | Freq: Four times a day (QID) | RESPIRATORY_TRACT | 0 refills | Status: DC | PRN

## 2021-12-16 NOTE — ED Provider Notes (Signed)
?[facility] ? ? ? ?CSN: [id number] ?Arrival date & time: 12/16/21  1334 ? ? ?  ? ?History   ?Chief Complaint ?Chief Complaint  ?Patient presents with  ? Cough  ? ? ?HPI ?Connie Olsen is a 76 y.o. female presenting for approximately 5-day history of cough, congestion, wheezing, shortness of breath, low-grade temps, nasal congestion/runny nose.  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  Patient's dialysis nurse practitioner prescribed her Augmentin yesterday.  She took the first dose about 2 hours ago.  Patient reports taking a breathing treatment about 2 hours ago.  She says it was helpful.  Does not report any associated fevers.  Patient does state that her son and grandchildren have been sick with similar symptoms.  Patient performed a home COVID test 2 days ago when her temp was up to 100 degrees.  She says it was negative.  Patient denies any history of respiratory problems. ? ?Other medical history significant for ulcerative colitis, hyperlipidemia, neuropathy, chronic pain, iron deficiency anemia, type 2 diabetes, coagulation defect, atrial fibrillation, osteoporosis and history of cholangiocarcinoma, breast cancer, and Barrett's esophagus. ? ?HPI ? ?Past Medical History:  ?Diagnosis Date  ? Barrett's esophagus without dysplasia   ? Cholangiocarcinoma ([place])   ? Chronic kidney disease   ? 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 Endometrial thickening on ultra sound 04/19/2015  ? History of ulcerative colitis   ? History of uterine fibroid   ? Osteoporosis   ? ? ?Patient Active Problem List  ? Diagnosis Date Noted  ? Erythrocytosis 09/06/2020  ? Chronic hypotension 06/16/2020  ? Complication associated with dialysis catheter 06/16/2020  ? Hyperlipidemia 06/16/2020  ? Ulcerative colitis ([place]) 06/16/2020  ? Bilateral hand pain 02/02/2020  ? Bilateral hand numbness 12/10/2019  ? Burning sensation 12/10/2019  ? Abnormality of  albumin 10/15/2019  ? Allergy, unspecified, initial encounter 10/13/2019  ? Anaphylactic shock, unspecified, initial encounter 10/13/2019  ? Hyperparathyroidism ([place]) 09/30/2019  ? Hyperphosphatemia 09/30/2019  ? Neuropathy 09/30/2019  ? Depression 09/30/2019  ? Port-A-Cath in place 06/23/2019  ? Pruritus, unspecified 09/29/2018  ? Insomnia 06/20/2017  ? Seborrheic keratoses 06/20/2017  ? Primary hypercoagulable state ([place]) 05/21/2017  ? Other specified complication of vascular prosthetic devices, implants and grafts, initial encounter ([place]) 05/01/2017  ? Shortness of breath 02/27/2017  ? Other chronic pain 09/22/2016  ? Irritable bowel syndrome 05/24/2016  ? Barrett's esophagus without dysplasia 05/16/2016  ? Encounter for fitting and adjustment of extracorporeal dialysis catheter ([place]) 05/16/2016  ? Unspecified open wound of left forearm, initial encounter 01/02/2016  ? Osteoporosis 11/30/2015  ? Hypocalcemia 11/04/2015  ? Mild protein-calorie malnutrition ([place]) 06/20/2015  ? 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  ? Encounter for immunization 06/16/2015  ? Endometrial thickening on ultrasound 04/19/2015  ? Generalized abdominal pain 04/04/2015  ? H/O female genital system disorder 03/08/2015  ? H/O chronic ulcerative colitis 03/08/2015  ? Poor venous access 03/04/2015  ? Anemia in chronic kidney disease 02/12/2015  ? Bacteremia 02/12/2015  ? Coagulation defect, unspecified ([place]) 02/12/2015  ? Type 2 diabetes mellitus with other diabetic kidney complication ([place]) 63/87/5643  ? Hypokalemia 02/12/2015  ? Iron deficiency anemia, unspecified 02/12/2015  ? Other ascites 02/12/2015  ? 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 BP (high blood pressure) 11/16/2014  ? Adiposity 11/16/2014  ? Encounter for removal of sutures 11/16/2014  ? DCIS (ductal carcinoma in situ) of breast  11/02/2014  ? 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 A-fib ([place]) 02/04/2014  ? End stage renal  disease ([place]) 02/04/2014  ? ? ?Past Surgical History:  ?Procedure Laterality Date  ? CHOLECYSTECTOMY    ? colonoscopy  05/08/2016  ? LAPAROSCOPIC PARTIAL HEPATECTOMY    ? MASTECTOMY PARTIAL / LUMPECTOMY    ? PORTA CATH INSERTION N/A 06/06/2021  ? Procedure: PORTA CATH INSERTION;  Surgeon: Katha Cabal, MD;  Location: [facility];  Service: Cardiovascular;  Laterality: N/A;  ? UPPER GI ENDOSCOPY  05/08/2016  ? ? ?OB History   ? ? Gravida  ?2  ? Para  ?2  ? Term  ?2  ? Preterm  ?   ? AB  ?   ? Living  ?2  ?  ? ? SAB  ?   ? IAB  ?   ? Ectopic  ?   ? Multiple  ?   ? Live Births  ?   ?   ?  ?  ? ? ? ?Home Medications   ? ?Prior to Admission medications   ?Medication Sig Start Date End Date Taking? Authorizing Provider  ?albuterol (PROVENTIL) (2.5 MG/3ML) 0.083% nebulizer solution Take 3 mLs (2.5 mg total) by nebulization every 6 (six) hours as needed for wheezing or shortness of breath. 12/16/21  Yes Laurene Footman B, PA-C  ?amoxicillin-clavulanate (AUGMENTIN) 250-125 MG tablet Take by mouth. 12/15/21  Yes [provider]  ?aspirin EC 81 MG tablet Take 81 mg by mouth at bedtime.   Yes [provider]  ?B Complex-C-Folic Acid (DIALYVITE TABLET) TABS Take 1 tablet by mouth at bedtime. 07/14/14  Yes [provider]  ?diazepam (VALIUM) 10 MG tablet Take 10 mg by mouth daily as needed (prior to dental visits). 01/02/19  Yes [provider]  ?ferric citrate (AURYXIA) 1 GM 210 MG(Fe) tablet Take 420 mg by mouth 3 (three) times daily with meals. 09/05/18  Yes [provider]  ?guaiFENesin-codeine (CHERATUSSIN AC) 100-10 MG/5ML syrup Take 5 mLs by mouth 4 (four) times daily as needed for cough. 12/16/21  Yes Laurene Footman B, PA-C  ?metoprolol tartrate (LOPRESSOR) 25 MG tablet Take 12.5 mg by mouth 2 (two) times daily. 08/14/16  Yes [provider]  ?midodrine (PROAMATINE) 10 MG tablet Take 10 mg by mouth 3 (three) times daily. 08/10/15  Yes [provider]   ?omeprazole (PRILOSEC) 20 MG capsule Take 20 mg by mouth in the morning. 11/09/16  Yes [provider]  ?predniSONE (DELTASONE) 20 MG tablet Take 2 tablets (40 mg total) by mouth daily for 5 days. 12/16/21 12/21/21 Yes Laurene Footman B, PA-C  ?pregabalin (LYRICA) 25 MG capsule Take 25 mg by mouth 3 (three) times daily. 06/22/20  Yes [provider]  ?Respiratory Therapy Supplies (NEBULIZER/TUBING/MOUTHPIECE) KIT 1 nebulizer kit 12/16/21  Yes Laurene Footman B, PA-C  ?amoxicillin (AMOXIL) 500 MG capsule Take 500 mg by mouth in the morning, at noon, in the evening, and at bedtime.    [provider]  ?cinacalcet (SENSIPAR) 90 MG tablet Take 90 mg by mouth See admin instructions. Take 90 mg by mouth daily with largest meal of the day. 03/11/15   [provider]  ?ibuprofen (ADVIL) 200 MG tablet Take 200-400 mg by mouth every 6 (six) hours as needed for moderate pain. 04/05/15   [provider]  ?lidocaine-prilocaine (EMLA) cream Apply 1 application topically daily as needed (prior to port use). 06/04/14   [provider]  ? ? ?  Family History ?Family History  ?Problem Relation Age of Onset  ? Breast cancer Mother   ? ? ?Social History ?Social History  ? ?Tobacco Use  ? Smoking status: Never  ? Smokeless tobacco: Never  ?Vaping Use  ? Vaping Use: Never used  ?Substance Use Topics  ? Alcohol use: No  ?  Alcohol/week: 0.0 standard drinks  ? Drug use: No  ? ? ? ?Allergies   ?Aspirin, Brassica napus seed oil, Naltrexone, Librax  [chlordiazepoxide-clidinium], Povidone-iodine, and Tape ? ? ?Review of Systems ?Review of Systems  ?Constitutional:  Positive for fatigue. Negative for chills, diaphoresis and fever.  ?HENT:  Positive for congestion and rhinorrhea. Negative for ear pain, sinus pressure, sinus pain and sore throat.   ?Respiratory:  Positive for cough, shortness of breath and wheezing.   ?Cardiovascular:  Negative for chest pain.  ?Gastrointestinal:  Negative for abdominal  pain, nausea and vomiting.  ?Musculoskeletal:  Negative for arthralgias and myalgias.  ?Skin:  Negative for rash.  ?Neurological:  Negative for weakness and headaches.  ?Hematological:  Negative for adenopathy.  ? ? ?Ph

## 2021-12-16 NOTE — Discharge Instructions (Signed)
-  The chest x-ray does not show any evidence of pneumonia or fluid in your lungs.  Symptoms likely due to bronchitis.  Most the time this is due to a virus and it sounds acute been exposed to other sick family members with similar symptoms only it hit you harder. ?- We are testing for COVID and flu and we will call you with the results when they come in today.  If you have COVID-19 I can send antiviral medicine for you.  You would need to isolate 5 days and wear a mask for 5 days. ?- Continue taking the Augmentin as prescribed and using the rate and using the breathing treatments as advised for any shortness of breath or wheezing. ?- Increase rest and fluids. ?- For any severe acute worsening of your breathing or weakness, you should go to emergency department for evaluation. ?

## 2021-12-16 NOTE — ED Triage Notes (Signed)
Pt c/o cough, wheezing, shortness of breath, low grade fever, nasal congestion, runny nose. She had a breathing treatment about 12:45. Pt states her dialysis NP gave her Augmentin yesterday.  ?

## 2022-01-02 ENCOUNTER — Telehealth: Payer: Self-pay | Admitting: Internal Medicine

## 2022-01-02 NOTE — Telephone Encounter (Signed)
Pt called in to check appts, req to r/s appts for July, pt states that she cannot do fridays.Cherylann Banas  ?

## 2022-01-16 ENCOUNTER — Inpatient Hospital Stay: Payer: Medicare Other | Attending: Internal Medicine

## 2022-01-16 DIAGNOSIS — Z95828 Presence of other vascular implants and grafts: Secondary | ICD-10-CM

## 2022-01-16 DIAGNOSIS — Z853 Personal history of malignant neoplasm of breast: Secondary | ICD-10-CM | POA: Insufficient documentation

## 2022-01-16 DIAGNOSIS — Z452 Encounter for adjustment and management of vascular access device: Secondary | ICD-10-CM | POA: Diagnosis not present

## 2022-01-16 DIAGNOSIS — Z8509 Personal history of malignant neoplasm of other digestive organs: Secondary | ICD-10-CM | POA: Diagnosis not present

## 2022-01-16 MED ORDER — HEPARIN SOD (PORK) LOCK FLUSH 100 UNIT/ML IV SOLN
500.0000 [IU] | Freq: Once | INTRAVENOUS | Status: AC
  Administered 2022-01-16: 500 [IU] via INTRAVENOUS
  Filled 2022-01-16: qty 5

## 2022-01-16 MED ORDER — SODIUM CHLORIDE 0.9% FLUSH
10.0000 mL | Freq: Once | INTRAVENOUS | Status: AC
  Administered 2022-01-16: 10 mL via INTRAVENOUS
  Filled 2022-01-16: qty 10

## 2022-03-27 ENCOUNTER — Ambulatory Visit: Payer: Medicare Other | Admitting: Internal Medicine

## 2022-03-27 ENCOUNTER — Inpatient Hospital Stay: Payer: Medicare Other

## 2022-03-27 ENCOUNTER — Encounter: Payer: Self-pay | Admitting: Medical Oncology

## 2022-03-27 ENCOUNTER — Inpatient Hospital Stay: Payer: Medicare Other | Attending: Internal Medicine

## 2022-03-27 ENCOUNTER — Inpatient Hospital Stay (HOSPITAL_BASED_OUTPATIENT_CLINIC_OR_DEPARTMENT_OTHER): Payer: Medicare Other | Admitting: Medical Oncology

## 2022-03-27 VITALS — BP 98/41 | HR 85 | Temp 98.8°F | Resp 20 | Wt 165.0 lb

## 2022-03-27 DIAGNOSIS — N186 End stage renal disease: Secondary | ICD-10-CM | POA: Diagnosis not present

## 2022-03-27 DIAGNOSIS — D0512 Intraductal carcinoma in situ of left breast: Secondary | ICD-10-CM | POA: Diagnosis present

## 2022-03-27 DIAGNOSIS — Z992 Dependence on renal dialysis: Secondary | ICD-10-CM | POA: Insufficient documentation

## 2022-03-27 DIAGNOSIS — C23 Malignant neoplasm of gallbladder: Secondary | ICD-10-CM | POA: Insufficient documentation

## 2022-03-27 DIAGNOSIS — Z803 Family history of malignant neoplasm of breast: Secondary | ICD-10-CM | POA: Insufficient documentation

## 2022-03-27 DIAGNOSIS — Z95828 Presence of other vascular implants and grafts: Secondary | ICD-10-CM

## 2022-03-27 DIAGNOSIS — S91332A Puncture wound without foreign body, left foot, initial encounter: Secondary | ICD-10-CM | POA: Insufficient documentation

## 2022-03-27 DIAGNOSIS — N189 Chronic kidney disease, unspecified: Secondary | ICD-10-CM | POA: Diagnosis not present

## 2022-03-27 MED ORDER — HEPARIN SOD (PORK) LOCK FLUSH 100 UNIT/ML IV SOLN
500.0000 [IU] | Freq: Once | INTRAVENOUS | Status: AC
  Administered 2022-03-27: 500 [IU] via INTRAVENOUS
  Filled 2022-03-27: qty 5

## 2022-03-27 MED ORDER — SODIUM CHLORIDE 0.9% FLUSH
10.0000 mL | INTRAVENOUS | Status: DC | PRN
  Administered 2022-03-27: 10 mL via INTRAVENOUS
  Filled 2022-03-27: qty 10

## 2022-03-27 MED ORDER — DOXYCYCLINE HYCLATE 100 MG PO TABS: 100.0000 mg | ORAL_TABLET | Freq: Two times a day (BID) | ORAL | 0 refills | Status: AC

## 2022-03-27 NOTE — Progress Notes (Signed)
[place] CONSULT NOTE  Patient Care Team: [place], [facility] as PCP - General Lequita Asal, MD (Inactive) as Referring Physician (Hematology and Oncology) Cammie Sickle, MD as Consulting Physician (Hematology and Oncology)  CHIEF COMPLAINTS/PURPOSE OF CONSULTATION: gall bladder cancer/DCIS/   #  Oncology History Overview Note  Laparoscopic cholecystectomy, 04/15/14; Radical cholecystectomy with portal LAD, segment 4B/5 partial hepatectomy, 05/27/14.  found to have a gallbladder mass on CT that was followed by an MRI that raised concern for gallbladder cancer as well as a bile duct stricture followed by EUS and ERCP that revealed no further pathology. She was taken to the OR 8/13 for a lap chole with the intent that should frozen pathology demonstrate concern for cancer, a more radical resection would be done. However, at the time of surgery, though frozen section was negative for cancer, final pathology demonstrated a T2N0 well differentiated adenocarcinoma, margins negative.  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 Cena Benton       HISTORY OF PRESENTING ILLNESS:  Connie Olsen 76 y.o.  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  Patient has a history of DCIS and cancer of the gallbladder. Both of these are monitored and managed by [facility]. We are managing her port only per patient. She is behind on her follow [facility] with [facility] by about 6 months. She reports that she will be getting this rescheduled today.   Since her last visit she has had her  port replaced. She is behind of port flushes- had one today. She denies any pain, redness, swelling at port site.   Mentions a wound of her left foot that she has had for "months". Previusly scabbed over. Scbas came off a few weeks ago. Has appointment with wound clinic for 2 months from now for this - ordered by dialysis. No fevers, bleeding, vomiting. Purulent green/yellow discharge.    Review of Systems  Constitutional:  Positive for malaise/fatigue. Negative for chills, diaphoresis and fever.  HENT:  Negative for nosebleeds and sore throat.   Eyes:  Negative for double vision.  Respiratory:  Negative for cough, hemoptysis, sputum production, shortness of breath and wheezing.   Cardiovascular:  Negative for chest pain, palpitations, orthopnea and leg swelling.  Gastrointestinal:  Negative for abdominal pain, blood in stool, constipation, diarrhea, heartburn, melena, nausea and vomiting.  Genitourinary:  Negative for dysuria, frequency and urgency.  Musculoskeletal:  Positive for back pain. Negative for joint pain.  Skin: Negative.  Negative for itching and rash.  Neurological:  Negative for dizziness, tingling, focal weakness, weakness and headaches.  Endo/Heme/Allergies:  Does not bruise/bleed easily.  Psychiatric/Behavioral:  Negative for depression. The patient is not nervous/anxious and does not have insomnia.      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   Endometrial thickening on ultra sound 04/19/2015   History of ulcerative colitis    History of uterine fibroid    Osteoporosis  SURGICAL HISTORY: Past Surgical History:  Procedure Laterality Date   CHOLECYSTECTOMY     colonoscopy  05/08/2016   LAPAROSCOPIC PARTIAL HEPATECTOMY     MASTECTOMY PARTIAL / LUMPECTOMY     PORTA CATH INSERTION N/A 06/06/2021    Procedure: PORTA CATH INSERTION;  Surgeon: Katha Cabal, MD;  Location: [facility];  Service: Cardiovascular;  Laterality: N/A;   UPPER GI ENDOSCOPY  05/08/2016    SOCIAL HISTORY: Social History   Socioeconomic History   Marital status: Legally Separated    Spouse name: Not on file   Number of children: Not on file   Years of education: Not on file   Highest education level: Not on file  Occupational History   Not on file  Tobacco Use   Smoking status: Never   Smokeless tobacco: Never  Vaping Use   Vaping Use: Never used  Substance and Sexual Activity   Alcohol use: No    Alcohol/week: 0.0 standard drinks of alcohol   Drug use: No   Sexual activity: Not on file  Other Topics Concern   Not on file  Social History Narrative   Not on file   Social Determinants of Health   Financial Resource Strain: Not on file  Food Insecurity: Not on file  Transportation Needs: Not on file  Physical Activity: Not on file  Stress: Not on file  Social Connections: Not on file  Intimate Partner Violence: Not on file    FAMILY HISTORY: Family History  Problem Relation Age of Onset   Breast cancer Mother     ALLERGIES:  is allergic to aspirin, brassica napus seed oil, naltrexone, librax  [chlordiazepoxide-clidinium], povidone-iodine, and tape.  MEDICATIONS:  Current Outpatient Medications  Medication Sig Dispense Refill   aspirin EC 81 MG tablet Take 81 mg by mouth at bedtime.     B Complex-C-Folic Acid (DIALYVITE TABLET) TABS Take 1 tablet by mouth at bedtime.     cinacalcet (SENSIPAR) 90 MG tablet Take 90 mg by mouth See admin instructions. Take 90 mg by mouth daily with largest meal of the day.  11   ferric citrate (AURYXIA) 1 GM 210 MG(Fe) tablet Take 420 mg by mouth 3 (three) times daily with meals.     guaiFENesin-codeine (CHERATUSSIN AC) 100-10 MG/5ML syrup Take 5 mLs by mouth 4 (four) times daily as needed for cough. 118 mL 0   ibuprofen (ADVIL) 200 MG tablet  Take 200-400 mg by mouth every 6 (six) hours as needed for moderate pain.     lidocaine-prilocaine (EMLA) cream Apply 1 application topically daily as needed (prior to port use).     metoprolol tartrate (LOPRESSOR) 25 MG tablet Take 12.5 mg by mouth 2 (two) times daily.  1   midodrine (PROAMATINE) 10 MG tablet Take 10 mg by mouth 3 (three) times daily.     omeprazole (PRILOSEC) 20 MG capsule Take 20 mg by mouth in the morning.  8   pregabalin (LYRICA) 25 MG capsule Take 25 mg by mouth 3 (three) times daily.     Respiratory Therapy Supplies (NEBULIZER/TUBING/MOUTHPIECE) KIT 1 nebulizer kit 1 kit 0   albuterol (PROVENTIL) (2.5 MG/3ML) 0.083% nebulizer solution Take 3 mLs (2.5 mg total) by nebulization every 6 (six) hours as needed for wheezing or shortness of breath. (Patient not taking: Reported on 03/27/2022) 75 mL 0   amoxicillin (AMOXIL) 500 MG capsule Take 500 mg by mouth in the morning, at noon, in the evening, and at bedtime. (Patient not  taking: Reported on 03/27/2022)     amoxicillin-clavulanate (AUGMENTIN) 250-125 MG tablet Take by mouth. (Patient not taking: Reported on 03/27/2022)     diazepam (VALIUM) 10 MG tablet Take 10 mg by mouth daily as needed (prior to dental visits). (Patient not taking: Reported on 03/27/2022)     No current facility-administered medications for this visit.   Facility-Administered Medications Ordered in Other Visits  Medication Dose Route Frequency Provider Last Rate Last Admin   sodium chloride flush (NS) 0.9 % injection 10 mL  10 mL Intravenous PRN Nolon Stalls C, MD   10 mL at 11/22/20 1314      .  PHYSICAL EXAMINATION: ECOG PERFORMANCE STATUS: 0 - Asymptomatic  Vitals:   03/27/22 1413  BP: (!) 98/41  Pulse: 85  Resp: 20  Temp: 98.8 F (37.1 C)  SpO2: 100%   Filed Weights   03/27/22 1413  Weight: 165 lb (74.8 kg)    Physical Exam Vitals and nursing note reviewed.  Constitutional:      Comments: Alone; ambulating with a rolling  walker.      HENT:     Head: Normocephalic and atraumatic.     Mouth/Throat:     Pharynx: Oropharynx is clear.  Eyes:     Extraocular Movements: Extraocular movements intact.     Pupils: Pupils are equal, round, and reactive to light.  Cardiovascular:     Rate and Rhythm: Normal rate and regular rhythm.  Pulmonary:     Comments: Decreased breath sounds bilaterally.  Abdominal:     Palpations: Abdomen is soft.  Musculoskeletal:        General: Normal range of motion.     Cervical back: Normal range of motion.  Skin:    General: Skin is warm.     Comments: See below  Neurological:     General: No focal deficit present.     Mental Status: She is alert and oriented to person, place, and time.  Psychiatric:        Behavior: Behavior normal.        Judgment: Judgment normal.         LABORATORY DATA:  I have reviewed the data as listed Lab Results  Component Value Date   WBC 5.9 10/18/2020   HGB 15.5 (H) 10/18/2020   HCT 48.8 (H) 10/18/2020   MCV 89.2 10/18/2020   PLT 282 10/18/2020   No results for input(s): "NA", "K", "CL", "CO2", "GLUCOSE", "BUN", "CREATININE", "CALCIUM", "GFRNONAA", "GFRAA", "PROT", "ALBUMIN", "AST", "ALT", "ALKPHOS", "BILITOT", "BILIDIR", "IBILI" in the last 8760 hours.  RADIOGRAPHIC STUDIES: I have personally reviewed the radiological images as listed and agreed with the findings in the report. No results found.  ASSESSMENT & PLAN:   Encounter Diagnoses  Name Primary?   Port-A-Cath in place Yes   Cancer of gallbladder ([place])    Ductal carcinoma in situ (DCIS) of left breast    Chronic kidney disease requiring chronic dialysis ([place])    Penetrating wound of left foot, initial encounter    Chronic in nature. Encouraged port flushes Q6 weeks. Follow up in 12 months.  2-3: Encouraged her to reach out to [facility] today after this appointment to schedule.  4. Chronic in nature. Continue- complicated ABX course. BP today is stable for her given  trends and dialysis status.  5. New to me. Afebrile. Concerning for infectious without sepsis at this time but needs close monitoring which I discussed with patient. Starting on doxycyline. She will need for follow up  with her PCP or urgent care within 1 week. Discussed red flags.    All questions were answered. The patient knows to call the clinic with any problems, questions or concerns.    Hughie Closs, PA-C 03/27/2022 3:30 PM

## 2022-03-30 ENCOUNTER — Ambulatory Visit: Payer: Medicare Other | Admitting: Nurse Practitioner

## 2022-03-30 ENCOUNTER — Ambulatory Visit: Payer: Medicare Other | Admitting: Internal Medicine

## 2022-05-08 ENCOUNTER — Inpatient Hospital Stay: Payer: Medicare Other

## 2022-05-28 NOTE — Telephone Encounter (Signed)
Signing encounter, See previous message 09/20/20

## 2022-06-15 ENCOUNTER — Encounter: Payer: Self-pay | Admitting: Emergency Medicine

## 2022-06-15 ENCOUNTER — Emergency Department: Payer: Medicare Other

## 2022-06-15 ENCOUNTER — Emergency Department
Admission: EM | Admit: 2022-06-15 | Discharge: 2022-06-15 | Disposition: A | Discharge status: Home / Self Care | Payer: Medicare Other | Attending: Emergency Medicine | Admitting: Emergency Medicine

## 2022-06-15 ENCOUNTER — Other Ambulatory Visit: Payer: Self-pay

## 2022-06-15 DIAGNOSIS — R109 Unspecified abdominal pain: Secondary | ICD-10-CM | POA: Diagnosis not present

## 2022-06-15 DIAGNOSIS — H6981 Other specified disorders of Eustachian tube, right ear: Secondary | ICD-10-CM | POA: Insufficient documentation

## 2022-06-15 DIAGNOSIS — H6991 Unspecified Eustachian tube disorder, right ear: Secondary | ICD-10-CM

## 2022-06-15 DIAGNOSIS — Z1152 Encounter for screening for COVID-19: Secondary | ICD-10-CM | POA: Diagnosis not present

## 2022-06-15 DIAGNOSIS — R42 Dizziness and giddiness: Secondary | ICD-10-CM | POA: Diagnosis present

## 2022-06-15 LAB — CBC WITH DIFFERENTIAL/PLATELET
Abs Immature Granulocytes: 0.02 10*3/uL (ref 0.00–0.07)
Basophils Absolute: 0.1 10*3/uL (ref 0.0–0.1)
Basophils Relative: 1 %
Eosinophils Absolute: 0.1 10*3/uL (ref 0.0–0.5)
Eosinophils Relative: 1 %
HCT: 42.7 % (ref 36.0–46.0)
Hemoglobin: 13.2 g/dL (ref 12.0–15.0)
Immature Granulocytes: 0 %
Lymphocytes Relative: 8 %
Lymphs Abs: 0.7 10*3/uL (ref 0.7–4.0)
MCH: 24.2 pg — ABNORMAL LOW (ref 26.0–34.0)
MCHC: 30.9 g/dL (ref 30.0–36.0)
MCV: 78.2 fL — ABNORMAL LOW (ref 80.0–100.0)
Monocytes Absolute: 0.6 10*3/uL (ref 0.1–1.0)
Monocytes Relative: 7 %
Neutro Abs: 7.1 10*3/uL (ref 1.7–7.7)
Neutrophils Relative %: 83 %
Platelets: 228 10*3/uL (ref 150–400)
RBC: 5.46 MIL/uL — ABNORMAL HIGH (ref 3.87–5.11)
RDW: 20.4 % — ABNORMAL HIGH (ref 11.5–15.5)
WBC: 8.4 10*3/uL (ref 4.0–10.5)
nRBC: 0 % (ref 0.0–0.2)

## 2022-06-15 LAB — TROPONIN I (HIGH SENSITIVITY): Troponin I (High Sensitivity): 20 ng/L — ABNORMAL HIGH (ref <18)

## 2022-06-15 LAB — HEPATIC FUNCTION PANEL
ALT: 12 U/L (ref 0–44)
AST: 21 U/L (ref 15–41)
Albumin: 4 g/dL (ref 3.5–5.0)
Alkaline Phosphatase: 188 U/L — ABNORMAL HIGH (ref 38–126)
Bilirubin, Direct: 0.1 mg/dL (ref 0.0–0.2)
Indirect Bilirubin: 0.8 mg/dL (ref 0.3–0.9)
Total Bilirubin: 0.9 mg/dL (ref 0.3–1.2)
Total Protein: 7.9 g/dL (ref 6.5–8.1)

## 2022-06-15 LAB — BASIC METABOLIC PANEL
Anion gap: 18 — ABNORMAL HIGH (ref 5–15)
BUN: 43 mg/dL — ABNORMAL HIGH (ref 8–23)
CO2: 22 mmol/L (ref 22–32)
Calcium: 6.9 mg/dL — ABNORMAL LOW (ref 8.9–10.3)
Chloride: 101 mmol/L (ref 98–111)
Creatinine, Ser: 7.48 mg/dL — ABNORMAL HIGH (ref 0.44–1.00)
GFR, Estimated: 5 mL/min — ABNORMAL LOW (ref >60)
Glucose, Bld: 139 mg/dL — ABNORMAL HIGH (ref 70–99)
Potassium: 3.5 mmol/L (ref 3.5–5.1)
Sodium: 141 mmol/L (ref 135–145)

## 2022-06-15 LAB — RESP PANEL BY RT-PCR (FLU A&B, COVID) ARPGX2
Influenza A by PCR: NEGATIVE
Influenza B by PCR: NEGATIVE
SARS Coronavirus 2 by RT PCR: NEGATIVE

## 2022-06-15 LAB — LIPASE, BLOOD: Lipase: 106 U/L — ABNORMAL HIGH (ref 11–51)

## 2022-06-15 MED ORDER — DIPHENHYDRAMINE HCL 25 MG PO CAPS: 25.0000 mg | ORAL_CAPSULE | Freq: Once | ORAL | Status: DC

## 2022-06-15 MED ORDER — LORAZEPAM 1 MG PO TABS
0.5000 mg | ORAL_TABLET | Freq: Once | ORAL | Status: AC
  Administered 2022-06-15: 0.5 mg via ORAL
  Filled 2022-06-15: qty 1

## 2022-06-15 MED ORDER — FLUTICASONE PROPIONATE 50 MCG/ACT NA SUSP: 1.0000 | Freq: Two times a day (BID) | NASAL | 0 refills | Status: DC

## 2022-06-15 MED ORDER — LORAZEPAM 0.5 MG PO TABS
0.5000 mg | ORAL_TABLET | Freq: Three times a day (TID) | ORAL | 0 refills | Status: AC | PRN
Start: 2022-06-15 — End: 2023-06-15

## 2022-06-15 MED ORDER — ONDANSETRON 4 MG PO TBDP: 4.0000 mg | ORAL_TABLET | Freq: Once | ORAL | Status: DC

## 2022-06-15 MED ORDER — CETIRIZINE HCL 10 MG PO TABS: 10.0000 mg | ORAL_TABLET | Freq: Every day | ORAL | 0 refills | Status: DC

## 2022-06-15 NOTE — ED Triage Notes (Signed)
First nurse note: Arrived by EMS from home with c/o weakness and N/V. Hx vertigo. Dilaysis patient M,W,F. Did not receive dialysis today due to sickness feeling right before appointment.   Patient has port - not accessed upon arrival

## 2022-06-15 NOTE — ED Notes (Signed)
Patient has right chest port, not accessed in Triage.

## 2022-06-15 NOTE — ED Provider Notes (Signed)
[facility] Provider Note  Patient Contact: 4:32 PM (approximate)   History   Dizziness   HPI  Connie Olsen is a 76 y.o. female who presents the emergency department complaining of vertigo-like symptoms, nausea, vomiting, abdominal pain.  Patient states that this morning she got up, was getting ready for her day when she started to have some vertigo.  She does have a history of intermittent vertigo in the past but typically it resolves within a couple of minutes.  Today has been more persistent and worse than normal.  No unilateral weakness, significant headache, visual changes.  Patient denies any chest pain or shortness of breath.  Patient thought that her blood pressure or blood sugar may be getting a little low so she ate something, took some medication to increase her blood pressure and states that then she started feeling more nauseated, had some vomiting and is having some vague abdominal pain.  No fevers, chills.  No diarrhea or constipation.  No urinary changes.  Patient is a dialysis patient.  She is supposed to go Monday, Wednesday, Friday but missed today due to her symptoms.     Physical Exam   Triage Vital Signs: ED Triage Vitals [06/15/22 1143]  Enc Vitals Group     BP (!) 143/96     Pulse Rate 81     Resp 16     Temp 97.6 F (36.4 C)     Temp Source Oral     SpO2 98 %     Weight 164 lb 14.5 oz (74.8 kg)     Height 4' 11.5" (1.511 m)     Head Circumference      Peak Flow      Pain Score 0     Pain Loc      Pain Edu?      Excl. in [place]?     Most recent vital signs: Vitals:   06/15/22 1830 06/15/22 1900  BP: 121/79 (!) 110/52  Pulse: 85 (!) 47  Resp: 16 17  Temp:    SpO2: 95% 97%     General: Alert and in no acute distress. Eyes:  PERRL. EOMI. Head: No acute traumatic findings ENT:      Ears: EACs unremarkable bilaterally.  TMs bulging on the right with no injection.  TM on left is unremarkable.      Nose: No  congestion/rhinnorhea.      Mouth/Throat: Mucous membranes are moist. Neck: No stridor. No cervical spine tenderness to palpation.  Cardiovascular:  Good peripheral perfusion Respiratory: Normal respiratory effort without tachypnea or retractions. Lungs CTAB. Good air entry to the bases with no decreased or absent breath sounds. Gastrointestinal: Bowel sounds 4 quadrants. Soft and nontender to palpation. No guarding or rigidity. No palpable masses. No distention. No CVA tenderness. Musculoskeletal: Full range of motion to all extremities.  Neurologic:  No gross focal neurologic deficits are appreciated.  Cranial nerves II through XII grossly intact. Skin:   No rash noted Other:   ED Results / Procedures / Treatments   Labs (all labs ordered are listed, but only abnormal results are displayed) Labs Reviewed  CBC WITH DIFFERENTIAL/PLATELET - Abnormal; Notable for the following components:      Result Value   RBC 5.46 (*)    MCV 78.2 (*)    MCH 24.2 (*)    RDW 20.4 (*)    All other components within normal limits  BASIC METABOLIC PANEL - Abnormal; Notable for the following  components:   Glucose, Bld 139 (*)    BUN 43 (*)    Creatinine, Ser 7.48 (*)    Calcium 6.9 (*)    GFR, Estimated 5 (*)    Anion gap 18 (*)    All other components within normal limits  HEPATIC FUNCTION PANEL - Abnormal; Notable for the following components:   Alkaline Phosphatase 188 (*)    All other components within normal limits  LIPASE, BLOOD - Abnormal; Notable for the following components:   Lipase 106 (*)    All other components within normal limits  TROPONIN I (HIGH SENSITIVITY) - Abnormal; Notable for the following components:   Troponin I (High Sensitivity) 20 (*)    All other components within normal limits  RESP PANEL BY RT-PCR (FLU A&B, COVID) ARPGX2  URINALYSIS, ROUTINE W REFLEX MICROSCOPIC  TROPONIN I (HIGH SENSITIVITY)     EKG  ED ECG REPORT I, Charline Bills Breane Grunwald,  personally  viewed and interpreted this ECG.   Date: 06/15/2022  EKG Time: 146 hours  Rate: 81 bpm  Rhythm: atrial fibrillation, rate 81 bpm, prolonged QT interval, compared to previous EKG from 10/03/2020, lengthened QT interval, still in A-fib  Axis: Normal axis  Intervals: Prolonged QT interval  ST&T Change: No gross ST elevation or depression noted  Patient with A-fib.  No STEMI.  Prolonged QTc interval to 497 ms.    RADIOLOGY  I personally viewed, evaluated, and interpreted these images as part of my medical decision making, as well as reviewing the written report by the radiologist.  ED Provider Interpretation: No acute findings on CT scan of the abdomen pelvis.  Diverticulosis without diverticulitis.  Atrophic kidneys.  Patient has no concerning findings on CT scan of the head.  CT ABDOMEN PELVIS WO CONTRAST  Result Date: 06/15/2022 CLINICAL DATA:  Abdominal pain, nausea, vomiting EXAM: CT ABDOMEN AND PELVIS WITHOUT CONTRAST TECHNIQUE: Multidetector CT imaging of the abdomen and pelvis was performed following the standard protocol without IV contrast. RADIATION DOSE REDUCTION: This exam was performed according to the departmental dose-optimization program which includes automated exposure control, adjustment of the mA and/or kV according to patient size and/or use of iterative reconstruction technique. COMPARISON:  None Available. FINDINGS: Lower chest: Extensive coronary artery calcifications are seen. Tip of central venous catheter is seen in right atrium. There are no focal infiltrates in the visualized lower lung fields. Small linear densities are seen in the lower lung fields. There is fluid density in lower thoracic esophagus suggesting possible gastroesophageal reflux. Hepatobiliary: No focal abnormalities are seen in liver. Gallbladder is not seen. Prominence of extrahepatic bile ducts may be due to previous cholecystectomy. Pancreas: No focal abnormalities are seen. Spleen: Unremarkable.  Adrenals/Urinary Tract: Adrenals are unremarkable. There is atrophy of both kidneys. There is 3.8 cm cyst in the right kidney. Possible subcentimeter cyst is seen in the lower pole of left kidney. There is no hydronephrosis. Extensive calcifications are seen in the renal artery branches. No definite renal or ureteral stones are seen. Urinary bladder is not distended. Stomach/Bowel: Stomach is unremarkable. Small bowel loops are not dilated. Appendix is not seen. There is no pericecal inflammation. Multiple diverticula seen in colon. There is no evidence of focal acute diverticulitis. Vascular/Lymphatic: Extensive arterial calcifications are seen. Reproductive: Mild lobulations are seen in the margin of uterus, possibly suggesting fibroids. Coarse calcifications are noted in and adjacent to the uterus. Other: There is no ascites or pneumoperitoneum. There is ventral hernia containing fat in the epigastrium. There  is small umbilical hernia containing fat. Musculoskeletal: Degenerative changes are noted with disc space narrowing, bony spurs and facet hypertrophy in visualized lower thoracic spine and lumbar spine. Spinal stenosis is seen at L4-L5 level. There is encroachment of neural foramina at multiple levels. IMPRESSION: There is no evidence of intestinal obstruction or pneumoperitoneum. There is no hydronephrosis. Coronary artery disease. Arteriosclerosis. Gastroesophageal reflux. There is marked atrophy of both kidneys suggesting medical renal disease. Bilateral renal cysts. Diverticulosis of colon. Lumbar spondylosis. Other findings as described in the body of the report. Electronically Signed   By: Elmer Picker M.D.   On: 06/15/2022 17:21   CT Head Wo Contrast  Result Date: 06/15/2022 CLINICAL DATA:  Headache EXAM: CT HEAD WITHOUT CONTRAST TECHNIQUE: Contiguous axial images were obtained from the base of the skull through the vertex without intravenous contrast. RADIATION DOSE REDUCTION: This exam  was performed according to the departmental dose-optimization program which includes automated exposure control, adjustment of the mA and/or kV according to patient size and/or use of iterative reconstruction technique. COMPARISON:  CT brain 10/03/2020 FINDINGS: Brain: No acute territorial infarction, hemorrhage, or intracranial mass. Mild atrophy. Patchy white matter hypodensity consistent with chronic small vessel ischemic change. Small chronic left occipital infarct. Nonenlarged ventricles Vascular: No hyperdense vessels. Vertebral and carotid vascular calcification. Skull: Normal. Negative for fracture or focal lesion. Sinuses/Orbits: No acute finding. Other: None IMPRESSION: 1. No CT evidence for acute intracranial abnormality. 2. Mild atrophy and chronic small vessel ischemic changes of the white matter. Small chronic left occipital infarct Electronically Signed   By: Donavan Foil M.D.   On: 06/15/2022 17:13    PROCEDURES:  Critical Care performed: No  Procedures   MEDICATIONS ORDERED IN ED: Medications  LORazepam (ATIVAN) tablet 0.5 mg (0.5 mg Oral Given 06/15/22 1706)     IMPRESSION / MDM / ASSESSMENT AND PLAN / ED COURSE  I reviewed the triage vital signs and the nursing notes.                              Differential diagnosis includes, but is not limited to, vertigo, eustachian tube dysfunction, CVA, cholecystitis, colitis, diverticulitis   Patient's presentation is most consistent with acute presentation with potential threat to life or bodily function.   Patient's diagnosis is consistent with vertigo, eustachian tube dysfunction.  Patient presents to the ED with vertigo-like symptoms.  Has a history of same.  She states that this morning she got up and had more symptoms than typical.  Patient has recently had cataract surgery, it is altered the visual acuity in her left eye and she can no longer wear glasses as they are the wrong prescription.  Patient has been having some  depth perception issues because of this.  Patient is a dialysis patient as well.  Cranial nerves were intact.  Patient was having a room spinning sensation which would cause nausea and vomiting.  Patient had labs.  Troponin was 20 which is consistent with chronic kidney disease.  She has no chest pain.  EKG is reassuring with stable A-fib.  She does have a slightly prolonged QT which limited medications in addition to being on dialysis for her kidneys.  At this time will try allergy medications for the eustachian tube dysfunction as well as low-dose benzodiazepine as she cannot take antiemetics, first generation antihistamines due to QTc.Marland Kitchen  Patient is in agreement with this plan.  Concerning signs and symptoms are discussed to  return to the ED for.  Otherwise follow-up with primary care as needed.  Patient is given ED precautions to return to the ED for any worsening or new symptoms.        FINAL CLINICAL IMPRESSION(S) / ED DIAGNOSES   Final diagnoses:  Vertigo  Dysfunction of right eustachian tube     Rx / DC Orders   ED Discharge Orders          Ordered    fluticasone (FLONASE) 50 MCG/ACT nasal spray  2 times daily        06/15/22 1941    cetirizine (ZYRTEC) 10 MG tablet  Daily        06/15/22 1941    LORazepam (ATIVAN) 0.5 MG tablet  Every 8 hours PRN        06/15/22 1941             Note:  This document was prepared using Dragon voice recognition software and may include unintentional dictation errors.   Darletta Moll, PA-C 06/15/22 1945    Nathaniel Man, MD 06/16/22 Sharilyn Sites

## 2022-06-15 NOTE — ED Triage Notes (Signed)
C/O vertigo today, worse than usual.  STAtes started this morning when she tried to get out of bed.  Got out of bed at around [place].  Patient states c/o nausea currently.  Denies current dizziness.  STates when standing feels like more weakness in legs than normal  AAOx3.  Skin warm and dry. NAD

## 2022-06-15 NOTE — ED Notes (Signed)
Pt c/o constant dizziness and mild nausea. Pt refuses zofran at this time- states "it's not quite  bad enough for that yet." Pt is AOX4, NAD noted. Pt denies SP, SHOB.

## 2022-06-15 NOTE — ED Provider Triage Note (Signed)
  Emergency Medicine Provider Triage Evaluation Note  Connie Olsen , a 76 y.o.female,  was evaluated in triage.  Pt complains of weakness and vertigo.  She states that she is having a room spinning sensation.  She states that it is worse whenever she goes to sit up.  She does state that she has a history of this, though today is particularly worse.  Additionally states that she has been feeling weaker than usual across her whole body and just generally unwell.   Review of Systems  Positive: Weakness, vertigo Negative: Denies fever, chest pain, vomiting  Physical Exam  There were no vitals filed for this visit. Gen:   Awake, no distress   Resp:  Normal effort  MSK:   Moves extremities without difficulty  Other:  No gross neurological deficits, no facial droop, no objective weakness in upper or lower extremities.  Medical Decision Making  Given the patient's initial medical screening exam, the following diagnostic evaluation has been ordered. The patient will be placed in the appropriate treatment space, once one is available, to complete the evaluation and treatment. I have discussed the plan of care with the patient and I have advised the patient that an ED physician or mid-level practitioner will reevaluate their condition after the test results have been received, as the results may give them additional insight into the type of treatment they may need.    Diagnostics: Labs, EKG, respiratory panel.  Treatments: none immediately   Teodoro Spray, [place] 06/15/22 1143

## 2022-06-19 ENCOUNTER — Inpatient Hospital Stay: Payer: Medicare Other | Attending: Internal Medicine

## 2022-06-19 DIAGNOSIS — D0512 Intraductal carcinoma in situ of left breast: Secondary | ICD-10-CM | POA: Insufficient documentation

## 2022-06-19 DIAGNOSIS — C23 Malignant neoplasm of gallbladder: Secondary | ICD-10-CM | POA: Diagnosis not present

## 2022-06-19 DIAGNOSIS — Z95828 Presence of other vascular implants and grafts: Secondary | ICD-10-CM

## 2022-06-19 MED ORDER — SODIUM CHLORIDE 0.9% FLUSH
10.0000 mL | Freq: Once | INTRAVENOUS | Status: AC
  Administered 2022-06-19: 10 mL via INTRAVENOUS
  Filled 2022-06-19: qty 10

## 2022-06-19 MED ORDER — HEPARIN SOD (PORK) LOCK FLUSH 100 UNIT/ML IV SOLN
500.0000 [IU] | Freq: Once | INTRAVENOUS | Status: AC
  Administered 2022-06-19: 500 [IU] via INTRAVENOUS
  Filled 2022-06-19: qty 5

## 2022-07-02 ENCOUNTER — Encounter (INDEPENDENT_AMBULATORY_CARE_PROVIDER_SITE_OTHER): Payer: Self-pay

## 2022-07-31 ENCOUNTER — Inpatient Hospital Stay: Payer: Medicare Other

## 2022-08-07 ENCOUNTER — Inpatient Hospital Stay: Payer: Medicare Other | Attending: Internal Medicine

## 2022-08-07 DIAGNOSIS — Z95828 Presence of other vascular implants and grafts: Secondary | ICD-10-CM

## 2022-08-07 DIAGNOSIS — D0512 Intraductal carcinoma in situ of left breast: Secondary | ICD-10-CM | POA: Diagnosis not present

## 2022-08-07 DIAGNOSIS — C23 Malignant neoplasm of gallbladder: Secondary | ICD-10-CM | POA: Diagnosis not present

## 2022-08-07 MED ORDER — HEPARIN SOD (PORK) LOCK FLUSH 100 UNIT/ML IV SOLN
500.0000 [IU] | Freq: Once | INTRAVENOUS | Status: AC
  Administered 2022-08-07: 500 [IU] via INTRAVENOUS
  Filled 2022-08-07: qty 5

## 2022-08-07 MED ORDER — SODIUM CHLORIDE 0.9% FLUSH
10.0000 mL | Freq: Once | INTRAVENOUS | Status: AC
  Administered 2022-08-07: 10 mL via INTRAVENOUS
  Filled 2022-08-07: qty 10

## 2022-09-11 ENCOUNTER — Inpatient Hospital Stay: Payer: Medicare Other | Attending: Internal Medicine

## 2022-09-11 DIAGNOSIS — Z452 Encounter for adjustment and management of vascular access device: Secondary | ICD-10-CM | POA: Diagnosis present

## 2022-09-11 DIAGNOSIS — Z95828 Presence of other vascular implants and grafts: Secondary | ICD-10-CM

## 2022-09-11 DIAGNOSIS — D0512 Intraductal carcinoma in situ of left breast: Secondary | ICD-10-CM | POA: Insufficient documentation

## 2022-09-11 DIAGNOSIS — C23 Malignant neoplasm of gallbladder: Secondary | ICD-10-CM | POA: Insufficient documentation

## 2022-09-11 MED ORDER — SODIUM CHLORIDE 0.9% FLUSH
10.0000 mL | Freq: Once | INTRAVENOUS | Status: AC
  Administered 2022-09-11: 10 mL via INTRAVENOUS
  Filled 2022-09-11: qty 10

## 2022-09-11 MED ORDER — HEPARIN SOD (PORK) LOCK FLUSH 100 UNIT/ML IV SOLN
500.0000 [IU] | Freq: Once | INTRAVENOUS | Status: AC
  Administered 2022-09-11: 500 [IU] via INTRAVENOUS
  Filled 2022-09-11: qty 5

## 2022-10-23 ENCOUNTER — Inpatient Hospital Stay: Payer: Medicare Other | Attending: Internal Medicine

## 2022-10-23 DIAGNOSIS — D0512 Intraductal carcinoma in situ of left breast: Secondary | ICD-10-CM | POA: Diagnosis present

## 2022-10-23 DIAGNOSIS — Z8509 Personal history of malignant neoplasm of other digestive organs: Secondary | ICD-10-CM | POA: Diagnosis not present

## 2022-10-23 DIAGNOSIS — Z95828 Presence of other vascular implants and grafts: Secondary | ICD-10-CM

## 2022-10-23 MED ORDER — SODIUM CHLORIDE 0.9% FLUSH
10.0000 mL | Freq: Once | INTRAVENOUS | Status: AC
  Administered 2022-10-23: 10 mL via INTRAVENOUS
  Filled 2022-10-23: qty 10

## 2022-10-23 MED ORDER — HEPARIN SOD (PORK) LOCK FLUSH 100 UNIT/ML IV SOLN
500.0000 [IU] | Freq: Once | INTRAVENOUS | Status: AC
  Administered 2022-10-23: 500 [IU] via INTRAVENOUS
  Filled 2022-10-23: qty 5

## 2022-12-04 ENCOUNTER — Inpatient Hospital Stay: Payer: Medicare Other

## 2022-12-04 ENCOUNTER — Inpatient Hospital Stay: Payer: Medicare Other | Attending: Internal Medicine

## 2022-12-04 DIAGNOSIS — C23 Malignant neoplasm of gallbladder: Secondary | ICD-10-CM | POA: Diagnosis not present

## 2022-12-04 DIAGNOSIS — Z95828 Presence of other vascular implants and grafts: Secondary | ICD-10-CM

## 2022-12-04 DIAGNOSIS — D0512 Intraductal carcinoma in situ of left breast: Secondary | ICD-10-CM | POA: Insufficient documentation

## 2022-12-04 MED ORDER — SODIUM CHLORIDE 0.9% FLUSH
10.0000 mL | INTRAVENOUS | Status: DC | PRN
  Administered 2022-12-04: 10 mL via INTRAVENOUS
  Filled 2022-12-04: qty 10

## 2022-12-04 MED ORDER — HEPARIN SOD (PORK) LOCK FLUSH 100 UNIT/ML IV SOLN
500.0000 [IU] | Freq: Once | INTRAVENOUS | Status: AC
  Administered 2022-12-04: 500 [IU] via INTRAVENOUS
  Filled 2022-12-04: qty 5

## 2023-01-15 ENCOUNTER — Inpatient Hospital Stay: Payer: Medicare Other | Attending: Internal Medicine

## 2023-01-22 IMAGING — CR DG CHEST 2V
1 series · 2 of 2 positions shown · non-contrast
Comparison: Chest radiograph dated 04/15/2018.

CLINICAL DATA: 74-year-old female with bilateral rib pain.

EXAM:
CHEST - 2 VIEW

[Series 1: dg chest 2 view · 0.14mm/px · 2 of 2 slices shown]
[im 1/2]
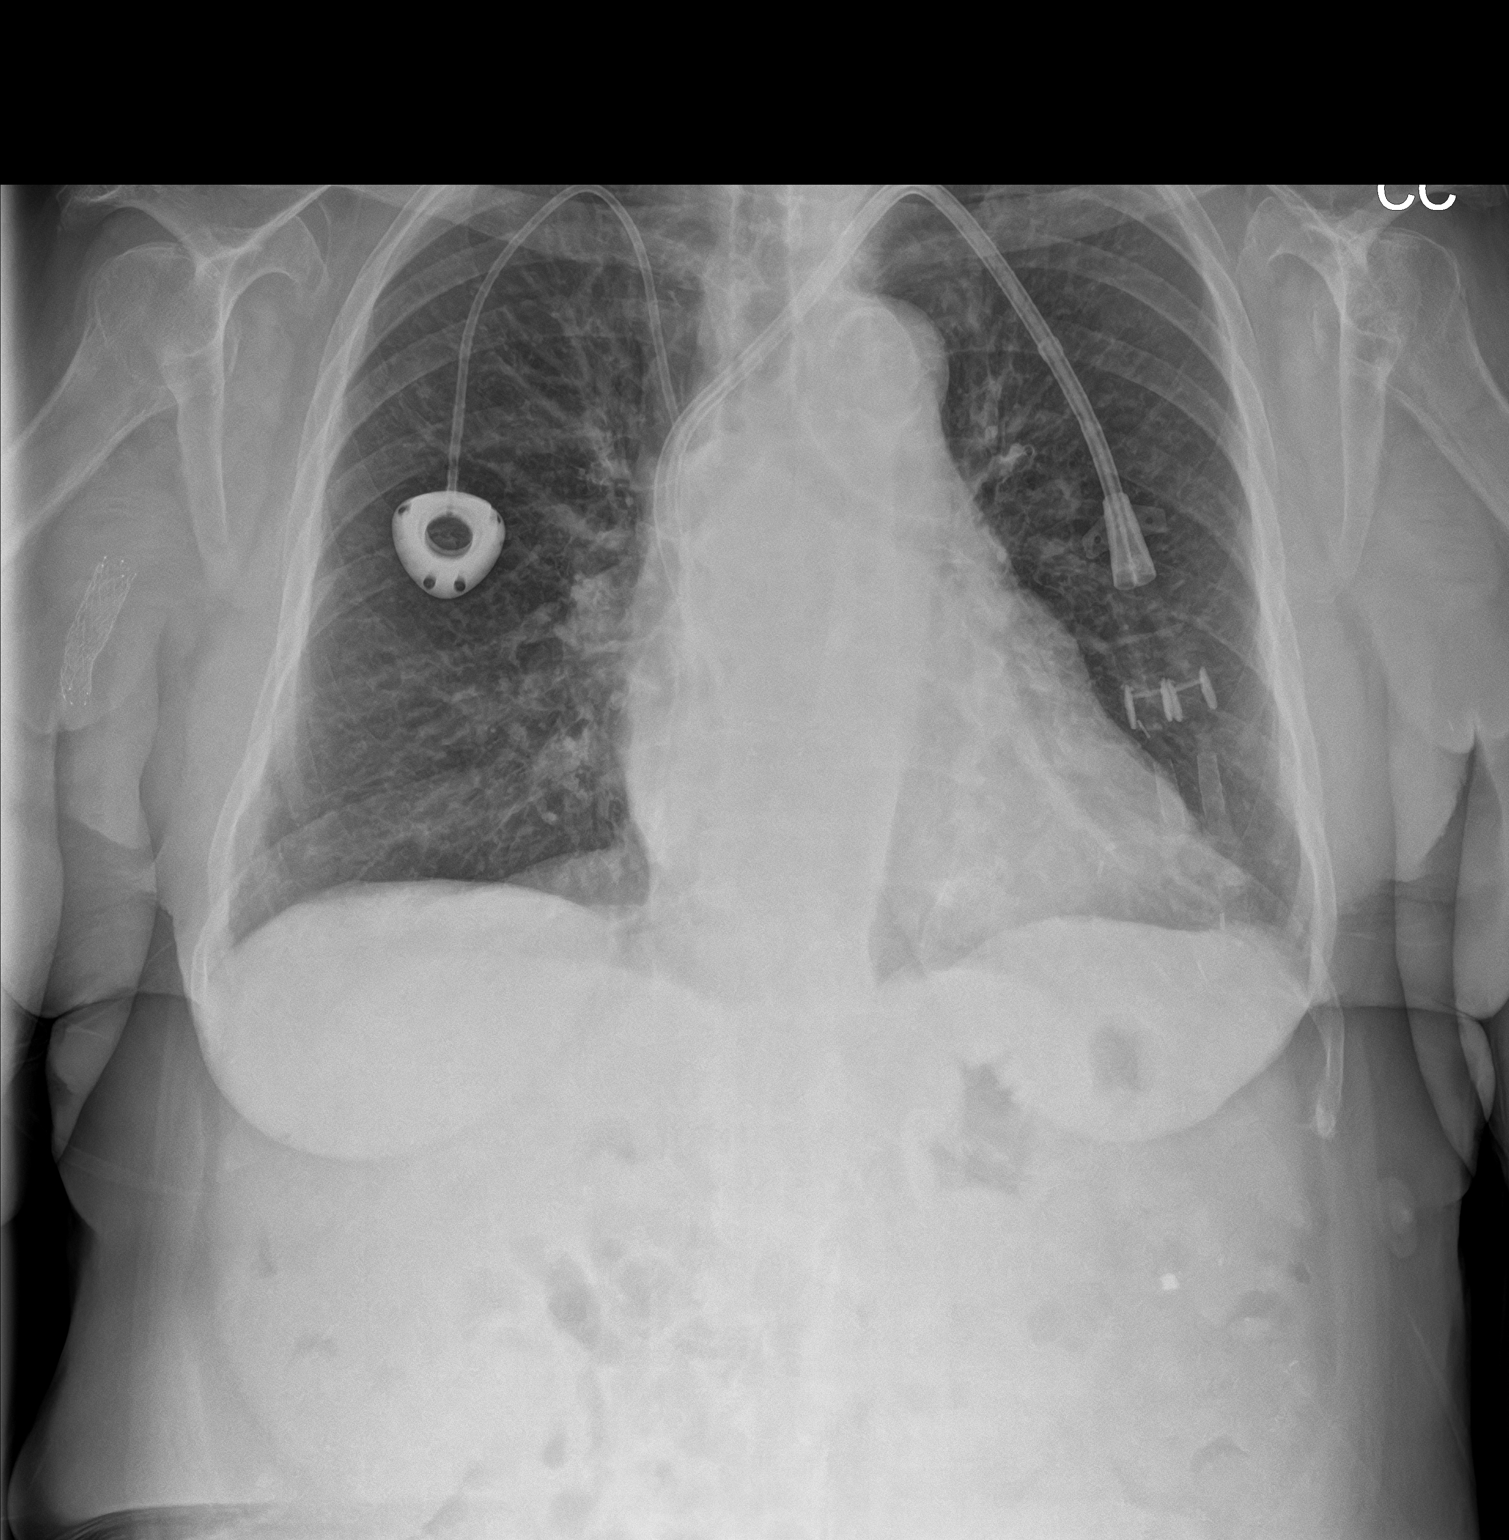
[im 2/2]
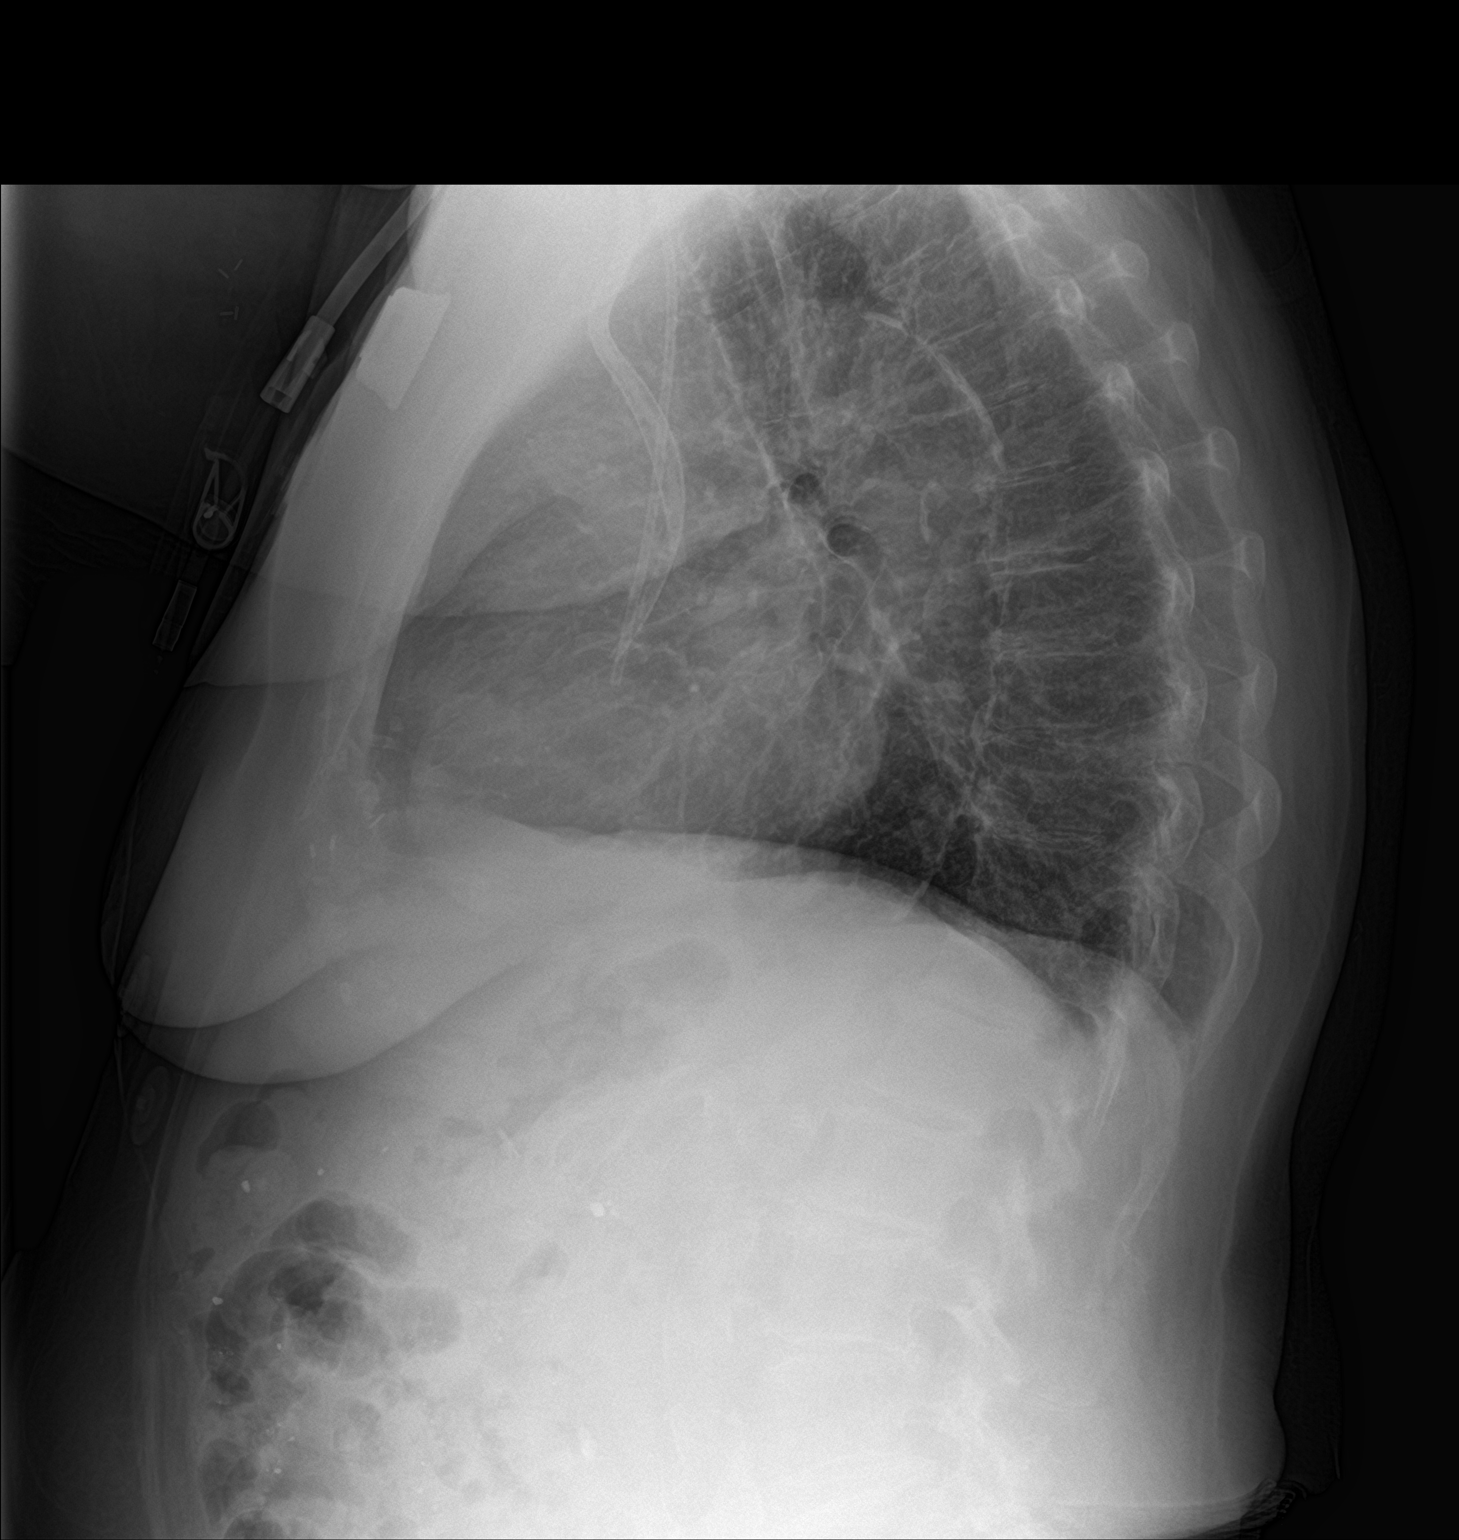

[2 of 2 positions shown; findings below may reference images not displayed]

FINDINGS: Right-sided Port-A-Cath and left dialysis catheter in similar
position. No focal consolidation, pleural effusion or pneumothorax.
Stable cardiac silhouette. Atherosclerotic calcification of the
aorta. No acute osseous pathology. Degenerative changes of spine.
Vascular stent in the right upper extremity.
IMPRESSION: No active cardiopulmonary disease.

## 2023-02-19 ENCOUNTER — Inpatient Hospital Stay: Payer: No Typology Code available for payment source | Attending: Internal Medicine

## 2023-02-19 DIAGNOSIS — C23 Malignant neoplasm of gallbladder: Secondary | ICD-10-CM | POA: Insufficient documentation

## 2023-02-19 DIAGNOSIS — Z95828 Presence of other vascular implants and grafts: Secondary | ICD-10-CM

## 2023-02-19 DIAGNOSIS — D0512 Intraductal carcinoma in situ of left breast: Secondary | ICD-10-CM | POA: Diagnosis present

## 2023-02-19 MED ORDER — HEPARIN SOD (PORK) LOCK FLUSH 100 UNIT/ML IV SOLN
500.0000 [IU] | Freq: Once | INTRAVENOUS | Status: AC
  Administered 2023-02-19: 500 [IU] via INTRAVENOUS
  Filled 2023-02-19: qty 5

## 2023-02-19 MED ORDER — SODIUM CHLORIDE 0.9% FLUSH
10.0000 mL | Freq: Once | INTRAVENOUS | Status: AC
  Administered 2023-02-19: 10 mL via INTRAVENOUS
  Filled 2023-02-19: qty 10

## 2023-02-26 ENCOUNTER — Inpatient Hospital Stay: Payer: No Typology Code available for payment source

## 2023-03-28 ENCOUNTER — Inpatient Hospital Stay: Payer: No Typology Code available for payment source

## 2023-03-28 ENCOUNTER — Inpatient Hospital Stay: Payer: No Typology Code available for payment source | Attending: Internal Medicine | Admitting: Internal Medicine

## 2023-03-28 ENCOUNTER — Other Ambulatory Visit: Payer: Self-pay | Admitting: Internal Medicine

## 2023-03-28 ENCOUNTER — Encounter: Payer: Self-pay | Admitting: Internal Medicine

## 2023-03-28 VITALS — BP 104/39 | HR 79 | Temp 96.8°F | Resp 18 | Ht 59.5 in | Wt 153.0 lb

## 2023-03-28 DIAGNOSIS — D0512 Intraductal carcinoma in situ of left breast: Secondary | ICD-10-CM | POA: Diagnosis not present

## 2023-03-28 DIAGNOSIS — N186 End stage renal disease: Secondary | ICD-10-CM | POA: Diagnosis not present

## 2023-03-28 DIAGNOSIS — Z8509 Personal history of malignant neoplasm of other digestive organs: Secondary | ICD-10-CM | POA: Insufficient documentation

## 2023-03-28 DIAGNOSIS — Z9049 Acquired absence of other specified parts of digestive tract: Secondary | ICD-10-CM | POA: Diagnosis not present

## 2023-03-28 DIAGNOSIS — Z803 Family history of malignant neoplasm of breast: Secondary | ICD-10-CM | POA: Diagnosis not present

## 2023-03-28 DIAGNOSIS — Z992 Dependence on renal dialysis: Secondary | ICD-10-CM | POA: Diagnosis not present

## 2023-03-28 DIAGNOSIS — I878 Other specified disorders of veins: Secondary | ICD-10-CM | POA: Diagnosis not present

## 2023-03-28 DIAGNOSIS — C23 Malignant neoplasm of gallbladder: Secondary | ICD-10-CM

## 2023-03-28 DIAGNOSIS — Z1231 Encounter for screening mammogram for malignant neoplasm of breast: Secondary | ICD-10-CM

## 2023-03-28 DIAGNOSIS — Z86000 Personal history of in-situ neoplasm of breast: Secondary | ICD-10-CM | POA: Diagnosis not present

## 2023-03-28 DIAGNOSIS — C50912 Malignant neoplasm of unspecified site of left female breast: Secondary | ICD-10-CM

## 2023-03-28 DIAGNOSIS — Z95828 Presence of other vascular implants and grafts: Secondary | ICD-10-CM

## 2023-03-28 MED ORDER — SODIUM CHLORIDE 0.9% FLUSH
10.0000 mL | INTRAVENOUS | Status: DC | PRN
  Administered 2023-03-28: 10 mL via INTRAVENOUS
  Filled 2023-03-28: qty 10

## 2023-03-28 MED ORDER — HEPARIN SOD (PORK) LOCK FLUSH 100 UNIT/ML IV SOLN
500.0000 [IU] | Freq: Once | INTRAVENOUS | Status: AC
  Administered 2023-03-28: 500 [IU] via INTRAVENOUS
  Filled 2023-03-28: qty 5

## 2023-03-28 NOTE — Progress Notes (Signed)
[facility] CONSULT NOTE  Patient Care Team: [place], [facility] as PCP - General Rosey Bath, MD (Inactive) as Referring Physician (Hematology and Oncology) Earna Coder, MD as Consulting Physician (Hematology and Oncology)  CHIEF COMPLAINTS/PURPOSE OF CONSULTATION: gall bladder cancer/DCIS/   #  Oncology History Overview Note  Laparoscopic cholecystectomy, 04/15/14; Radical cholecystectomy with portal LAD, segment 4B/5 partial hepatectomy, 05/27/14.  found to have a gallbladder mass on CT that was followed by an MRI that raised concern for gallbladder cancer as well as a bile duct stricture followed by EUS and ERCP that revealed no further pathology. She was taken to the OR 8/13 for a lap chole with the intent that should frozen pathology demonstrate concern for cancer, a more radical resection would be done. However, at the time of surgery, though frozen section was negative for cancer, final pathology demonstrated a T2N0 well differentiated adenocarcinoma, margins negative.  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 Othella Boyer       HISTORY OF PRESENTING ILLNESS: Patient ambulating-in wheel chair  Alone.  Connie Olsen 77 y.o.  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  Patient states that she has not had a mammogram for at least 2 years or more.  Denies any new lumps or bumps.  Otherwise denies any unusual chest pain or shortness of breath or cough.  Denies abdominal pain nausea vomiting or weight loss.  I  Review of Systems   Constitutional:  Positive for malaise/fatigue. Negative for chills, diaphoresis and fever.  HENT:  Negative for nosebleeds and sore throat.   Eyes:  Negative for double vision.  Respiratory:  Negative for cough, hemoptysis, sputum production, shortness of breath and wheezing.   Cardiovascular:  Negative for chest pain, palpitations, orthopnea and leg swelling.  Gastrointestinal:  Negative for abdominal pain, blood in stool, constipation, diarrhea, heartburn, melena, nausea and vomiting.  Genitourinary:  Negative for dysuria, frequency and urgency.  Musculoskeletal:  Positive for back pain. Negative for joint pain.  Skin: Negative.  Negative for itching and rash.  Neurological:  Negative for dizziness, tingling, focal weakness, weakness and headaches.  Endo/Heme/Allergies:  Does not bruise/bleed easily.  Psychiatric/Behavioral:  Negative for depression. The patient is not nervous/anxious and does not have insomnia.      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   Endometrial thickening on ultra sound 04/19/2015   History of ulcerative colitis    History of uterine fibroid    Osteoporosis     SURGICAL HISTORY: Past Surgical History:  Procedure Laterality Date   CHOLECYSTECTOMY     colonoscopy  05/08/2016   LAPAROSCOPIC PARTIAL HEPATECTOMY     MASTECTOMY PARTIAL / LUMPECTOMY     PORTA CATH INSERTION N/A 06/06/2021   Procedure: PORTA CATH INSERTION;  Surgeon: Renford Dills, MD;  Location: [facility] INVASIVE CV LAB;  Service: Cardiovascular;  Laterality: N/A;   UPPER GI ENDOSCOPY  05/08/2016  SOCIAL HISTORY: Social History   Socioeconomic History   Marital status: Legally Separated    Spouse name: Not on file   Number of children: Not on file   Years of education: Not on file   Highest education level: Not on file   Occupational History   Not on file  Tobacco Use   Smoking status: Never   Smokeless tobacco: Never  Vaping Use   Vaping status: Never Used  Substance and Sexual Activity   Alcohol use: No    Alcohol/week: 0.0 standard drinks of alcohol   Drug use: No   Sexual activity: Not on file  Other Topics Concern   Not on file  Social History Narrative   Not on file   Social Determinants of Health   Financial Resource Strain: Low Risk  (12/26/2022)   Received from [facility], [facility]   Overall Financial Resource Strain (CARDIA)    Difficulty of Paying Living Expenses: Not hard at all  Food Insecurity: No Food Insecurity (12/26/2022)   Received from [facility], [facility]   Hunger Vital Sign    Worried About Running Out of Food in the Last Year: Never true    Ran Out of Food in the Last Year: Never true  Transportation Needs: No Transportation Needs (12/26/2022)   Received from [facility], [facility]   [facility] - Transportation    Lack of Transportation (Medical): No    Lack of Transportation (Non-Medical): No  Physical Activity: Not on file  Stress: Not on file  Social Connections: Not on file  Intimate Partner Violence: Not on file    FAMILY HISTORY: Family History  Problem Relation Age of Onset   Breast cancer Mother     ALLERGIES:  is allergic to aspirin, brassica napus seed oil, naltrexone, librax  [chlordiazepoxide-clidinium], povidone-iodine, and tape.  MEDICATIONS:  Current Outpatient Medications  Medication Sig Dispense Refill   aspirin EC 81 MG tablet Take 81 mg by mouth at bedtime.     B Complex-C-Folic Acid (DIALYVITE TABLET) TABS Take 1 tablet by mouth at bedtime.     cetirizine (ZYRTEC) 10 MG tablet Take 1 tablet (10 mg total) by mouth daily. 30 tablet 0   cinacalcet (SENSIPAR) 90 MG tablet Take 90 mg by mouth See admin instructions. Take 90 mg by mouth daily with largest meal of the day.  11   ferric citrate (AURYXIA) 1 GM 210  MG(Fe) tablet Take 420 mg by mouth 3 (three) times daily with meals.     fluticasone (FLONASE) 50 MCG/ACT nasal spray Place 1 spray into both nostrils 2 (two) times daily. 16 g 0   guaiFENesin-codeine (CHERATUSSIN AC) 100-10 MG/5ML syrup Take 5 mLs by mouth 4 (four) times daily as needed for cough. 118 mL 0   ibuprofen (ADVIL) 200 MG tablet Take 200-400 mg by mouth every 6 (six) hours as needed for moderate pain.     lidocaine-prilocaine (EMLA) cream Apply 1 application topically daily as needed (prior to port use).     LORazepam (ATIVAN) 0.5 MG tablet Take 1 tablet (0.5 mg total) by mouth every 8 (eight) hours as needed (Vertigo). 60 tablet 0   metoprolol tartrate (LOPRESSOR) 25 MG tablet Take 12.5 mg by mouth 2 (two) times daily.  1   midodrine (PROAMATINE) 10 MG tablet Take 10 mg by mouth 3 (three) times daily.     omeprazole (PRILOSEC) 20 MG capsule Take 20 mg by mouth in the morning.  8   pregabalin (LYRICA) 25 MG capsule Take 25 mg by mouth 3 (three) times daily.     Respiratory Therapy Supplies (NEBULIZER/TUBING/MOUTHPIECE) KIT 1 nebulizer kit 1 kit 0   albuterol (PROVENTIL) (2.5 MG/3ML) 0.083% nebulizer solution Take 3 mLs (2.5 mg total) by nebulization every 6 (six) hours as needed for wheezing or shortness of breath. (Patient not taking: Reported on 03/27/2022) 75 mL 0   amoxicillin (AMOXIL) 500 MG capsule Take 500 mg by mouth in the morning, at noon, in the evening, and at bedtime. (Patient not taking: Reported on 03/27/2022)     amoxicillin-clavulanate (AUGMENTIN) 250-125 MG tablet Take by mouth. (Patient not taking: Reported on 03/27/2022)     diazepam (VALIUM) 10 MG tablet Take 10 mg by mouth daily as needed (prior to dental visits). (Patient not taking: Reported on 03/27/2022)     No current facility-administered medications for this visit.   Facility-Administered Medications Ordered in Other Visits  Medication Dose Route Frequency Provider Last Rate Last Admin   sodium chloride flush  (NS) 0.9 % injection 10 mL  10 mL Intravenous PRN Corcoran, Melissa C, MD   10 mL at 11/22/20 1314   sodium chloride flush (NS) 0.9 % injection 10 mL  10 mL Intravenous PRN Earna Coder, MD   10 mL at 03/28/23 0912      .  PHYSICAL EXAMINATION: ECOG PERFORMANCE STATUS: 0 - Asymptomatic  Vitals:   03/28/23 0921  BP: (!) 104/39  Pulse: 79  Resp: 18  Temp: (!) 96.8 F (36 C)  SpO2: 99%   Filed Weights   03/28/23 0921  Weight: 153 lb (69.4 kg)    Physical Exam Vitals and nursing note reviewed.  Constitutional:      Comments:      HENT:     Head: Normocephalic and atraumatic.     Mouth/Throat:     Pharynx: Oropharynx is clear.  Eyes:     Extraocular Movements: Extraocular movements intact.     Pupils: Pupils are equal, round, and reactive to light.  Cardiovascular:     Rate and Rhythm: Normal rate and regular rhythm.  Pulmonary:     Comments: Decreased breath sounds bilaterally.  Abdominal:     Palpations: Abdomen is soft.  Musculoskeletal:        General: Normal range of motion.     Cervical back: Normal range of motion.  Skin:    General: Skin is warm.  Neurological:     General: No focal deficit present.     Mental Status: She is alert and oriented to person, place, and time.  Psychiatric:        Behavior: Behavior normal.        Judgment: Judgment normal.      LABORATORY DATA:  I have reviewed the data as listed Lab Results  Component Value Date   WBC 8.4 06/15/2022   HGB 13.2 06/15/2022   HCT 42.7 06/15/2022   MCV 78.2 (L) 06/15/2022   PLT 228 06/15/2022   Recent Labs    06/15/22 1601  NA 141  K 3.5  CL 101  CO2 22  GLUCOSE 139*  BUN 43*  CREATININE 7.48*  CALCIUM 6.9*  GFRNONAA 5*  PROT 7.9  ALBUMIN 4.0  AST 21  ALT 12  ALKPHOS 188*  BILITOT 0.9  BILIDIR 0.1  IBILI 0.8    RADIOGRAPHIC STUDIES: I have personally reviewed the radiological images as listed and agreed with the findings in the report. No results  found.  ASSESSMENT & PLAN:   Cancer of gallbladder [facility]) # Gallbladder cancer s/p laparoscopic cholecystectomy and liver wedge biopsy by Dr Kathrynn Ducking at [facility] on 04/15/2014.  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  Tumor invaded perimuscular connective tissue with no extension beyond the serosa or into the liver.  Margins were uninvolved (closest 0.5 mm).  There is no lymphovascular or perineural invasion.  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  One lymph node was negative. Pathologic stage was pT2 pN0.  # Last CT scan OCT 2021- NEGATIVE for any recurrence. No further surveillance imaging is recommended.     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  DCIS was ER and PR positive.  She declined further surgery or radiation.  She remains on Armidex.  She is followed by Dr. Roland Earl at [facility].  Diagnostic bilateral mammogram on 12/31/2019 revealed no evidence of malignancy.  recommend compliance with Bilateral mammograms.  Patient wants to proceed with mammograms locally.  Ordered today.  # ESRD- on permacath [M/W/F]- [facility]*; [place].   # # Multiple graft failure-unclear etiology: Continue aspirin 325 mg at this time.    # Poor IV access- has it flushed every 6 weeks. No concerns for any malfunction  Transportation- # DISPOSITION: #  Bilateral screening mammograms  # port flush every 6-8 weeks  # Follow up with MD- in 12 months; port flush; No labs-- Dr.B.   All questions were answered. The patient knows to call the clinic with any problems, questions or concerns.     Earna Coder, MD 03/28/2023 10:20 AM

## 2023-03-28 NOTE — Progress Notes (Signed)
Patient wants to know how often she has to have cat scans and labs done.

## 2023-03-28 NOTE — Assessment & Plan Note (Deleted)
#   Multiple graft failure-unclear etiology.  Patient had antiphospholipid antibody work [lupus anticoagulant positive/ beta-2 IgM glycoprotein >1502 March 2018 & October 2018.].  Since patient was previously evaluated with [facility] I have reminded the patient to follow-up with [facility] again.  Continue aspirin 325 mg at this time.  Patient is agreeable.  Written instructions given.  # Poor IV access- has it flushed every 6 weeks. No concerns for any malfunction.  # gall bladder ca stage II.  S/p surgery- follows with Dr.Zani; [facility];; stable.  # DCIS- follows up with Dr.Westbrook, [facility].  Stable.  # ESRD- on permacath [M/W/F][facility] [place]; [place]; multiple gaft/fistula failure- see discussion above.   # port flush every 6 weeks/ follow up with MD in 12 months.   Cc; [facility]; Dr.Metjian; hematology.

## 2023-03-28 NOTE — Addendum Note (Signed)
Addended by: Darrold Span A on: 03/28/2023 10:40 AM   Modules accepted: Orders

## 2023-03-28 NOTE — Assessment & Plan Note (Addendum)
#   Gallbladder cancer s/p laparoscopic cholecystectomy and liver wedge biopsy by Dr Kathrynn Ducking at [facility] on 04/15/2014.  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  Tumor invaded perimuscular connective tissue with no extension beyond the serosa or into the liver.  Margins were uninvolved (closest 0.5 mm).  There is no lymphovascular or perineural invasion.  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  One lymph node was negative. Pathologic stage was pT2 pN0.  # Last CT scan OCT 2021- NEGATIVE for any recurrence. No further surveillance imaging is recommended.     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  DCIS was ER and PR positive.  She declined further surgery or radiation.  She remains on Armidex.  She is followed by Dr. Roland Earl at [facility].  Diagnostic bilateral mammogram on 12/31/2019 revealed no evidence of malignancy.  recommend compliance with Bilateral mammograms.  Patient wants to proceed with mammograms locally.  Ordered today.  # ESRD- on permacath [M/W/F]- [facility]*; [place].   # # Multiple graft failure-unclear etiology: Continue aspirin 325 mg at this time.    # Poor IV access- has it flushed every 6 weeks. No concerns for any malfunction  Transportation- # DISPOSITION: #  Bilateral screening mammograms  # port flush every 6-8 weeks  # Follow up with MD- in 12 months; port flush; No labs-- Dr.B.

## 2023-04-09 ENCOUNTER — Ambulatory Visit
Admission: RE | Admit: 2023-04-09 | Discharge: 2023-04-09 | Disposition: A | Discharge status: Home / Self Care | Payer: Medicare Other | Source: Ambulatory Visit | Attending: Internal Medicine | Admitting: Internal Medicine

## 2023-04-09 ENCOUNTER — Other Ambulatory Visit (INDEPENDENT_AMBULATORY_CARE_PROVIDER_SITE_OTHER): Payer: Self-pay | Admitting: Nurse Practitioner

## 2023-04-09 ENCOUNTER — Inpatient Hospital Stay: Payer: Medicare Other

## 2023-04-09 DIAGNOSIS — L97909 Non-pressure chronic ulcer of unspecified part of unspecified lower leg with unspecified severity: Secondary | ICD-10-CM

## 2023-04-09 DIAGNOSIS — Z1231 Encounter for screening mammogram for malignant neoplasm of breast: Secondary | ICD-10-CM | POA: Insufficient documentation

## 2023-04-11 ENCOUNTER — Ambulatory Visit
Admission: RE | Admit: 2023-04-11 | Discharge: 2023-04-11 | Disposition: A | Discharge status: Home / Self Care | Payer: Self-pay | Source: Ambulatory Visit | Attending: Internal Medicine | Admitting: Internal Medicine

## 2023-04-11 ENCOUNTER — Ambulatory Visit (INDEPENDENT_AMBULATORY_CARE_PROVIDER_SITE_OTHER): Payer: Medicare Other

## 2023-04-11 ENCOUNTER — Ambulatory Visit (INDEPENDENT_AMBULATORY_CARE_PROVIDER_SITE_OTHER): Payer: BLUE CROSS/BLUE SHIELD | Admitting: Vascular Surgery

## 2023-04-11 ENCOUNTER — Other Ambulatory Visit: Payer: Self-pay | Admitting: *Deleted

## 2023-04-11 VITALS — BP 119/72 | HR 68

## 2023-04-11 DIAGNOSIS — Z1231 Encounter for screening mammogram for malignant neoplasm of breast: Secondary | ICD-10-CM

## 2023-04-11 DIAGNOSIS — I7025 Atherosclerosis of native arteries of other extremities with ulceration: Secondary | ICD-10-CM

## 2023-04-11 DIAGNOSIS — I10 Essential (primary) hypertension: Secondary | ICD-10-CM

## 2023-04-11 DIAGNOSIS — E1129 Type 2 diabetes mellitus with other diabetic kidney complication: Secondary | ICD-10-CM

## 2023-04-11 DIAGNOSIS — L97909 Non-pressure chronic ulcer of unspecified part of unspecified lower leg with unspecified severity: Secondary | ICD-10-CM

## 2023-04-11 DIAGNOSIS — N186 End stage renal disease: Secondary | ICD-10-CM

## 2023-04-11 DIAGNOSIS — I4891 Unspecified atrial fibrillation: Secondary | ICD-10-CM

## 2023-04-11 LAB — VAS US ABI WITH/WO TBI

## 2023-04-11 NOTE — Progress Notes (Signed)
MRN : [id number]  Connie Olsen is a 77 y.o. ([birth year]) female who presents with chief complaint of check circulation.  History of Present Illness:   Patient returns to the office for follow-up regarding revision of her port.  Procedure performed June 06, 2021: Revision of right IJ Infuse-a-Port with replacement of a new port same venous access in association with an angioplasty of the right innominate vein to 8 mm.   Today, the patient denies any pain there have been no fever or chills.  No redness or drainage from the port sites.  However as a secondary issue the patient has had numerous ulcers particularly of the right lower extremity.  They started as blisters and drained quite a bit.  She has had several infections now.  They have been using a variety of different dressings and she is being followed at the wound center.  Concern regarding arterial insufficiency has been raised.  ABIs obtained today are reviewed with the patient and show Rt=[place] (TBI=0.27) monophasic signals and Lt=[place] (TBI=0.27) monophasic signals  No outpatient medications have been marked as taking for the 04/11/23 encounter (Office Visit) with Renford Dills, MD.    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   Endometrial thickening on ultra sound 04/19/2015   History of ulcerative colitis    History of uterine fibroid    Osteoporosis     Past Surgical History:  Procedure Laterality Date   CHOLECYSTECTOMY     colonoscopy  05/08/2016   LAPAROSCOPIC PARTIAL HEPATECTOMY     MASTECTOMY PARTIAL / LUMPECTOMY     PORTA CATH INSERTION N/A 06/06/2021   Procedure: PORTA CATH INSERTION;  Surgeon: Renford Dills, MD;  Location: [facility] INVASIVE CV LAB;  Service: Cardiovascular;  Laterality: N/A;    UPPER GI ENDOSCOPY  05/08/2016    Social History Social History   Tobacco Use   Smoking status: Never   Smokeless tobacco: Never  Vaping Use   Vaping status: Never Used  Substance Use Topics   Alcohol use: No    Alcohol/week: 0.0 standard drinks of alcohol   Drug use: No    Family History Family History  Problem Relation Age of Onset   Breast cancer Mother     Allergies  Allergen Reactions   Aspirin Other (See Comments)    Ulcerative colitis: UNABLE TO TOLERATE IF NOT ENTERIC COATED   Brassica Napus Seed Oil Other (See Comments)   Naltrexone Other (See Comments)   Librax  [Chlordiazepoxide-Clidinium] Anxiety    agitation   Povidone-Iodine Itching and Rash    Only problem if it is topical, tolerates injected contrast   Tape Itching     REVIEW OF SYSTEMS (Negative unless checked)  Constitutional: [] Weight loss  [] Fever  [] Chills Cardiac: [] Chest pain   [] Chest pressure   [] Palpitations   [] Shortness of breath when laying flat   [] Shortness of breath with exertion. Vascular:  [x] Pain in legs with walking   []   Pain in legs at rest  [] History of DVT   [] Phlebitis   [] Swelling in legs   [] Varicose veins   [] Non-healing ulcers Pulmonary:   [] Uses home oxygen   [] Productive cough   [] Hemoptysis   [] Wheeze  [] COPD   [] Asthma Neurologic:  [] Dizziness   [] Seizures   [] History of stroke   [] History of TIA  [] Aphasia   [] Vissual changes   [] Weakness or numbness in arm   [] Weakness or numbness in leg Musculoskeletal:   [] Joint swelling   [] Joint pain   [] Low back pain Hematologic:  [] Easy bruising  [] Easy bleeding   [] Hypercoagulable state   [] Anemic Gastrointestinal:  [] Diarrhea   [] Vomiting  [] Gastroesophageal reflux/heartburn   [] Difficulty swallowing. Genitourinary:  [] Chronic kidney disease   [] Difficult urination  [] Frequent urination   [] Blood in urine Skin:  [x] Rashes   [x] Ulcers  Psychological:  [] History of anxiety   []  History of major depression.  Physical  Examination  There were no vitals filed for this visit. There is no height or weight on file to calculate BMI. Gen: WD/WN, NAD Head: [place]/AT, No temporalis wasting.  Ear/Nose/Throat: Hearing grossly intact, nares w/o erythema or drainage Eyes: PER, EOMI, sclera nonicteric.  Neck: Supple, no masses.  No bruit or JVD.  Pulmonary:  Good air movement, no audible wheezing, no use of accessory muscles.  Cardiac: RRR, normal S1, S2, no Murmurs. Vascular:  mild trophic changes, multiple right leg open wounds.  Left IJ catheter clean dry and intact right port clean dry and intact Vessel Right Left  Radial Palpable Palpable  DP Not Palpable Not Palpable  PT Not Palpable Not Palpable  Gastrointestinal: soft, non-distended. No guarding/no peritoneal signs.  Musculoskeletal: M/S 5/5 throughout.  No visible deformity.  Neurologic: CN 2-12 intact. Pain and light touch intact in extremities.  Symmetrical.  Speech is fluent. Motor exam as listed above. Psychiatric: Judgment intact, Mood & affect appropriate for pt's clinical situation. Dermatologic: No rashes or ulcers noted.  No changes consistent with cellulitis.   CBC Lab Results  Component Value Date   WBC 8.4 06/15/2022   HGB 13.2 06/15/2022   HCT 42.7 06/15/2022   MCV 78.2 (L) 06/15/2022   PLT 228 06/15/2022    BMET    Component Value Date/Time   NA 141 06/15/2022 1601   K 3.5 06/15/2022 1601   CL 101 06/15/2022 1601   CO2 22 06/15/2022 1601   GLUCOSE 139 (H) 06/15/2022 1601   BUN 43 (H) 06/15/2022 1601   CREATININE 7.48 (H) 06/15/2022 1601   CALCIUM 6.9 (L) 06/15/2022 1601   GFRNONAA 5 (L) 06/15/2022 1601   CrCl cannot be calculated (Patient's most recent lab result is older than the maximum 21 days allowed.).  COAG No results found for: "INR", "PROTIME"  Radiology MM Outside Films Mammo  Result Date: 04/11/2023 This examination belongs to an outside facility and is stored here for comparison purposes only.  Contact the  originating outside institution for any associated report or interpretation.  MM Outside Films Mammo  Result Date: 04/11/2023 This examination belongs to an outside facility and is stored here for comparison purposes only.  Contact the originating outside institution for any associated report or interpretation.  MM Outside Films Mammo  Result Date: 04/11/2023 This examination belongs to an outside facility and is stored here for comparison purposes only.  Contact the originating outside institution for any associated report or interpretation.  MM Outside Films Mammo  Result Date: 04/11/2023 This examination belongs to an outside  facility and is stored here for comparison purposes only.  Contact the originating outside institution for any associated report or interpretation.  MM Outside Films Mammo  Result Date: 04/11/2023 This examination belongs to an outside facility and is stored here for comparison purposes only.  Contact the originating outside institution for any associated report or interpretation.    Assessment/Plan 1. Atherosclerosis of native arteries of the extremities with ulceration (HCC)  Recommend:  The patient has evidence of severe atherosclerotic changes of both lower extremities associated with ulceration and tissue loss of the right lower.  This represents a limb threatening ischemia and places the patient at the risk for right lower extremity limb loss.  Patient should undergo angiography of the right lower extremity with the hope for intervention for limb salvage.  The risks and benefits as well as the alternative therapies was discussed in detail with the patient.  All questions were answered.  Patient agrees to proceed with right lower extremity angiography.  The patient will follow up with me in the office after the procedure.   2. Atrial fibrillation, unspecified type (HCC) Continue antiarrhythmia medications as already ordered, these medications have been reviewed  and there are no changes at this time.  Continue anticoagulation as ordered by Cardiology Service  3. Primary hypertension Continue antihypertensive medications as already ordered, these medications have been reviewed and there are no changes at this time.  4. Type 2 diabetes mellitus with other diabetic kidney complication (HCC) Continue hypoglycemic medications as already ordered, these medications have been reviewed and there are no changes at this time.  Hgb A1C to be monitored as already arranged by primary service  5. End stage renal disease (HCC) Recommend:  The patient is doing well and currently has adequate dialysis access.  She has had multiple failed upper extremity access and giving the difficulty she is having with her lower extremity she is refusing lower extremity access.  At this point she has determined that she is catheter dependent and is unwilling to have further evaluation for an extremity access upper or lower.  The patient's dialysis center is not reporting any access issues. Flow pattern is stable when compared to the prior ultrasound.  The patient should have a duplex ultrasound of the dialysis access in 6 months. The patient will follow-up with me in the office after each ultrasound       [name], MD  04/11/2023 11:18 AM

## 2023-04-11 NOTE — H&P (View-Only) (Signed)
 MRN : [id number]  Connie Olsen is a 77 y.o. ([birth year]) female who presents with chief complaint of check circulation.  History of Present Illness:   Patient returns to the office for follow-up regarding revision of her port.  Procedure performed June 06, 2021: Revision of right IJ Infuse-a-Port with replacement of a new port same venous access in association with an angioplasty of the right innominate vein to 8 mm.   Today, the patient denies any pain there have been no fever or chills.  No redness or drainage from the port sites.  However as a secondary issue the patient has had numerous ulcers particularly of the right lower extremity.  They started as blisters and drained quite a bit.  She has had several infections now.  They have been using a variety of different dressings and she is being followed at the wound center.  Concern regarding arterial insufficiency has been raised.  ABIs obtained today are reviewed with the patient and show Rt=[place] (TBI=0.27) monophasic signals and Lt=[place] (TBI=0.27) monophasic signals  No outpatient medications have been marked as taking for the 04/11/23 encounter (Office Visit) with Renford Dills, MD.    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   Endometrial thickening on ultra sound 04/19/2015   History of ulcerative colitis    History of uterine fibroid    Osteoporosis     Past Surgical History:  Procedure Laterality Date   CHOLECYSTECTOMY     colonoscopy  05/08/2016   LAPAROSCOPIC PARTIAL HEPATECTOMY     MASTECTOMY PARTIAL / LUMPECTOMY     PORTA CATH INSERTION N/A 06/06/2021   Procedure: PORTA CATH INSERTION;  Surgeon: Renford Dills, MD;  Location: [facility];  Service: Cardiovascular;  Laterality: N/A;    UPPER GI ENDOSCOPY  05/08/2016    Social History Social History   Tobacco Use   Smoking status: Never   Smokeless tobacco: Never  Vaping Use   Vaping status: Never Used  Substance Use Topics   Alcohol use: No    Alcohol/week: 0.0 standard drinks of alcohol   Drug use: No    Family History Family History  Problem Relation Age of Onset   Breast cancer Mother     Allergies  Allergen Reactions   Aspirin Other (See Comments)    Ulcerative colitis: UNABLE TO TOLERATE IF NOT ENTERIC COATED   Brassica Napus Seed Oil Other (See Comments)   Naltrexone Other (See Comments)   Librax  [Chlordiazepoxide-Clidinium] Anxiety    agitation   Povidone-Iodine Itching and Rash    Only problem if it is topical, tolerates injected contrast   Tape Itching     REVIEW OF SYSTEMS (Negative unless checked)  Constitutional: [] Weight loss  [] Fever  [] Chills Cardiac: [] Chest pain   [] Chest pressure   [] Palpitations   [] Shortness of breath when laying flat   [] Shortness of breath with exertion. Vascular:  [x] Pain in legs with walking   []   Pain in legs at rest  [] History of DVT   [] Phlebitis   [] Swelling in legs   [] Varicose veins   [] Non-healing ulcers Pulmonary:   [] Uses home oxygen   [] Productive cough   [] Hemoptysis   [] Wheeze  [] COPD   [] Asthma Neurologic:  [] Dizziness   [] Seizures   [] History of stroke   [] History of TIA  [] Aphasia   [] Vissual changes   [] Weakness or numbness in arm   [] Weakness or numbness in leg Musculoskeletal:   [] Joint swelling   [] Joint pain   [] Low back pain Hematologic:  [] Easy bruising  [] Easy bleeding   [] Hypercoagulable state   [] Anemic Gastrointestinal:  [] Diarrhea   [] Vomiting  [] Gastroesophageal reflux/heartburn   [] Difficulty swallowing. Genitourinary:  [] Chronic kidney disease   [] Difficult urination  [] Frequent urination   [] Blood in urine Skin:  [x] Rashes   [x] Ulcers  Psychological:  [] History of anxiety   []  History of major depression.  Physical  Examination  There were no vitals filed for this visit. There is no height or weight on file to calculate BMI. Gen: WD/WN, NAD Head: [place]/AT, No temporalis wasting.  Ear/Nose/Throat: Hearing grossly intact, nares w/o erythema or drainage Eyes: PER, EOMI, sclera nonicteric.  Neck: Supple, no masses.  No bruit or JVD.  Pulmonary:  Good air movement, no audible wheezing, no use of accessory muscles.  Cardiac: RRR, normal S1, S2, no Murmurs. Vascular:  mild trophic changes, multiple right leg open wounds.  Left IJ catheter clean dry and intact right port clean dry and intact Vessel Right Left  Radial Palpable Palpable  DP Not Palpable Not Palpable  PT Not Palpable Not Palpable  Gastrointestinal: soft, non-distended. No guarding/no peritoneal signs.  Musculoskeletal: M/S 5/5 throughout.  No visible deformity.  Neurologic: CN 2-12 intact. Pain and light touch intact in extremities.  Symmetrical.  Speech is fluent. Motor exam as listed above. Psychiatric: Judgment intact, Mood & affect appropriate for pt's clinical situation. Dermatologic: No rashes or ulcers noted.  No changes consistent with cellulitis.   CBC Lab Results  Component Value Date   WBC 8.4 06/15/2022   HGB 13.2 06/15/2022   HCT 42.7 06/15/2022   MCV 78.2 (L) 06/15/2022   PLT 228 06/15/2022    BMET    Component Value Date/Time   NA 141 06/15/2022 1601   K 3.5 06/15/2022 1601   CL 101 06/15/2022 1601   CO2 22 06/15/2022 1601   GLUCOSE 139 (H) 06/15/2022 1601   BUN 43 (H) 06/15/2022 1601   CREATININE 7.48 (H) 06/15/2022 1601   CALCIUM 6.9 (L) 06/15/2022 1601   GFRNONAA 5 (L) 06/15/2022 1601   CrCl cannot be calculated (Patient's most recent lab result is older than the maximum 21 days allowed.).  COAG No results found for: "INR", "PROTIME"  Radiology MM Outside Films Mammo  Result Date: 04/11/2023 This examination belongs to an outside facility and is stored here for comparison purposes only.  Contact the  originating outside institution for any associated report or interpretation.  MM Outside Films Mammo  Result Date: 04/11/2023 This examination belongs to an outside facility and is stored here for comparison purposes only.  Contact the originating outside institution for any associated report or interpretation.  MM Outside Films Mammo  Result Date: 04/11/2023 This examination belongs to an outside facility and is stored here for comparison purposes only.  Contact the originating outside institution for any associated report or interpretation.  MM Outside Films Mammo  Result Date: 04/11/2023 This examination belongs to an outside  facility and is stored here for comparison purposes only.  Contact the originating outside institution for any associated report or interpretation.  MM Outside Films Mammo  Result Date: 04/11/2023 This examination belongs to an outside facility and is stored here for comparison purposes only.  Contact the originating outside institution for any associated report or interpretation.    Assessment/Plan 1. Atherosclerosis of native arteries of the extremities with ulceration (HCC)  Recommend:  The patient has evidence of severe atherosclerotic changes of both lower extremities associated with ulceration and tissue loss of the right lower.  This represents a limb threatening ischemia and places the patient at the risk for right lower extremity limb loss.  Patient should undergo angiography of the right lower extremity with the hope for intervention for limb salvage.  The risks and benefits as well as the alternative therapies was discussed in detail with the patient.  All questions were answered.  Patient agrees to proceed with right lower extremity angiography.  The patient will follow up with me in the office after the procedure.   2. Atrial fibrillation, unspecified type (HCC) Continue antiarrhythmia medications as already ordered, these medications have been reviewed  and there are no changes at this time.  Continue anticoagulation as ordered by Cardiology Service  3. Primary hypertension Continue antihypertensive medications as already ordered, these medications have been reviewed and there are no changes at this time.  4. Type 2 diabetes mellitus with other diabetic kidney complication (HCC) Continue hypoglycemic medications as already ordered, these medications have been reviewed and there are no changes at this time.  Hgb A1C to be monitored as already arranged by primary service  5. End stage renal disease (HCC) Recommend:  The patient is doing well and currently has adequate dialysis access.  She has had multiple failed upper extremity access and giving the difficulty she is having with her lower extremity she is refusing lower extremity access.  At this point she has determined that she is catheter dependent and is unwilling to have further evaluation for an extremity access upper or lower.  The patient's dialysis center is not reporting any access issues. Flow pattern is stable when compared to the prior ultrasound.  The patient should have a duplex ultrasound of the dialysis access in 6 months. The patient will follow-up with me in the office after each ultrasound       [name], MD  04/11/2023 11:18 AM

## 2023-04-12 ENCOUNTER — Encounter (INDEPENDENT_AMBULATORY_CARE_PROVIDER_SITE_OTHER): Payer: Self-pay | Admitting: Vascular Surgery

## 2023-04-12 DIAGNOSIS — I7025 Atherosclerosis of native arteries of other extremities with ulceration: Secondary | ICD-10-CM | POA: Insufficient documentation

## 2023-04-12 DIAGNOSIS — I739 Peripheral vascular disease, unspecified: Secondary | ICD-10-CM | POA: Insufficient documentation

## 2023-04-17 ENCOUNTER — Telehealth (INDEPENDENT_AMBULATORY_CARE_PROVIDER_SITE_OTHER): Payer: Self-pay

## 2023-04-17 NOTE — Telephone Encounter (Signed)
I attempted to contact the patient to schedule her for a right leg angio with Dr. Gilda Crease. A message was left for a return call.

## 2023-04-18 NOTE — Telephone Encounter (Signed)
Patient returned my call and is scheduled with Dr. Gilda Crease on 04/23/23 with a 8:30 am arrival time to the [facility] for a right leg angio w/anesthesia. Pre-procedure instructions were discussed and will be sent to Mychart.

## 2023-04-23 ENCOUNTER — Encounter
Admission: RE | Disposition: A | Discharge status: Home / Self Care | Payer: Self-pay | Source: Home / Self Care | Attending: Vascular Surgery

## 2023-04-23 ENCOUNTER — Encounter: Payer: Self-pay | Admitting: Vascular Surgery

## 2023-04-23 ENCOUNTER — Ambulatory Visit
Admission: RE | Admit: 2023-04-23 | Discharge: 2023-04-23 | Disposition: A | Discharge status: Home / Self Care | Payer: Medicare Other | Source: Home / Self Care | Attending: Vascular Surgery | Admitting: Vascular Surgery

## 2023-04-23 DIAGNOSIS — I4891 Unspecified atrial fibrillation: Secondary | ICD-10-CM | POA: Diagnosis not present

## 2023-04-23 DIAGNOSIS — T829XXA Unspecified complication of cardiac and vascular prosthetic device, implant and graft, initial encounter: Secondary | ICD-10-CM

## 2023-04-23 DIAGNOSIS — I120 Hypertensive chronic kidney disease with stage 5 chronic kidney disease or end stage renal disease: Secondary | ICD-10-CM | POA: Insufficient documentation

## 2023-04-23 DIAGNOSIS — E1151 Type 2 diabetes mellitus with diabetic peripheral angiopathy without gangrene: Secondary | ICD-10-CM | POA: Insufficient documentation

## 2023-04-23 DIAGNOSIS — N186 End stage renal disease: Secondary | ICD-10-CM | POA: Insufficient documentation

## 2023-04-23 DIAGNOSIS — L97519 Non-pressure chronic ulcer of other part of right foot with unspecified severity: Secondary | ICD-10-CM | POA: Diagnosis not present

## 2023-04-23 DIAGNOSIS — L97909 Non-pressure chronic ulcer of unspecified part of unspecified lower leg with unspecified severity: Secondary | ICD-10-CM

## 2023-04-23 DIAGNOSIS — E11621 Type 2 diabetes mellitus with foot ulcer: Secondary | ICD-10-CM | POA: Diagnosis not present

## 2023-04-23 DIAGNOSIS — Z7901 Long term (current) use of anticoagulants: Secondary | ICD-10-CM | POA: Diagnosis not present

## 2023-04-23 DIAGNOSIS — E1122 Type 2 diabetes mellitus with diabetic chronic kidney disease: Secondary | ICD-10-CM | POA: Diagnosis not present

## 2023-04-23 DIAGNOSIS — I70235 Atherosclerosis of native arteries of right leg with ulceration of other part of foot: Secondary | ICD-10-CM | POA: Diagnosis not present

## 2023-04-23 HISTORY — PX: LOWER EXTREMITY ANGIOGRAPHY: CATH118251

## 2023-04-23 LAB — POTASSIUM (ARMC VASCULAR LAB ONLY): Potassium (ARMC vascular lab): 2.6 mmol/L — CL (ref 3.5–5.1)

## 2023-04-23 SURGERY — LOWER EXTREMITY ANGIOGRAPHY
Anesthesia: Moderate Sedation | Site: Leg Lower | Laterality: Right

## 2023-04-23 MED ORDER — POTASSIUM CHLORIDE CRYS ER 20 MEQ PO TBCR
20.0000 meq | EXTENDED_RELEASE_TABLET | Freq: Once | ORAL | Status: AC
  Administered 2023-04-23: 20 meq via ORAL

## 2023-04-23 MED ORDER — OXYCODONE HCL 5 MG PO TABS: 5.0000 mg | ORAL_TABLET | ORAL | Status: DC | PRN

## 2023-04-23 MED ORDER — CLOPIDOGREL BISULFATE 75 MG PO TABS
75.0000 mg | ORAL_TABLET | Freq: Every day | ORAL | 5 refills | Status: DC
Start: 2023-04-24 — End: 2023-09-24

## 2023-04-23 MED ORDER — SODIUM CHLORIDE 0.9 % IV SOLN: 250.0000 mL | INTRAVENOUS | Status: DC | PRN

## 2023-04-23 MED ORDER — LABETALOL HCL 5 MG/ML IV SOLN: 10.0000 mg | INTRAVENOUS | Status: DC | PRN

## 2023-04-23 MED ORDER — FENTANYL CITRATE (PF) 100 MCG/2ML IJ SOLN
INTRAMUSCULAR | Status: AC
  Filled 2023-04-23: qty 2

## 2023-04-23 MED ORDER — PANTOPRAZOLE SODIUM 40 MG PO TBEC: 40.0000 mg | DELAYED_RELEASE_TABLET | Freq: Every day | ORAL | 11 refills | Status: AC

## 2023-04-23 MED ORDER — CLOPIDOGREL BISULFATE 300 MG PO TABS
300.0000 mg | ORAL_TABLET | ORAL | Status: AC
  Administered 2023-04-23: 300 mg via ORAL

## 2023-04-23 MED ORDER — MORPHINE SULFATE (PF) 2 MG/ML IV SOLN
INTRAVENOUS | Status: AC
  Filled 2023-04-23: qty 1

## 2023-04-23 MED ORDER — HEPARIN SOD (PORK) LOCK FLUSH 100 UNIT/ML IV SOLN
INTRAVENOUS | Status: AC
  Filled 2023-04-23: qty 5

## 2023-04-23 MED ORDER — HEPARIN SODIUM (PORCINE) 1000 UNIT/ML IJ SOLN
INTRAMUSCULAR | Status: AC
  Filled 2023-04-23: qty 10

## 2023-04-23 MED ORDER — LIDOCAINE HCL (PF) 1 % IJ SOLN
INTRAMUSCULAR | Status: DC | PRN
  Administered 2023-04-23: 10 mL via INTRADERMAL

## 2023-04-23 MED ORDER — MIDAZOLAM HCL 5 MG/5ML IJ SOLN
INTRAMUSCULAR | Status: AC
  Filled 2023-04-23: qty 5

## 2023-04-23 MED ORDER — HEPARIN (PORCINE) IN NACL 2000-0.9 UNIT/L-% IV SOLN
INTRAVENOUS | Status: DC | PRN
  Administered 2023-04-23: 1000 mL

## 2023-04-23 MED ORDER — CEFAZOLIN SODIUM-DEXTROSE 1-4 GM/50ML-% IV SOLN
INTRAVENOUS | Status: AC
  Filled 2023-04-23: qty 50

## 2023-04-23 MED ORDER — ONDANSETRON HCL 4 MG/2ML IJ SOLN: 4.0000 mg | Freq: Four times a day (QID) | INTRAMUSCULAR | Status: DC | PRN

## 2023-04-23 MED ORDER — MORPHINE SULFATE (PF) 4 MG/ML IV SOLN: 2.0000 mg | INTRAVENOUS | Status: DC | PRN

## 2023-04-23 MED ORDER — SODIUM CHLORIDE 0.9 % IV SOLN
INTRAVENOUS | Status: DC
  Administered 2023-04-23: 1000 mL via INTRAVENOUS

## 2023-04-23 MED ORDER — MORPHINE SULFATE (PF) 2 MG/ML IV SOLN
2.0000 mg | INTRAVENOUS | Status: DC | PRN
  Administered 2023-04-23: 2 mg via INTRAVENOUS

## 2023-04-23 MED ORDER — SODIUM CHLORIDE 0.9% FLUSH: 3.0000 mL | INTRAVENOUS | Status: DC | PRN

## 2023-04-23 MED ORDER — POTASSIUM CHLORIDE CRYS ER 20 MEQ PO TBCR
EXTENDED_RELEASE_TABLET | ORAL | Status: AC
  Filled 2023-04-23: qty 2

## 2023-04-23 MED ORDER — HEPARIN SODIUM (PORCINE) 1000 UNIT/ML IJ SOLN
INTRAMUSCULAR | Status: DC | PRN
  Administered 2023-04-23: 6000 [IU] via INTRAVENOUS

## 2023-04-23 MED ORDER — CEFAZOLIN SODIUM-DEXTROSE 1-4 GM/50ML-% IV SOLN
1.0000 g | INTRAVENOUS | Status: AC
  Administered 2023-04-23: 1 g via INTRAVENOUS

## 2023-04-23 MED ORDER — MIDAZOLAM HCL 2 MG/2ML IJ SOLN
INTRAMUSCULAR | Status: DC | PRN
  Administered 2023-04-23: .5 mg via INTRAVENOUS
  Administered 2023-04-23: 2 mg via INTRAVENOUS
  Administered 2023-04-23: .5 mg via INTRAVENOUS

## 2023-04-23 MED ORDER — SODIUM CHLORIDE 0.9% FLUSH: 3.0000 mL | Freq: Two times a day (BID) | INTRAVENOUS | Status: DC

## 2023-04-23 MED ORDER — IODIXANOL 320 MG/ML IV SOLN
INTRAVENOUS | Status: DC | PRN
  Administered 2023-04-23: 60 mL via INTRA_ARTERIAL

## 2023-04-23 MED ORDER — FENTANYL CITRATE (PF) 100 MCG/2ML IJ SOLN
INTRAMUSCULAR | Status: DC | PRN
  Administered 2023-04-23 (×2): 12.5 ug via INTRAVENOUS
  Administered 2023-04-23: 50 ug via INTRAVENOUS

## 2023-04-23 MED ORDER — CLOPIDOGREL BISULFATE 75 MG PO TABS
ORAL_TABLET | ORAL | Status: AC
  Filled 2023-04-23: qty 4

## 2023-04-23 MED ORDER — FAMOTIDINE 20 MG PO TABS: 40.0000 mg | ORAL_TABLET | Freq: Once | ORAL | Status: DC | PRN

## 2023-04-23 MED ORDER — DIPHENHYDRAMINE HCL 50 MG/ML IJ SOLN: 50.0000 mg | Freq: Once | INTRAMUSCULAR | Status: DC | PRN

## 2023-04-23 MED ORDER — ACETAMINOPHEN 325 MG PO TABS: 650.0000 mg | ORAL_TABLET | ORAL | Status: DC | PRN

## 2023-04-23 MED ORDER — HYDROMORPHONE HCL 1 MG/ML IJ SOLN: 1.0000 mg | Freq: Once | INTRAMUSCULAR | Status: DC | PRN

## 2023-04-23 MED ORDER — MIDAZOLAM HCL 2 MG/ML PO SYRP: 8.0000 mg | ORAL_SOLUTION | Freq: Once | ORAL | Status: DC | PRN

## 2023-04-23 MED ORDER — HYDRALAZINE HCL 20 MG/ML IJ SOLN: 5.0000 mg | INTRAMUSCULAR | Status: DC | PRN

## 2023-04-23 MED ORDER — METHYLPREDNISOLONE SODIUM SUCC 125 MG IJ SOLR: 125.0000 mg | Freq: Once | INTRAMUSCULAR | Status: DC | PRN

## 2023-04-23 SURGICAL SUPPLY — 27 items
BALLN LUTONIX DCB 5X80X130 (BALLOONS) ×1
BALLN ULTRSCOR 014 2.5X100X150 (BALLOONS) ×1
BALLOON LUTONIX DCB 5X80X130 (BALLOONS) IMPLANT
BALLOON ULTRSC 014 2.5X100X150 (BALLOONS) IMPLANT
CATH ANGIO 5F PIGTAIL 65CM (CATHETERS) IMPLANT
CATH SEEKER .035X135CM (CATHETERS) IMPLANT
CATH TEMPO 5F RIM 65CM (CATHETERS) IMPLANT
CATH VERT 5FR 125CM (CATHETERS) IMPLANT
COVER PROBE ULTRASOUND 5X96 (MISCELLANEOUS) IMPLANT
DEVICE STARCLOSE SE CLOSURE (Vascular Products) IMPLANT
DEVICE TORQUE (MISCELLANEOUS) IMPLANT
GLIDEWIRE ADV .035X260CM (WIRE) IMPLANT
GOWN STRL REUS W/ TWL LRG LVL3 (GOWN DISPOSABLE) ×1 IMPLANT
GOWN STRL REUS W/TWL LRG LVL3 (GOWN DISPOSABLE) ×1
KIT ENCORE 26 ADVANTAGE (KITS) IMPLANT
NDL ENTRY 21GA 7CM ECHOTIP (NEEDLE) IMPLANT
NEEDLE ENTRY 21GA 7CM ECHOTIP (NEEDLE) ×1
PACK ANGIOGRAPHY (CUSTOM PROCEDURE TRAY) ×1 IMPLANT
SET INTRO CAPELLA COAXIAL (SET/KITS/TRAYS/PACK) IMPLANT
SHEATH BRITE TIP 5FRX11 (SHEATH) IMPLANT
SHEATH RAABE 6FR (SHEATH) IMPLANT
STENT LIFESTENT 5F 6X80X135 (Permanent Stent) IMPLANT
SYR MEDRAD MARK 7 150ML (SYRINGE) IMPLANT
TUBING CONTRAST HIGH PRESS 72 (TUBING) IMPLANT
WIRE G V18X300CM (WIRE) IMPLANT
WIRE GUIDERIGHT .035X150 (WIRE) IMPLANT
WIRE RUNTHROUGH .014X300CM (WIRE) IMPLANT

## 2023-04-23 NOTE — Interval H&P Note (Signed)
History and Physical Interval Note:  04/23/2023 10:24 AM  Connie Olsen  has presented today for surgery, with the diagnosis of RLE w possible intervention   ASO w ulceration.  The various methods of treatment have been discussed with the patient and family. After consideration of risks, benefits and other options for treatment, the patient has consented to  Procedure(s): Lower Extremity Angiography (Right) as a surgical intervention.  The patient's history has been reviewed, patient examined, no change in status, stable for surgery.  I have reviewed the patient's chart and labs.  Questions were answered to the patient's satisfaction.     [name]

## 2023-04-23 NOTE — Op Note (Signed)
[facility]  Percutaneous Study/Intervention Procedural Note   Date of Surgery: 04/23/2023  Surgeon:  Renford Dills, MD.  Pre-operative Diagnosis: Atherosclerotic occlusive disease bilateral lower extremities with ulceration of the right foot  Post-operative diagnosis:  Same  Procedure(s) Performed:             1.  Introduction catheter into right  lower extremity 3rd order catheter placement              2.    Contrast injection right lower extremity for distal runoff             3.  Percutaneous transluminal angioplasty and stent placement right superficial femoral artery and popliteal             4.  Percutaneous transluminal angioplasty right anterior tibial artery.               5.  Star close closure left common femoral arteriotomy  Anesthesia: Conscious sedation was administered under my direct supervision by the interventional radiology RN. IV Versed plus fentanyl were utilized. Continuous ECG, pulse oximetry and blood pressure was monitored throughout the entire procedure.  Conscious sedation was for a total of 54 minutes.  Sheath: 6 [place] Rabie left common femoral retrograde  Contrast: 60 cc  Fluoroscopy Time: 8.4 minutes  Indications:  Edsel Petrin presents with ulcerations of the right foot which are nonhealing.  Physical examination as well as noninvasive studies demonstrate significant atherosclerotic occlusive disease.  This places her at increased risk for limb loss.  Angiography with hope for intervention is recommended for limb salvage.  The risks and benefits are reviewed all questions answered patient agrees to proceed.  Procedure:  Connie Olsen is a 77 y.o. y.o. female who was identified and appropriate procedural time out was performed.  The patient was then placed supine on the table and prepped and draped in the usual sterile fashion.    Ultrasound was placed in the sterile sleeve and the left groin was evaluated the  left common femoral artery was echolucent and pulsatile indicating patency.  Image was recorded for the permanent record and under real-time visualization a microneedle was inserted into the common femoral artery microwire followed by a micro-sheath.  A J-wire was then advanced through the micro-sheath and a  5 [place] sheath was then inserted over a J-wire. J-wire was then advanced and a 5 French pigtail catheter was positioned at the level of T12. AP projection of the aorta was then obtained. Pigtail catheter was repositioned to above the bifurcation and a LAO view of the pelvis was obtained.  Subsequently a rim catheter with the stiff angle Glidewire was used to cross the aortic bifurcation the catheter wire were advanced down into the right distal external iliac artery. Oblique view of the femoral bifurcation was then obtained and subsequently the wire was reintroduced and the pigtail catheter negotiated into the SFA representing third order catheter placement. Distal runoff was then performed.  6000 units of heparin was then given and allowed to circulate and a 6 [place] Rabie sheath was advanced up and over the bifurcation and positioned in the femoral artery  KMP  catheter and stiff angle Glidewire were then negotiated down into the distal popliteal.  Reimaging of the SFA and popliteal was then completed by hand injection through the sheath.  A 6 mm x 80 mm life stent was deployed and subsequently postdilated with a 5 mm x 80 mm Lutonix drug-eluting balloon.  Imaging then demonstrated excellent apposition to the wall of the SFA and popliteal with less than 10% residual stenosis.  Distal runoff was then reassessed.  Using the advantage wire and a Kumpe catheter the wire and catheter were negotiated into the anterior tibial.  Anterior tibial occlusion was then crossed.  A seeker catheter was advanced over the 035 wire and the advantage wire exchanged for a 0.014 run-through wire.  A 2.5 mm x 100 mm ultra  score balloon was then advanced 2 separate inflations were required to cover the length of the lesion.  Both inflations were to 10 atm for 1 minute.  Follow-up imaging demonstrated wide patency with less than 10% residual stenosis throughout the entire anterior tibial.  After review of these images the sheath is pulled into the left external iliac oblique of the common femoral is obtained and a Star close device deployed. There no immediate complications.   Findings:  The abdominal aorta is opacified with a bolus injection contrast. Renal arteries are nonvisualized consistent with her end-stage renal disease. The aorta itself has diffuse disease but no hemodynamically significant lesions. The common and external iliac arteries are widely patent bilaterally.  The right common femoral is widely patent as is the profunda femoris.  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  The distal popliteal demonstrates diffuse disease as does the trifurcation.  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  Tibioperoneal trunk is patent as is the peroneal.  The anterior tibial demonstrates segmental occlusions in its midportion.  Following angioplasty the right anterior tibial now is in-line flow and looks quite nice with less than 10% residual stenosis. Angioplasty and stent placement of the right SFA at Hunter's canal yields an excellent result with less than 10% residual stenosis.    Summary: Successful recanalization right lower extremity for limb salvage                        Disposition: Patient was taken to the recovery room in stable condition having tolerated the procedure well.  Salil Raineri, Latina Craver 04/23/2023,11:37 AM

## 2023-04-23 NOTE — Discharge Instructions (Signed)

## 2023-05-09 ENCOUNTER — Inpatient Hospital Stay: Payer: Medicare Other

## 2023-05-15 ENCOUNTER — Other Ambulatory Visit (INDEPENDENT_AMBULATORY_CARE_PROVIDER_SITE_OTHER): Payer: Self-pay | Admitting: Vascular Surgery

## 2023-05-15 DIAGNOSIS — Z9889 Other specified postprocedural states: Secondary | ICD-10-CM

## 2023-05-16 ENCOUNTER — Encounter (INDEPENDENT_AMBULATORY_CARE_PROVIDER_SITE_OTHER): Payer: Medicare Other

## 2023-05-16 ENCOUNTER — Ambulatory Visit (INDEPENDENT_AMBULATORY_CARE_PROVIDER_SITE_OTHER): Payer: Medicare Other | Admitting: Vascular Surgery

## 2023-05-21 ENCOUNTER — Inpatient Hospital Stay: Payer: Medicare Other

## 2023-05-21 ENCOUNTER — Encounter (INDEPENDENT_AMBULATORY_CARE_PROVIDER_SITE_OTHER): Payer: Medicare Other

## 2023-05-23 ENCOUNTER — Ambulatory Visit (INDEPENDENT_AMBULATORY_CARE_PROVIDER_SITE_OTHER): Payer: Medicare Other | Admitting: Vascular Surgery

## 2023-05-30 ENCOUNTER — Ambulatory Visit (INDEPENDENT_AMBULATORY_CARE_PROVIDER_SITE_OTHER): Payer: Medicare Other

## 2023-05-30 DIAGNOSIS — I739 Peripheral vascular disease, unspecified: Secondary | ICD-10-CM | POA: Diagnosis not present

## 2023-05-30 DIAGNOSIS — Z9889 Other specified postprocedural states: Secondary | ICD-10-CM

## 2023-06-03 LAB — VAS US ABI WITH/WO TBI

## 2023-06-04 ENCOUNTER — Inpatient Hospital Stay: Payer: Medicare Other | Attending: Internal Medicine

## 2023-06-11 ENCOUNTER — Ambulatory Visit (INDEPENDENT_AMBULATORY_CARE_PROVIDER_SITE_OTHER): Payer: Medicare Other | Admitting: Nurse Practitioner

## 2023-06-11 ENCOUNTER — Encounter (INDEPENDENT_AMBULATORY_CARE_PROVIDER_SITE_OTHER): Payer: Self-pay | Admitting: Nurse Practitioner

## 2023-06-11 VITALS — BP 137/61 | HR 71 | Resp 18 | Ht 59.0 in | Wt 153.0 lb

## 2023-06-11 DIAGNOSIS — I10 Essential (primary) hypertension: Secondary | ICD-10-CM

## 2023-06-11 DIAGNOSIS — I7025 Atherosclerosis of native arteries of other extremities with ulceration: Secondary | ICD-10-CM | POA: Diagnosis not present

## 2023-06-11 DIAGNOSIS — E1129 Type 2 diabetes mellitus with other diabetic kidney complication: Secondary | ICD-10-CM

## 2023-06-11 NOTE — Progress Notes (Signed)
Subjective:    Patient ID: Connie Olsen, female    DOB: 1946-08-29, 77 y.o.   MRN: [id number] Chief Complaint  Patient presents with   Follow-up    3 week follow up with ABI    The patient returns to the office for followup and review status post angiogram with intervention on 04/23/2023.   Procedure: Procedure(s) Performed:             1.  Introduction catheter into right  lower extremity 3rd order catheter placement              2.    Contrast injection right lower extremity for distal runoff             3.  Percutaneous transluminal angioplasty and stent placement right superficial femoral artery and popliteal             4.  Percutaneous transluminal angioplasty right anterior tibial artery.               5.  Star close closure left common femoral arteriotomy   The patient notes improvement in the lower extremity symptoms. No interval shortening of the patient's claudication distance or rest pain symptoms. No new ulcers or wounds have occurred since the last visit.  The previous ulcers and wounds have very nearly healed at this time since the angiogram.  There have been no significant changes to the patient's overall health care.  No documented history of amaurosis fugax or recent TIA symptoms. There are no recent neurological changes noted. No documented history of DVT, PE or superficial thrombophlebitis. The patient denies recent episodes of angina or shortness of breath.   ABIs are noncompressible bilaterally which is consistent with previous studies but there is in the TBI's bilaterally to 0.84.  The right lower extremity has triphasic/biphasic tibial waveforms with biphasic waveforms on the left lower extremity.    Review of Systems  Cardiovascular:  Positive for leg swelling.  Musculoskeletal:  Positive for gait problem.  Skin:  Positive for wound.  All other systems reviewed and are negative.      Objective:   Physical Exam Vitals reviewed.  HENT:      Head: Normocephalic.  Cardiovascular:     Rate and Rhythm: Normal rate.     Pulses:          Dorsalis pedis pulses are detected w/ Doppler on the right side and detected w/ Doppler on the left side.       Posterior tibial pulses are detected w/ Doppler on the right side and detected w/ Doppler on the left side.  Pulmonary:     Effort: Pulmonary effort is normal.  Skin:    General: Skin is warm and dry.  Neurological:     Mental Status: She is alert and oriented to person, place, and time.  Psychiatric:        Mood and Affect: Mood normal.        Behavior: Behavior normal.        Thought Content: Thought content normal.        Judgment: Judgment normal.     BP 137/61 (BP Location: Right Wrist)   Pulse 71   Resp 18   Ht 4\' 11"  (1.499 m)   Wt 153 lb (69.4 kg)   BMI 30.90 kg/m   Past Medical History:  Diagnosis Date   Barrett's esophagus without dysplasia    Cholangiocarcinoma (HCC)    Chronic kidney disease    DCIS (  Subjective:    Patient ID: Connie Olsen, female    DOB: 1946-08-29, 77 y.o.   MRN: [id number] Chief Complaint  Patient presents with   Follow-up    3 week follow up with ABI    The patient returns to the office for followup and review status post angiogram with intervention on 04/23/2023.   Procedure: Procedure(s) Performed:             1.  Introduction catheter into right  lower extremity 3rd order catheter placement              2.    Contrast injection right lower extremity for distal runoff             3.  Percutaneous transluminal angioplasty and stent placement right superficial femoral artery and popliteal             4.  Percutaneous transluminal angioplasty right anterior tibial artery.               5.  Star close closure left common femoral arteriotomy   The patient notes improvement in the lower extremity symptoms. No interval shortening of the patient's claudication distance or rest pain symptoms. No new ulcers or wounds have occurred since the last visit.  The previous ulcers and wounds have very nearly healed at this time since the angiogram.  There have been no significant changes to the patient's overall health care.  No documented history of amaurosis fugax or recent TIA symptoms. There are no recent neurological changes noted. No documented history of DVT, PE or superficial thrombophlebitis. The patient denies recent episodes of angina or shortness of breath.   ABIs are noncompressible bilaterally which is consistent with previous studies but there is in the TBI's bilaterally to 0.84.  The right lower extremity has triphasic/biphasic tibial waveforms with biphasic waveforms on the left lower extremity.    Review of Systems  Cardiovascular:  Positive for leg swelling.  Musculoskeletal:  Positive for gait problem.  Skin:  Positive for wound.  All other systems reviewed and are negative.      Objective:   Physical Exam Vitals reviewed.  HENT:      Head: Normocephalic.  Cardiovascular:     Rate and Rhythm: Normal rate.     Pulses:          Dorsalis pedis pulses are detected w/ Doppler on the right side and detected w/ Doppler on the left side.       Posterior tibial pulses are detected w/ Doppler on the right side and detected w/ Doppler on the left side.  Pulmonary:     Effort: Pulmonary effort is normal.  Skin:    General: Skin is warm and dry.  Neurological:     Mental Status: She is alert and oriented to person, place, and time.  Psychiatric:        Mood and Affect: Mood normal.        Behavior: Behavior normal.        Thought Content: Thought content normal.        Judgment: Judgment normal.     BP 137/61 (BP Location: Right Wrist)   Pulse 71   Resp 18   Ht 4\' 11"  (1.499 m)   Wt 153 lb (69.4 kg)   BMI 30.90 kg/m   Past Medical History:  Diagnosis Date   Barrett's esophagus without dysplasia    Cholangiocarcinoma (HCC)    Chronic kidney disease    DCIS (  Component Value Date/Time   NA 141 06/15/2022 1601   K 3.5 06/15/2022 1601   CL 101 06/15/2022 1601   CO2 22 06/15/2022 1601   GLUCOSE 139 (H) 06/15/2022 1601   BUN 43 (H) 06/15/2022 1601   CREATININE 7.48 (H) 06/15/2022 1601   CALCIUM 6.9 (L) 06/15/2022 1601   PROT 7.9 06/15/2022 1601   ALBUMIN 4.0 06/15/2022 1601   AST 21 06/15/2022 1601   ALT 12 06/15/2022 1601   ALKPHOS 188 (H) 06/15/2022 1601   BILITOT 0.9 06/15/2022 1601   GFRNONAA 5 (L) 06/15/2022 1601     VAS [place] ABI WITH/WO TBI  Result Date: 06/03/2023  LOWER EXTREMITY DOPPLER STUDY Patient Name:  Connie Olsen  Date of Exam:   05/30/2023 Medical Rec #: [id number]           Accession #:    [id number] Date of Birth: 13-Jan-1946           Patient Gender: F Patient Age:   43 years Exam Location:  [facility] Procedure:      VAS [place] ABI WITH/WO TBI Referring Phys: [facility] --------------------------------------------------------------------------------  Indications: Ulceration. High Risk Factors: Hypertension, hyperlipidemia, Diabetes, no history of                    smoking.  Limitations: Today's exam was limited due to patient unable to lie flat, patient              in wheelchair and Patient refused brachial pressures, radial              pressures were obtained. Performing Technologist: Hardie Lora RVT  Examination Guidelines: A complete evaluation includes at minimum, Doppler waveform signals and systolic blood pressure reading at the level of bilateral brachial, anterior tibial, and posterior tibial arteries, when vessel segments are accessible. Bilateral testing is considered an integral part of a complete examination. Photoelectric Plethysmograph (PPG) waveforms and toe systolic pressure readings are included as required and additional duplex testing as needed. Limited examinations for reoccurring indications may be performed as noted.  ABI Findings:  +---------+------------------+-----+---------+--------+ Right    Rt Pressure (mmHg)IndexWaveform Comment  +---------+------------------+-----+---------+--------+ Brachial 126                                      +---------+------------------+-----+---------+--------+ PTA      255               2.02 triphasic         +---------+------------------+-----+---------+--------+ DP       255               2.02 biphasic          +---------+------------------+-----+---------+--------+ Great Toe106               0.84                   +---------+------------------+-----+---------+--------+ +---------+------------------+-----+--------+-------+ Left     Lt Pressure (mmHg)IndexWaveformComment +---------+------------------+-----+--------+-------+ Brachial 126                                    +---------+------------------+-----+--------+-------+ PTA      154               1.22 biphasic        +---------+------------------+-----+--------+-------+ DP  Component Value Date/Time   NA 141 06/15/2022 1601   K 3.5 06/15/2022 1601   CL 101 06/15/2022 1601   CO2 22 06/15/2022 1601   GLUCOSE 139 (H) 06/15/2022 1601   BUN 43 (H) 06/15/2022 1601   CREATININE 7.48 (H) 06/15/2022 1601   CALCIUM 6.9 (L) 06/15/2022 1601   PROT 7.9 06/15/2022 1601   ALBUMIN 4.0 06/15/2022 1601   AST 21 06/15/2022 1601   ALT 12 06/15/2022 1601   ALKPHOS 188 (H) 06/15/2022 1601   BILITOT 0.9 06/15/2022 1601   GFRNONAA 5 (L) 06/15/2022 1601     VAS [place] ABI WITH/WO TBI  Result Date: 06/03/2023  LOWER EXTREMITY DOPPLER STUDY Patient Name:  Connie Olsen  Date of Exam:   05/30/2023 Medical Rec #: [id number]           Accession #:    [id number] Date of Birth: 13-Jan-1946           Patient Gender: F Patient Age:   43 years Exam Location:  [facility] Procedure:      VAS [place] ABI WITH/WO TBI Referring Phys: [facility] --------------------------------------------------------------------------------  Indications: Ulceration. High Risk Factors: Hypertension, hyperlipidemia, Diabetes, no history of                    smoking.  Limitations: Today's exam was limited due to patient unable to lie flat, patient              in wheelchair and Patient refused brachial pressures, radial              pressures were obtained. Performing Technologist: Hardie Lora RVT  Examination Guidelines: A complete evaluation includes at minimum, Doppler waveform signals and systolic blood pressure reading at the level of bilateral brachial, anterior tibial, and posterior tibial arteries, when vessel segments are accessible. Bilateral testing is considered an integral part of a complete examination. Photoelectric Plethysmograph (PPG) waveforms and toe systolic pressure readings are included as required and additional duplex testing as needed. Limited examinations for reoccurring indications may be performed as noted.  ABI Findings:  +---------+------------------+-----+---------+--------+ Right    Rt Pressure (mmHg)IndexWaveform Comment  +---------+------------------+-----+---------+--------+ Brachial 126                                      +---------+------------------+-----+---------+--------+ PTA      255               2.02 triphasic         +---------+------------------+-----+---------+--------+ DP       255               2.02 biphasic          +---------+------------------+-----+---------+--------+ Great Toe106               0.84                   +---------+------------------+-----+---------+--------+ +---------+------------------+-----+--------+-------+ Left     Lt Pressure (mmHg)IndexWaveformComment +---------+------------------+-----+--------+-------+ Brachial 126                                    +---------+------------------+-----+--------+-------+ PTA      154               1.22 biphasic        +---------+------------------+-----+--------+-------+ DP  Subjective:    Patient ID: Connie Olsen, female    DOB: 1946-08-29, 77 y.o.   MRN: [id number] Chief Complaint  Patient presents with   Follow-up    3 week follow up with ABI    The patient returns to the office for followup and review status post angiogram with intervention on 04/23/2023.   Procedure: Procedure(s) Performed:             1.  Introduction catheter into right  lower extremity 3rd order catheter placement              2.    Contrast injection right lower extremity for distal runoff             3.  Percutaneous transluminal angioplasty and stent placement right superficial femoral artery and popliteal             4.  Percutaneous transluminal angioplasty right anterior tibial artery.               5.  Star close closure left common femoral arteriotomy   The patient notes improvement in the lower extremity symptoms. No interval shortening of the patient's claudication distance or rest pain symptoms. No new ulcers or wounds have occurred since the last visit.  The previous ulcers and wounds have very nearly healed at this time since the angiogram.  There have been no significant changes to the patient's overall health care.  No documented history of amaurosis fugax or recent TIA symptoms. There are no recent neurological changes noted. No documented history of DVT, PE or superficial thrombophlebitis. The patient denies recent episodes of angina or shortness of breath.   ABIs are noncompressible bilaterally which is consistent with previous studies but there is in the TBI's bilaterally to 0.84.  The right lower extremity has triphasic/biphasic tibial waveforms with biphasic waveforms on the left lower extremity.    Review of Systems  Cardiovascular:  Positive for leg swelling.  Musculoskeletal:  Positive for gait problem.  Skin:  Positive for wound.  All other systems reviewed and are negative.      Objective:   Physical Exam Vitals reviewed.  HENT:      Head: Normocephalic.  Cardiovascular:     Rate and Rhythm: Normal rate.     Pulses:          Dorsalis pedis pulses are detected w/ Doppler on the right side and detected w/ Doppler on the left side.       Posterior tibial pulses are detected w/ Doppler on the right side and detected w/ Doppler on the left side.  Pulmonary:     Effort: Pulmonary effort is normal.  Skin:    General: Skin is warm and dry.  Neurological:     Mental Status: She is alert and oriented to person, place, and time.  Psychiatric:        Mood and Affect: Mood normal.        Behavior: Behavior normal.        Thought Content: Thought content normal.        Judgment: Judgment normal.     BP 137/61 (BP Location: Right Wrist)   Pulse 71   Resp 18   Ht 4\' 11"  (1.499 m)   Wt 153 lb (69.4 kg)   BMI 30.90 kg/m   Past Medical History:  Diagnosis Date   Barrett's esophagus without dysplasia    Cholangiocarcinoma (HCC)    Chronic kidney disease    DCIS (

## 2023-06-20 ENCOUNTER — Inpatient Hospital Stay: Payer: Medicare Other

## 2023-06-24 ENCOUNTER — Telehealth: Payer: Self-pay | Admitting: Internal Medicine

## 2023-06-24 NOTE — Telephone Encounter (Signed)
Please have pt follow up in the week of NOV 4th- MD; labs- after the MD visit- GB

## 2023-07-02 ENCOUNTER — Inpatient Hospital Stay: Payer: Medicare Other

## 2023-07-07 NOTE — Progress Notes (Deleted)
[facility] CONSULT NOTE  Patient Care Team: [place], [facility] as PCP - General Rosey Bath, MD (Inactive) as Referring Physician (Hematology and Oncology) Earna Coder, MD as Consulting Physician (Hematology and Oncology)  CHIEF COMPLAINTS/PURPOSE OF CONSULTATION: gall bladder cancer/DCIS/   #  Oncology History Overview Note  Laparoscopic cholecystectomy, 04/15/14; Radical cholecystectomy with portal LAD, segment 4B/5 partial hepatectomy, 05/27/14.  found to have a gallbladder mass on CT that was followed by an MRI that raised concern for gallbladder cancer as well as a bile duct stricture followed by EUS and ERCP that revealed no further pathology. She was taken to the OR 8/13 for a lap chole with the intent that should frozen pathology demonstrate concern for cancer, a more radical resection would be done. However, at the time of surgery, though frozen section was negative for cancer, final pathology demonstrated a T2N0 well differentiated adenocarcinoma, margins negative.  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 Othella Boyer       HISTORY OF PRESENTING ILLNESS: Patient ambulating-in wheel chair  Alone.  Connie Olsen 77 y.o.  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  Previously described lytic lesions are not metabolically active. In the appropriate clinical setting, they could at least in part be degenerative. However, some are not adjacent to joints, inactive or smoldering multiple myeloma could produce a similar appearance with  numerous lytic lesions without significant uptake, as well as other poorly avid malignancies. The nodes are not avid as well, but this could again represent a low-grade malignancy with low FDG avidity   Patient states that she has not had a mammogram for at least 2 years or more.  Denies any new lumps or bumps.  Otherwise denies any unusual chest pain or shortness of breath or cough.  Denies abdominal pain nausea vomiting or weight loss.  I  Review of Systems  Constitutional:  Positive for malaise/fatigue. Negative for chills, diaphoresis and fever.  HENT:  Negative for nosebleeds and sore throat.   Eyes:  Negative for double vision.  Respiratory:  Negative for cough, hemoptysis, sputum production, shortness of breath and wheezing.   Cardiovascular:  Negative for chest pain, palpitations, orthopnea and leg swelling.  Gastrointestinal:  Negative for abdominal pain, blood in stool, constipation, diarrhea, heartburn, melena, nausea and vomiting.  Genitourinary:  Negative for dysuria, frequency and urgency.  Musculoskeletal:  Positive for back pain. Negative for joint pain.  Skin: Negative.  Negative for itching and rash.  Neurological:  Negative for dizziness, tingling, focal weakness, weakness and headaches.  Endo/Heme/Allergies:  Does not bruise/bleed easily.  Psychiatric/Behavioral:  Negative for depression. The patient is not nervous/anxious and does not have insomnia.      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   Endometrial thickening on ultra sound 04/19/2015   History of ulcerative colitis    History of uterine fibroid    Osteoporosis     SURGICAL  HISTORY: Past Surgical History:  Procedure Laterality Date   CHOLECYSTECTOMY     colonoscopy  05/08/2016   DIALYSIS/PERMA CATHETER REPAIR     LAPAROSCOPIC PARTIAL  HEPATECTOMY     LOWER EXTREMITY ANGIOGRAPHY Right 04/23/2023   Procedure: Lower Extremity Angiography;  Surgeon: Renford Dills, MD;  Location: [facility] INVASIVE CV LAB;  Service: Cardiovascular;  Laterality: Right;   MASTECTOMY PARTIAL / LUMPECTOMY     PORTA CATH INSERTION N/A 06/06/2021   Procedure: PORTA CATH INSERTION;  Surgeon: Renford Dills, MD;  Location: [facility] INVASIVE CV LAB;  Service: Cardiovascular;  Laterality: N/A;   UPPER GI ENDOSCOPY  05/08/2016    SOCIAL HISTORY: Social History   Socioeconomic History   Marital status: Legally Separated    Spouse name: Not on file   Number of children: Not on file   Years of education: Not on file   Highest education level: Not on file  Occupational History   Not on file  Tobacco Use   Smoking status: Never   Smokeless tobacco: Never  Vaping Use   Vaping status: Never Used  Substance and Sexual Activity   Alcohol use: No    Alcohol/week: 0.0 standard drinks of alcohol   Drug use: No   Sexual activity: Not on file  Other Topics Concern   Not on file  Social History Narrative   Not on file   Social Determinants of Health   Financial Resource Strain: Low Risk  (12/26/2022)   [facility], [facility]   Overall Financial Resource Strain (CARDIA)    Difficulty of Paying Living Expenses: Not hard at all  Food Insecurity: No Food Insecurity (06/05/2023)   Received from [facility]   Hunger Vital Sign    Worried About Running Out of Food in the Last Year: Never true    Ran Out of Food in the Last Year: Never true  Transportation Needs: Unmet Transportation Needs (06/11/2023)   Received from [facility]   PRAPARE - Transportation    Lack of Transportation (Medical): Yes    Lack of Transportation (Non-Medical): Yes  Physical Activity: Not on file  Stress: Not on file  Social Connections: Not on file  Intimate Partner Violence: Not on file    FAMILY HISTORY: Family History  Problem Relation  Age of Onset   Breast cancer Mother     ALLERGIES:  is allergic to aspirin, brassica napus seed oil, naltrexone, librax  [chlordiazepoxide-clidinium], povidone-iodine, and tape.  MEDICATIONS:  Current Outpatient Medications  Medication Sig Dispense Refill   Amino Acids-Protein Hydrolys (PRO-STAT AWC) LIQD Take by mouth.     aspirin EC 81 MG tablet Take 81 mg by mouth at bedtime.     B Complex-C-Folic Acid (DIALYVITE TABLET) TABS Take 1 tablet by mouth at bedtime.     Bismuth Subsalicylate 525 MG/15ML SUSP Take by mouth.     cinacalcet (SENSIPAR) 90 MG tablet Take 90 mg by mouth See admin instructions. Take 90 mg by mouth daily with largest meal of the day.  11   clopidogrel (PLAVIX) 75 MG tablet Take 1 tablet (75 mg total) by mouth daily. 30 tablet 5   diclofenac Sodium (VOLTAREN) 1 % GEL Apply topically.     diphenhydrAMINE (BENADRYL ALLERGY) 25 mg capsule Take by mouth.     ferric citrate (AURYXIA) 1 GM 210 MG(Fe) tablet Take 420 mg by mouth 3 (three) times daily with meals.     ibuprofen (ADVIL) 200 MG tablet  Take 200-400 mg by mouth every 6 (six) hours as needed for moderate pain.     lidocaine-prilocaine (EMLA) cream Apply 1 application topically daily as needed (prior to port use).     metoprolol tartrate (LOPRESSOR) 25 MG tablet Take 12.5 mg by mouth 2 (two) times daily.  1   midodrine (PROAMATINE) 10 MG tablet Take 10 mg by mouth 3 (three) times daily.     naloxone (NARCAN) nasal spray 4 mg/0.1 mL Place into the nose.     omeprazole (PRILOSEC) 20 MG capsule Take 20 mg by mouth in the morning.  8   pantoprazole (PROTONIX) 40 MG tablet Take 1 tablet (40 mg total) by mouth daily. 30 tablet 11   pregabalin (LYRICA) 25 MG capsule Take 25 mg by mouth 3 (three) times daily.     sodium chloride (PF) 0.9 % SOLN 1 mL with cefTAZidime 1 mg/mL SOLN 1.25 mg, heparin sodium (porcine) 1000 UNIT/ML SOLN 250 Units Inject into the vein.     sodium thiosulfate 250 MG/ML injection Inject into the  vein.     vancomycin (VANCOCIN) 750 MG SOLR injection Vancomycin 750 mg IV with each dialysis session (every Monday, Wednesday, Friday) for skin and soft tissue infection left lower extremity.  MRSA positive culture.  Nephrology to dose and adjust per weekly vancomycin troughs and surveillance labs; CBC and BMP.     Zinc Sulfate 220 (50 Zn) MG TABS Take by mouth.     No current facility-administered medications for this visit.   Facility-Administered Medications Ordered in Other Visits  Medication Dose Route Frequency Provider Last Rate Last Admin   sodium chloride flush (NS) 0.9 % injection 10 mL  10 mL Intravenous PRN Nelva Nay C, MD   10 mL at 11/22/20 1314      .  PHYSICAL EXAMINATION: ECOG PERFORMANCE STATUS: 0 - Asymptomatic  There were no vitals filed for this visit.  There were no vitals filed for this visit.   Physical Exam Vitals and nursing note reviewed.  Constitutional:      Comments:      HENT:     Head: Normocephalic and atraumatic.     Mouth/Throat:     Pharynx: Oropharynx is clear.  Eyes:     Extraocular Movements: Extraocular movements intact.     Pupils: Pupils are equal, round, and reactive to light.  Cardiovascular:     Rate and Rhythm: Normal rate and regular rhythm.  Pulmonary:     Comments: Decreased breath sounds bilaterally.  Abdominal:     Palpations: Abdomen is soft.  Musculoskeletal:        General: Normal range of motion.     Cervical back: Normal range of motion.  Skin:    General: Skin is warm.  Neurological:     General: No focal deficit present.     Mental Status: She is alert and oriented to person, place, and time.  Psychiatric:        Behavior: Behavior normal.        Judgment: Judgment normal.      LABORATORY DATA:  I have reviewed the data as listed Lab Results  Component Value Date   WBC 8.4 06/15/2022   HGB 13.2 06/15/2022   HCT 42.7 06/15/2022   MCV 78.2 (L) 06/15/2022   PLT 228 06/15/2022   No  results for input(s): "NA", "K", "CL", "CO2", "GLUCOSE", "BUN", "CREATININE", "CALCIUM", "GFRNONAA", "GFRAA", "PROT", "ALBUMIN", "AST", "ALT", "ALKPHOS", "BILITOT", "BILIDIR", "IBILI" in the last 8760 hours.  RADIOGRAPHIC STUDIES: I have personally reviewed the radiological images as listed and agreed with the findings in the report. No results found.  ASSESSMENT & PLAN:   No problem-specific Assessment & Plan notes found for this encounter.   All questions were answered. The patient knows to call the clinic with any problems, questions or concerns.     Earna Coder, MD 07/07/2023 9:20 PM

## 2023-07-07 NOTE — Assessment & Plan Note (Deleted)
#   Gallbladder cancer s/p laparoscopic cholecystectomy and liver wedge biopsy by Dr Kathrynn Ducking at [facility] on 04/15/2014.  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  Tumor invaded perimuscular connective tissue with no extension beyond the serosa or into the liver.  Margins were uninvolved (closest 0.5 mm).  There is no lymphovascular or perineural invasion.  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  One lymph node was negative. Pathologic stage was pT2 pN0.  # Last CT scan OCT 2021- NEGATIVE for any recurrence. No further surveillance imaging is recommended.    OCT 3rd, 2024- Previously described lytic lesions are not metabolically active. In the appropriate clinical setting, they could at least in part be degenerative. However, some are not adjacent to joints, inactive or smoldering multiple myeloma could produce a similar appearance with numerous lytic lesions without significant uptake, as well as other poorly avid malignancies. The nodes are not avid as well, but this could again represent a low-grade malignancy with low FDG avidity    #  DCIS left breast (11/2014) s/p multiple excisions with + margins.  DCIS was ER and PR positive.  She declined further surgery or radiation.  She remains on Armidex.  She is followed by Dr. Roland Earl at [facility].  Diagnostic bilateral mammogram on 12/31/2019 revealed no evidence of malignancy.  recommend compliance with Bilateral mammograms.  Patient wants to proceed with mammograms locally.  Ordered today.  # ESRD- on permacath [M/W/F]- [place] [place].   # # Multiple graft failure-unclear etiology: Continue aspirin 325 mg at this time.    # Poor IV access- has it flushed every 6 weeks. No concerns for any malfunction  Transportation- # DISPOSITION: #  Bilateral screening mammograms  # port flush every 6-8 weeks  # Follow up with MD- in 12 months;  port flush; No labs-- Dr.B.

## 2023-07-08 ENCOUNTER — Inpatient Hospital Stay: Payer: Medicare Other | Admitting: Internal Medicine

## 2023-07-08 ENCOUNTER — Inpatient Hospital Stay: Payer: Medicare Other

## 2023-07-08 DIAGNOSIS — C23 Malignant neoplasm of gallbladder: Secondary | ICD-10-CM

## 2023-07-16 ENCOUNTER — Inpatient Hospital Stay: Payer: Medicare Other | Attending: Internal Medicine | Admitting: Internal Medicine

## 2023-07-16 ENCOUNTER — Encounter: Payer: Self-pay | Admitting: Internal Medicine

## 2023-07-16 ENCOUNTER — Inpatient Hospital Stay: Payer: Medicare Other

## 2023-07-16 VITALS — BP 115/47 | HR 77 | Temp 98.2°F | Ht 59.0 in | Wt 143.0 lb

## 2023-07-16 DIAGNOSIS — Z86000 Personal history of in-situ neoplasm of breast: Secondary | ICD-10-CM | POA: Diagnosis not present

## 2023-07-16 DIAGNOSIS — M899 Disorder of bone, unspecified: Secondary | ICD-10-CM | POA: Diagnosis not present

## 2023-07-16 DIAGNOSIS — Z95828 Presence of other vascular implants and grafts: Secondary | ICD-10-CM

## 2023-07-16 DIAGNOSIS — Z992 Dependence on renal dialysis: Secondary | ICD-10-CM | POA: Diagnosis not present

## 2023-07-16 DIAGNOSIS — Z7902 Long term (current) use of antithrombotics/antiplatelets: Secondary | ICD-10-CM | POA: Insufficient documentation

## 2023-07-16 DIAGNOSIS — Z803 Family history of malignant neoplasm of breast: Secondary | ICD-10-CM | POA: Insufficient documentation

## 2023-07-16 DIAGNOSIS — Z8509 Personal history of malignant neoplasm of other digestive organs: Secondary | ICD-10-CM | POA: Diagnosis present

## 2023-07-16 DIAGNOSIS — N186 End stage renal disease: Secondary | ICD-10-CM | POA: Diagnosis not present

## 2023-07-16 DIAGNOSIS — C23 Malignant neoplasm of gallbladder: Secondary | ICD-10-CM | POA: Diagnosis not present

## 2023-07-16 DIAGNOSIS — I739 Peripheral vascular disease, unspecified: Secondary | ICD-10-CM | POA: Insufficient documentation

## 2023-07-16 MED ORDER — HEPARIN SOD (PORK) LOCK FLUSH 100 UNIT/ML IV SOLN
500.0000 [IU] | Freq: Once | INTRAVENOUS | Status: AC
  Administered 2023-07-16: 500 [IU] via INTRAVENOUS
  Filled 2023-07-16: qty 5

## 2023-07-16 MED ORDER — SODIUM CHLORIDE 0.9% FLUSH
10.0000 mL | Freq: Once | INTRAVENOUS | Status: AC
  Administered 2023-07-16: 10 mL via INTRAVENOUS
  Filled 2023-07-16: qty 10

## 2023-07-16 NOTE — Progress Notes (Signed)
[facility] CONSULT NOTE  Patient Care Team: [place], [facility] as PCP - General Earna Coder, MD as Consulting Physician (Hematology and Oncology)  CHIEF COMPLAINTS/PURPOSE OF CONSULTATION: gall bladder cancer/DCIS/   #  Oncology History Overview Note  Laparoscopic cholecystectomy, 04/15/14; Radical cholecystectomy with portal LAD, segment 4B/5 partial hepatectomy, 05/27/14.  found to have a gallbladder mass on CT that was followed by an MRI that raised concern for gallbladder cancer as well as a bile duct stricture followed by EUS and ERCP that revealed no further pathology. She was taken to the OR 8/13 for a lap chole with the intent that should frozen pathology demonstrate concern for cancer, a more radical resection would be done. However, at the time of surgery, though frozen section was negative for cancer, final pathology demonstrated a T2N0 well differentiated adenocarcinoma, margins negative.  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 Othella Boyer      HISTORY OF PRESENTING ILLNESS: Patient ambulating-in wheel chair  Alone.  Connie Olsen 77 y.o.  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  Patient had stent placed in Leg. Postoperatively complicated by Tx for C diff.    Incidentally noted to have multiple lytic lesions- which led to PET scan in oct 2024. "Previously described lytic lesions are not metabolically active. In the appropriate clinical setting, they could at least in part be degenerative. However, some are not adjacent to joints,  inactive or smoldering multiple myeloma could produce a similar appearance with numerous lytic lesions without significant uptake, as well as other poorly avid malignancies. The nodes are not avid as well, but this could again represent a low-grade malignancy with low FDG avidity"   Patient states that she has not had a mammogram for at least 2 years or more.  Denies any new lumps or bumps.  Otherwise denies any unusual chest pain or shortness of breath or cough.  Denies abdominal pain nausea vomiting or weight loss.   Review of Systems  Constitutional:  Positive for malaise/fatigue. Negative for chills, diaphoresis and fever.  HENT:  Negative for nosebleeds and sore throat.   Eyes:  Negative for double vision.  Respiratory:  Negative for cough, hemoptysis, sputum production, shortness of breath and wheezing.   Cardiovascular:  Negative for chest pain, palpitations, orthopnea and leg swelling.  Gastrointestinal:  Negative for abdominal pain, blood in stool, constipation, diarrhea, heartburn, melena, nausea and vomiting.  Genitourinary:  Negative for dysuria, frequency and urgency.  Musculoskeletal:  Positive for back pain. Negative for joint pain.  Skin: Negative.  Negative for itching and rash.  Neurological:  Negative for dizziness, tingling, focal weakness, weakness and headaches.  Endo/Heme/Allergies:  Does not bruise/bleed easily.  Psychiatric/Behavioral:  Negative for depression. The patient is not nervous/anxious and does not have insomnia.      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   Endometrial thickening on ultra sound 04/19/2015   History of  ulcerative colitis    History of uterine fibroid    Osteoporosis     SURGICAL HISTORY: Past Surgical History:  Procedure Laterality Date   CHOLECYSTECTOMY      colonoscopy  05/08/2016   DIALYSIS/PERMA CATHETER REPAIR     LAPAROSCOPIC PARTIAL HEPATECTOMY     LOWER EXTREMITY ANGIOGRAPHY Right 04/23/2023   Procedure: Lower Extremity Angiography;  Surgeon: Renford Dills, MD;  Location: [facility] INVASIVE CV LAB;  Service: Cardiovascular;  Laterality: Right;   MASTECTOMY PARTIAL / LUMPECTOMY     PORTA CATH INSERTION N/A 06/06/2021   Procedure: PORTA CATH INSERTION;  Surgeon: Renford Dills, MD;  Location: [facility] INVASIVE CV LAB;  Service: Cardiovascular;  Laterality: N/A;   UPPER GI ENDOSCOPY  05/08/2016    SOCIAL HISTORY: Social History   Socioeconomic History   Marital status: Legally Separated    Spouse name: Not on file   Number of children: Not on file   Years of education: Not on file   Highest education level: Not on file  Occupational History   Not on file  Tobacco Use   Smoking status: Never   Smokeless tobacco: Never  Vaping Use   Vaping status: Never Used  Substance and Sexual Activity   Alcohol use: No    Alcohol/week: 0.0 standard drinks of alcohol   Drug use: No   Sexual activity: Not on file  Other Topics Concern   Not on file  Social History Narrative   Not on file   Social Determinants of Health   Financial Resource Strain: Low Risk  (12/26/2022)   [facility]   Overall Financial Resource Strain (CARDIA)    Difficulty of Paying Living Expenses: Not hard at all  Food Insecurity: No Food Insecurity (06/05/2023)   Received from [facility]   Hunger Vital Sign    Worried About Running Out of Food in the Last Year: Never true    Ran Out of Food in the Last Year: Never true  Transportation Needs: Unmet Transportation Needs (06/11/2023)   Received from [facility]   PRAPARE - Transportation    Lack of Transportation (Medical): Yes    Lack of Transportation (Non-Medical): Yes  Physical Activity: Not on file  Stress: Not on file  Social Connections: Not on file  Intimate  Partner Violence: Not on file    FAMILY HISTORY: Family History  Problem Relation Age of Onset   Breast cancer Mother     ALLERGIES:  is allergic to aspirin, brassica napus seed oil, naltrexone, librax  [chlordiazepoxide-clidinium], povidone-iodine, and tape.  MEDICATIONS:  Current Outpatient Medications  Medication Sig Dispense Refill   Amino Acids-Protein Hydrolys (PRO-STAT AWC) LIQD Take by mouth.     aspirin EC 81 MG tablet Take 81 mg by mouth at bedtime.     B Complex-C-Folic Acid (DIALYVITE TABLET) TABS Take 1 tablet by mouth at bedtime.     Bismuth Subsalicylate 525 MG/15ML SUSP Take by mouth.     cinacalcet (SENSIPAR) 90 MG tablet Take 90 mg by mouth See admin instructions. Take 90 mg by mouth daily with largest meal of the day.  11   clopidogrel (PLAVIX) 75 MG tablet Take 1 tablet (75 mg total) by mouth daily. 30 tablet 5   diclofenac Sodium (VOLTAREN) 1 % GEL Apply topically.     diphenhydrAMINE (BENADRYL ALLERGY) 25 mg capsule Take by mouth.     ferric citrate (AURYXIA) 1 GM 210 MG(Fe) tablet Take 420  mg by mouth 3 (three) times daily with meals.     ibuprofen (ADVIL) 200 MG tablet Take 200-400 mg by mouth every 6 (six) hours as needed for moderate pain.     lidocaine-prilocaine (EMLA) cream Apply 1 application topically daily as needed (prior to port use).     metoprolol tartrate (LOPRESSOR) 25 MG tablet Take 12.5 mg by mouth 2 (two) times daily.  1   midodrine (PROAMATINE) 10 MG tablet Take 10 mg by mouth 3 (three) times daily.     naloxone (NARCAN) nasal spray 4 mg/0.1 mL Place into the nose.     omeprazole (PRILOSEC) 20 MG capsule Take 20 mg by mouth in the morning.  8   pantoprazole (PROTONIX) 40 MG tablet Take 1 tablet (40 mg total) by mouth daily. 30 tablet 11   pregabalin (LYRICA) 25 MG capsule Take 25 mg by mouth 3 (three) times daily.     sodium chloride (PF) 0.9 % SOLN 1 mL with cefTAZidime 1 mg/mL SOLN 1.25 mg, heparin sodium (porcine) 1000 UNIT/ML SOLN 250 Units  Inject into the vein.     sodium thiosulfate 250 MG/ML injection Inject into the vein.     vancomycin (VANCOCIN) 750 MG SOLR injection Vancomycin 750 mg IV with each dialysis session (every Monday, Wednesday, Friday) for skin and soft tissue infection left lower extremity.  MRSA positive culture.  Nephrology to dose and adjust per weekly vancomycin troughs and surveillance labs; CBC and BMP.     Zinc Sulfate 220 (50 Zn) MG TABS Take by mouth.     No current facility-administered medications for this visit.   Facility-Administered Medications Ordered in Other Visits  Medication Dose Route Frequency Provider Last Rate Last Admin   sodium chloride flush (NS) 0.9 % injection 10 mL  10 mL Intravenous PRN Nelva Nay C, MD   10 mL at 11/22/20 1314      .  PHYSICAL EXAMINATION: ECOG PERFORMANCE STATUS: 0 - Asymptomatic  Vitals:   07/16/23 1249  BP: (!) 115/47  Pulse: 77  Temp: 98.2 F (36.8 C)  SpO2: 97%    Filed Weights   07/16/23 1249  Weight: 143 lb (64.9 kg)     Physical Exam Vitals and nursing note reviewed.  Constitutional:      Comments:      HENT:     Head: Normocephalic and atraumatic.     Mouth/Throat:     Pharynx: Oropharynx is clear.  Eyes:     Extraocular Movements: Extraocular movements intact.     Pupils: Pupils are equal, round, and reactive to light.  Cardiovascular:     Rate and Rhythm: Normal rate and regular rhythm.  Pulmonary:     Comments: Decreased breath sounds bilaterally.  Abdominal:     Palpations: Abdomen is soft.  Musculoskeletal:        General: Normal range of motion.     Cervical back: Normal range of motion.  Skin:    General: Skin is warm.  Neurological:     General: No focal deficit present.     Mental Status: She is alert and oriented to person, place, and time.  Psychiatric:        Behavior: Behavior normal.        Judgment: Judgment normal.      LABORATORY DATA:  I have reviewed the data as listed Lab Results   Component Value Date   WBC 8.4 06/15/2022   HGB 13.2 06/15/2022   HCT 42.7 06/15/2022  MCV 78.2 (L) 06/15/2022   PLT 228 06/15/2022   No results for input(s): "NA", "K", "CL", "CO2", "GLUCOSE", "BUN", "CREATININE", "CALCIUM", "GFRNONAA", "GFRAA", "PROT", "ALBUMIN", "AST", "ALT", "ALKPHOS", "BILITOT", "BILIDIR", "IBILI" in the last 8760 hours.   RADIOGRAPHIC STUDIES: I have personally reviewed the radiological images as listed and agreed with the findings in the report. No results found.  ASSESSMENT & PLAN:   Cancer of gallbladder [facility]) # Gallbladder cancer s/p laparoscopic cholecystectomy and liver wedge biopsy by Dr Kathrynn Ducking at [facility] on 04/15/2014.  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  Tumor invaded perimuscular connective tissue with no extension beyond the serosa or into the liver.  Margins were uninvolved (closest 0.5 mm).  There is no lymphovascular or perineural invasion.  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  One lymph node was negative. Pathologic stage was pT2 pN0.  # Last CT scan OCT 2021- NEGATIVE for any recurrence. No further surveillance imaging is recommended- clinically less likely any recurrence. See below.     # OCT 3rd, 2024- Previously described lytic lesions are not metabolically active. In the appropriate clinical setting, they could at least in part be degenerative. However, some are not adjacent to joints, inactive or smoldering multiple myeloma could produce a similar appearance with numerous lytic lesions without significant uptake, as well as other poorly avid malignancies. The nodes are not avid as well, but this could again represent a low-grade malignancy with low FDG avidity.  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 HOLD off any further work up.  Continue follow up with Nephrology.   # Hx of  Barrett's; with intermittent [solids> liquids]- recommend evaluation with GI.   #OSTEOPOROSIS- 2021- [facility]- [Dr.Westbrrok]- : The T-score is -4.9 and the Z-score is -2.6.  This result is consistent with osteoporosis.  Given patient poor tolerance to oral bisphosphonate recommend referral to endocrinology for further evaluation recommendations. Will make a referral.    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  DCIS was ER and PR positive.  She declined further surgery or radiation. S/p  Armidex. X 5 years [re:Dr. Creig Hines at [facility]]. .AUG 2024- bilateral mammogram- NEG.   # ESRD- on permacath [M/W/F]- [facility]*; [place].   # PVD/ Multiple graft failure-unclear etiology: Continue Plavix 75  mg at this time.    # Poor IV access- has it flushed every 6 weeks. No concerns for any malfunction  *Transportation- # DISPOSITION: # refer to [facility]-endocrinology re; severe osteoporosis. # port flush every 6-8 weeks # move next July appt to- MD- in 12 months; port flush; No labs-- Dr.B.    All questions were answered. The patient knows to call the clinic with any problems, questions or concerns.     Earna Coder, MD 07/16/2023 2:07 PM

## 2023-07-16 NOTE — Assessment & Plan Note (Addendum)
#   Gallbladder cancer s/p laparoscopic cholecystectomy and liver wedge biopsy by Dr Kathrynn Ducking at [facility] on 04/15/2014.  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  Tumor invaded perimuscular connective tissue with no extension beyond the serosa or into the liver.  Margins were uninvolved (closest 0.5 mm).  There is no lymphovascular or perineural invasion.  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  One lymph node was negative. Pathologic stage was pT2 pN0.  # Last CT scan OCT 2021- NEGATIVE for any recurrence. No further surveillance imaging is recommended- clinically less likely any recurrence. See below.     # OCT 3rd, 2024- Previously described lytic lesions are not metabolically active. In the appropriate clinical setting, they could at least in part be degenerative. However, some are not adjacent to joints, inactive or smoldering multiple myeloma could produce a similar appearance with numerous lytic lesions without significant uptake, as well as other poorly avid malignancies. The nodes are not avid as well, but this could again represent a low-grade malignancy with low FDG avidity.  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 HOLD off any further work up.  Continue follow up with Nephrology.   # Hx of Barrett's; with intermittent [solids> liquids]- recommend evaluation with GI.   #OSTEOPOROSIS- 2021- [facility]- [Dr.Westbrrok]- : The T-score is -4.9 and the Z-score is -2.6.  This result is consistent with osteoporosis.  Given patient poor tolerance to oral bisphosphonate recommend referral to endocrinology for further evaluation recommendations. Will make a referral.    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  DCIS was ER and PR positive.  She declined further surgery or radiation. S/p  Armidex. X 5 years [re:Dr. Creig Hines at [facility]].  .AUG 2024- bilateral mammogram- NEG.   # ESRD- on permacath [M/W/F]- [facility]*; [place].   # PVD/ Multiple graft failure-unclear etiology: Continue Plavix 75  mg at this time.    # Poor IV access- has it flushed every 6 weeks. No concerns for any malfunction  *Transportation- # DISPOSITION: # refer to [facility]ology re; severe osteoporosis. # port flush every 6-8 weeks # move next July appt to- MD- in 12 months; port flush; No labs-- Dr.B.

## 2023-07-16 NOTE — Progress Notes (Signed)
Stent placed rt leg.  Tx for C diff. Had some labs done [facility]. Can you acces her labs to see what cancer they were looking for? Has lost 20lbs.  Was told her hip bones were too thin. Has had mri, u/s, chest xray, pet.

## 2023-08-08 ENCOUNTER — Inpatient Hospital Stay: Payer: Medicare Other

## 2023-08-08 ENCOUNTER — Telehealth: Payer: Self-pay

## 2023-08-08 NOTE — Telephone Encounter (Signed)
Spoke with Clydie Braun at [employer], their office tried calling pt and left several message to sch appt, pt has not called the office to make appt.

## 2023-08-22 ENCOUNTER — Inpatient Hospital Stay: Payer: Medicare Other | Attending: Internal Medicine

## 2023-08-22 DIAGNOSIS — Z95828 Presence of other vascular implants and grafts: Secondary | ICD-10-CM

## 2023-08-22 DIAGNOSIS — Z8509 Personal history of malignant neoplasm of other digestive organs: Secondary | ICD-10-CM | POA: Insufficient documentation

## 2023-08-22 MED ORDER — HEPARIN SOD (PORK) LOCK FLUSH 100 UNIT/ML IV SOLN
500.0000 [IU] | Freq: Once | INTRAVENOUS | Status: AC
  Administered 2023-08-22: 500 [IU] via INTRAVENOUS
  Filled 2023-08-22: qty 5

## 2023-08-22 MED ORDER — SODIUM CHLORIDE 0.9% FLUSH
10.0000 mL | INTRAVENOUS | Status: DC | PRN
  Administered 2023-08-22: 10 mL via INTRAVENOUS
  Filled 2023-08-22: qty 10

## 2023-09-19 ENCOUNTER — Inpatient Hospital Stay: Payer: Medicare Other

## 2023-09-24 ENCOUNTER — Other Ambulatory Visit (INDEPENDENT_AMBULATORY_CARE_PROVIDER_SITE_OTHER): Payer: Self-pay | Admitting: Vascular Surgery

## 2023-10-03 ENCOUNTER — Inpatient Hospital Stay: Payer: Medicare Other | Attending: Internal Medicine

## 2023-10-03 DIAGNOSIS — Z8509 Personal history of malignant neoplasm of other digestive organs: Secondary | ICD-10-CM | POA: Insufficient documentation

## 2023-10-03 DIAGNOSIS — Z95828 Presence of other vascular implants and grafts: Secondary | ICD-10-CM

## 2023-10-03 MED ORDER — SODIUM CHLORIDE 0.9% FLUSH
10.0000 mL | INTRAVENOUS | Status: DC | PRN
  Administered 2023-10-03: 10 mL via INTRAVENOUS
  Filled 2023-10-03: qty 10

## 2023-10-03 MED ORDER — HEPARIN SOD (PORK) LOCK FLUSH 100 UNIT/ML IV SOLN
500.0000 [IU] | Freq: Once | INTRAVENOUS | Status: AC
  Administered 2023-10-03: 500 [IU] via INTRAVENOUS
  Filled 2023-10-03: qty 5

## 2023-10-03 NOTE — Patient Instructions (Signed)
Implanted [facility] Guide An implanted port is a device that is placed under the skin. It is usually placed in the chest. The device may vary based on the need. Implanted ports can be used to give IV medicine, to take blood, or to give fluids. You may have an implanted port if: You need IV medicine that would be irritating to the small veins in your hands or arms. You need IV medicines, such as chemotherapy, for a long period of time. You need IV nutrition for a long period of time. You may have fewer limitations when using a port than you would if you used other types of long-term IVs. You will also likely be able to return to normal activities after your incision heals. An implanted port has two main parts: Reservoir. The reservoir is the part where a needle is inserted to give medicines or draw blood. The reservoir is round. After the port is placed, it appears as a small, raised area under your skin. Catheter. The catheter is a small, thin tube that connects the reservoir to a vein. Medicine that is inserted into the reservoir goes into the catheter and then into the vein. How is my port accessed? To access your port: A numbing cream may be placed on the skin over the port site. Your health care provider will put on a mask and sterile gloves. The skin over your port will be cleaned carefully with a germ-killing soap and allowed to dry. Your health care provider will gently pinch the port and insert a needle into it. Your health care provider will check for a blood return to make sure the port is in the vein and is still working (patent). If your port needs to remain accessed to get medicine continuously (constant infusion), your health care provider will place a clear bandage (dressing) over the needle site. The dressing and needle will need to be changed every week, or as told by your health care provider. What is flushing? Flushing helps keep the port working. Follow instructions from your  health care provider about how and when to flush the port. Ports are usually flushed with saline solution or a medicine called heparin. The need for flushing will depend on how the port is used: If the port is only used from time to time to give medicines or draw blood, the port may need to be flushed: Before and after medicines have been given. Before and after blood has been drawn. As part of routine maintenance. Flushing may be recommended every 4-6 weeks. If a constant infusion is running, the port may not need to be flushed. Throw away any syringes in a disposal container that is meant for sharp items (sharps container). You can buy a sharps container from a pharmacy, or you can make one by using an empty hard plastic bottle with a cover. How long will my port stay implanted? The port can stay in for as long as your health care provider thinks it is needed. When it is time for the port to come out, a surgery will be done to remove it. The surgery will be similar to the procedure that was done to put the port in. Follow these instructions at home: Caring for your port and port site Flush your port as told by your health care provider. If you need an infusion over several days, follow instructions from your health care provider about how to take care of your port site. Make sure you: Change your  dressing as told by your health care provider. Wash your hands with soap and water for at least 20 seconds before and after you change your dressing. If soap and water are not available, use alcohol-based hand sanitizer. Place any used dressings or infusion bags into a plastic bag. Throw that bag in the trash. Keep the dressing that covers the needle clean and dry. Do not get it wet. Do not use scissors or sharp objects near the infusion tubing. Keep any external tubes clamped, unless they are being used. Check your port site every day for signs of infection. Check for: Redness, swelling, or  pain. Fluid or blood. Warmth. Pus or a bad smell. Protect the skin around the port site. Avoid wearing bra straps that rub or irritate the site. Protect the skin around your port from seat belts. Place a soft pad over your chest if needed. Bathe or shower as told by your health care provider. The site may get wet as long as you are not actively receiving an infusion. General instructions  Return to your normal activities as told by your health care provider. Ask your health care provider what activities are safe for you. Carry a medical alert card or wear a medical alert bracelet at all times. This will let health care providers know that you have an implanted port in case of an emergency. Where to find more information American Cancer Society: www.cancer.org American Society of Clinical Oncology: www.cancer.net Contact a health care provider if: You have a fever or chills. You have redness, swelling, or pain at the port site. You have fluid or blood coming from your port site. Your incision feels warm to the touch. You have pus or a bad smell coming from the port site. Summary Implanted ports are usually placed in the chest for long-term IV access. Follow instructions from your health care provider about flushing the port and changing bandages (dressings). Take care of the area around your port by avoiding clothing that puts pressure on the area, and by watching for signs of infection. Protect the skin around your port from seat belts. Place a soft pad over your chest if needed. Contact a health care provider if you have a fever or you have redness, swelling, pain, fluid, or a bad smell at the port site. This information is not intended to replace advice given to you by your health care provider. Make sure you discuss any questions you have with your health care provider. Document Revised: 02/21/2021 Document Reviewed: 02/21/2021 Elsevier Patient Education  2024 [employer].

## 2023-10-11 ENCOUNTER — Other Ambulatory Visit: Payer: Self-pay | Admitting: *Deleted

## 2023-10-11 ENCOUNTER — Telehealth: Payer: Self-pay | Admitting: Internal Medicine

## 2023-10-11 DIAGNOSIS — E213 Hyperparathyroidism, unspecified: Secondary | ICD-10-CM

## 2023-10-11 DIAGNOSIS — N2581 Secondary hyperparathyroidism of renal origin: Secondary | ICD-10-CM

## 2023-10-11 DIAGNOSIS — C50912 Malignant neoplasm of unspecified site of left female breast: Secondary | ICD-10-CM

## 2023-10-11 NOTE — Telephone Encounter (Signed)
 Pt called requesting to have a lab drawn through her port. She stated she needs labs for her bone scan on 2/11 requested by the Anna Salun at [facility]. Pt stated they cannot access her port. Pt states her veins shut down real quick so they would not get enough blood if they stuck her veins, which is why they need to use the port.  I told her we could not draw outside labs but pt wants Dr.B to set up a lab appt for this.  Pt stated we can leave a vm on the home phone if she doesn't answer.

## 2023-10-14 ENCOUNTER — Inpatient Hospital Stay: Payer: Medicare Other | Attending: Internal Medicine

## 2023-10-31 ENCOUNTER — Inpatient Hospital Stay: Payer: Medicare Other

## 2023-11-26 ENCOUNTER — Inpatient Hospital Stay: Payer: Medicare Other | Attending: Internal Medicine | Admitting: Internal Medicine

## 2023-11-26 ENCOUNTER — Inpatient Hospital Stay: Payer: Medicare Other

## 2023-11-26 ENCOUNTER — Telehealth: Payer: Self-pay | Admitting: *Deleted

## 2023-11-26 ENCOUNTER — Encounter: Payer: Self-pay | Admitting: Internal Medicine

## 2023-11-26 VITALS — BP 108/60 | HR 78 | Temp 97.1°F | Resp 18 | Ht 59.0 in | Wt 135.0 lb

## 2023-11-26 DIAGNOSIS — M899 Disorder of bone, unspecified: Secondary | ICD-10-CM | POA: Insufficient documentation

## 2023-11-26 DIAGNOSIS — I739 Peripheral vascular disease, unspecified: Secondary | ICD-10-CM | POA: Insufficient documentation

## 2023-11-26 DIAGNOSIS — Z803 Family history of malignant neoplasm of breast: Secondary | ICD-10-CM | POA: Diagnosis not present

## 2023-11-26 DIAGNOSIS — Z86000 Personal history of in-situ neoplasm of breast: Secondary | ICD-10-CM | POA: Diagnosis not present

## 2023-11-26 DIAGNOSIS — N186 End stage renal disease: Secondary | ICD-10-CM | POA: Insufficient documentation

## 2023-11-26 DIAGNOSIS — Z9049 Acquired absence of other specified parts of digestive tract: Secondary | ICD-10-CM | POA: Insufficient documentation

## 2023-11-26 DIAGNOSIS — C23 Malignant neoplasm of gallbladder: Secondary | ICD-10-CM | POA: Diagnosis not present

## 2023-11-26 DIAGNOSIS — M810 Age-related osteoporosis without current pathological fracture: Secondary | ICD-10-CM | POA: Diagnosis not present

## 2023-11-26 DIAGNOSIS — Z8509 Personal history of malignant neoplasm of other digestive organs: Secondary | ICD-10-CM | POA: Insufficient documentation

## 2023-11-26 DIAGNOSIS — Z95828 Presence of other vascular implants and grafts: Secondary | ICD-10-CM

## 2023-11-26 LAB — CMP (CANCER CENTER ONLY)
ALT: 18 U/L (ref 0–44)
AST: 26 U/L (ref 15–41)
Albumin: 3.2 g/dL — ABNORMAL LOW (ref 3.5–5.0)
Alkaline Phosphatase: 289 U/L — ABNORMAL HIGH (ref 38–126)
Anion gap: 17 — ABNORMAL HIGH (ref 5–15)
BUN: 41 mg/dL — ABNORMAL HIGH (ref 8–23)
CO2: 22 mmol/L (ref 22–32)
Calcium: 5.7 mg/dL — CL (ref 8.9–10.3)
Chloride: 101 mmol/L (ref 98–111)
Creatinine: 4.3 mg/dL — ABNORMAL HIGH (ref 0.44–1.00)
GFR, Estimated: 10 mL/min — ABNORMAL LOW (ref >60)
Glucose, Bld: 84 mg/dL (ref 70–99)
Potassium: 3.8 mmol/L (ref 3.5–5.1)
Sodium: 140 mmol/L (ref 135–145)
Total Bilirubin: 1 mg/dL (ref 0.0–1.2)
Total Protein: 7.4 g/dL (ref 6.5–8.1)

## 2023-11-26 MED ORDER — HEPARIN SOD (PORK) LOCK FLUSH 100 UNIT/ML IV SOLN
500.0000 [IU] | Freq: Once | INTRAVENOUS | Status: AC
  Administered 2023-11-26: 500 [IU] via INTRAVENOUS
  Filled 2023-11-26: qty 5

## 2023-11-26 MED ORDER — SODIUM CHLORIDE 0.9% FLUSH
10.0000 mL | Freq: Once | INTRAVENOUS | Status: AC
  Administered 2023-11-26: 10 mL via INTRAVENOUS
  Filled 2023-11-26: qty 10

## 2023-11-26 NOTE — Progress Notes (Signed)
 [facility] CONSULT NOTE  Patient Care Team: [place], [facility] as PCP - General Earna Coder, MD as Consulting Physician (Hematology and Oncology)  CHIEF COMPLAINTS/PURPOSE OF CONSULTATION: gall bladder cancer/DCIS/   #  Oncology History Overview Note  Laparoscopic cholecystectomy, 04/15/14; Radical cholecystectomy with portal LAD, segment 4B/5 partial hepatectomy, 05/27/14.  found to have a gallbladder mass on CT that was followed by an MRI that raised concern for gallbladder cancer as well as a bile duct stricture followed by EUS and ERCP that revealed no further pathology. She was taken to the OR 8/13 for a lap chole with the intent that should frozen pathology demonstrate concern for cancer, a more radical resection would be done. However, at the time of surgery, though frozen section was negative for cancer, final pathology demonstrated a T2N0 well differentiated adenocarcinoma, margins negative.  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 Othella Boyer      HISTORY OF PRESENTING ILLNESS: Patient ambulating-in wheel chair  Alone.  Connie Olsen 78 y.o.  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  S/p evaluation with [facility] endocrinology re: osteoporosis. Otherwise denies any unusual chest pain or shortness of breath or cough.  Denies abdominal pain nausea vomiting or weight loss.   Unfortunately continues to have chronic back pain.   Review of Systems   Constitutional:  Positive for malaise/fatigue. Negative for chills, diaphoresis and fever.  HENT:  Negative for nosebleeds and sore throat.   Eyes:  Negative for double vision.  Respiratory:  Negative for cough, hemoptysis, sputum production, shortness of breath and wheezing.   Cardiovascular:  Negative for chest pain, palpitations, orthopnea and leg swelling.  Gastrointestinal:  Negative for abdominal pain, blood in stool, constipation, diarrhea, heartburn, melena, nausea and vomiting.  Genitourinary:  Negative for dysuria, frequency and urgency.  Musculoskeletal:  Positive for back pain. Negative for joint pain.  Skin: Negative.  Negative for itching and rash.  Neurological:  Negative for dizziness, tingling, focal weakness, weakness and headaches.  Endo/Heme/Allergies:  Does not bruise/bleed easily.  Psychiatric/Behavioral:  Negative for depression. The patient is not nervous/anxious and does not have insomnia.      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   Endometrial thickening on ultra sound 04/19/2015   History of ulcerative colitis    History of uterine fibroid    Osteoporosis     SURGICAL HISTORY: Past Surgical History:  Procedure Laterality Date   CHOLECYSTECTOMY     colonoscopy  05/08/2016   DIALYSIS/PERMA CATHETER REPAIR     LAPAROSCOPIC PARTIAL HEPATECTOMY     LOWER EXTREMITY ANGIOGRAPHY Right 04/23/2023   Procedure: Lower Extremity Angiography;  Surgeon: Renford Dills, MD;  Location: [facility] INVASIVE CV LAB;  Service: Cardiovascular;  Laterality: Right;   MASTECTOMY PARTIAL / LUMPECTOMY     PORTA CATH INSERTION N/A 06/06/2021   Procedure: PORTA CATH INSERTION;  Surgeon:  Schnier, Latina Craver, MD;  Location: [facility] INVASIVE CV LAB;  Service: Cardiovascular;  Laterality: N/A;   UPPER GI ENDOSCOPY  05/08/2016     SOCIAL HISTORY: Social History   Socioeconomic History   Marital status: Legally Separated    Spouse name: Not on file   Number of children: Not on file   Years of education: Not on file   Highest education level: Not on file  Occupational History   Not on file  Tobacco Use   Smoking status: Never   Smokeless tobacco: Never  Vaping Use   Vaping status: Never Used  Substance and Sexual Activity   Alcohol use: No    Alcohol/week: 0.0 standard drinks of alcohol   Drug use: No   Sexual activity: Not on file  Other Topics Concern   Not on file  Social History Narrative   Not on file   Social Drivers of Health   Financial Resource Strain: Low Risk  (12/26/2022)   [facility]   Overall Financial Resource Strain (CARDIA)    Difficulty of Paying Living Expenses: Not hard at all  Food Insecurity: No Food Insecurity (06/05/2023)   Received from [facility]   Hunger Vital Sign    Worried About Running Out of Food in the Last Year: Never true    Ran Out of Food in the Last Year: Never true  Transportation Needs: Unmet Transportation Needs (06/11/2023)   Received from [facility]   PRAPARE - Transportation    Lack of Transportation (Medical): Yes    Lack of Transportation (Non-Medical): Yes  Physical Activity: Not on file  Stress: Not on file  Social Connections: Not on file  Intimate Partner Violence: Not on file    FAMILY HISTORY: Family History  Problem Relation Age of Onset   Breast cancer Mother     ALLERGIES:  is allergic to aspirin, brassica napus seed oil, naltrexone, librax  [chlordiazepoxide-clidinium], povidone-iodine, and tape.  MEDICATIONS:  Current Outpatient Medications  Medication Sig Dispense Refill   aspirin EC 81 MG tablet Take 81 mg by mouth at bedtime.     B Complex-C-Folic Acid (DIALYVITE TABLET) TABS Take 1 tablet by mouth at bedtime.     cinacalcet (SENSIPAR) 90 MG tablet Take 90 mg by mouth See admin  instructions. Take 90 mg by mouth daily with largest meal of the day.  11   clopidogrel (PLAVIX) 75 MG tablet TAKE 1 TABLET BY MOUTH EVERY DAY 90 tablet 1   diphenhydrAMINE (BENADRYL ALLERGY) 25 mg capsule Take by mouth.     ferric citrate (AURYXIA) 1 GM 210 MG(Fe) tablet Take 420 mg by mouth 3 (three) times daily with meals.     ibuprofen (ADVIL) 200 MG tablet Take 200-400 mg by mouth every 6 (six) hours as needed for moderate pain.     lidocaine-prilocaine (EMLA) cream Apply 1 application topically daily as needed (prior to port use).     metoprolol tartrate (LOPRESSOR) 25 MG tablet Take 12.5 mg by mouth 2 (two) times daily as needed.  1   midodrine (PROAMATINE) 10 MG tablet Take 10 mg by mouth 3 (three) times daily.     naloxone (NARCAN) nasal spray 4 mg/0.1 mL Place into the nose.     pantoprazole (PROTONIX) 40 MG tablet Take 1 tablet (40 mg total) by mouth daily. 30 tablet 11   pregabalin (LYRICA) 25 MG capsule Take 25 mg by mouth 3 (three) times daily.  sodium chloride (PF) 0.9 % SOLN 1 mL with cefTAZidime 1 mg/mL SOLN 1.25 mg, heparin sodium (porcine) 1000 UNIT/ML SOLN 250 Units Inject into the vein.     vancomycin (VANCOCIN) 750 MG SOLR injection Vancomycin 750 mg IV with each dialysis session (every Monday, Wednesday, Friday) for skin and soft tissue infection left lower extremity.  MRSA positive culture.  Nephrology to dose and adjust per weekly vancomycin troughs and surveillance labs; CBC and BMP.     Zinc Sulfate 220 (50 Zn) MG TABS Take by mouth.     No current facility-administered medications for this visit.   Facility-Administered Medications Ordered in Other Visits  Medication Dose Route Frequency Provider Last Rate Last Admin   sodium chloride flush (NS) 0.9 % injection 10 mL  10 mL Intravenous PRN Nelva Nay C, MD   10 mL at 11/22/20 1314      .  PHYSICAL EXAMINATION: ECOG PERFORMANCE STATUS: 0 - Asymptomatic  Vitals:   11/26/23 1306  BP: 108/60  Pulse: 78   Resp: 18  Temp: (!) 97.1 F (36.2 C)  SpO2: 98%     Filed Weights   11/26/23 1306  Weight: 135 lb (61.2 kg)      Physical Exam Vitals and nursing note reviewed.  Constitutional:      Comments:      HENT:     Head: Normocephalic and atraumatic.     Mouth/Throat:     Pharynx: Oropharynx is clear.  Eyes:     Extraocular Movements: Extraocular movements intact.     Pupils: Pupils are equal, round, and reactive to light.  Cardiovascular:     Rate and Rhythm: Normal rate and regular rhythm.  Pulmonary:     Comments: Decreased breath sounds bilaterally.  Abdominal:     Palpations: Abdomen is soft.  Musculoskeletal:        General: Normal range of motion.     Cervical back: Normal range of motion.  Skin:    General: Skin is warm.  Neurological:     General: No focal deficit present.     Mental Status: She is alert and oriented to person, place, and time.  Psychiatric:        Behavior: Behavior normal.        Judgment: Judgment normal.      LABORATORY DATA:  I have reviewed the data as listed Lab Results  Component Value Date   WBC 8.4 06/15/2022   HGB 13.2 06/15/2022   HCT 42.7 06/15/2022   MCV 78.2 (L) 06/15/2022   PLT 228 06/15/2022   No results for input(s): "NA", "K", "CL", "CO2", "GLUCOSE", "BUN", "CREATININE", "CALCIUM", "GFRNONAA", "GFRAA", "PROT", "ALBUMIN", "AST", "ALT", "ALKPHOS", "BILITOT", "BILIDIR", "IBILI" in the last 8760 hours.   RADIOGRAPHIC STUDIES: I have personally reviewed the radiological images as listed and agreed with the findings in the report. No results found.  ASSESSMENT & PLAN:   Cancer of gallbladder [facility]) # Gallbladder cancer s/p laparoscopic cholecystectomy and liver wedge biopsy by Dr Kathrynn Ducking at [facility] on 04/15/2014.  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  Tumor invaded perimuscular connective tissue with no extension beyond the serosa or into  the liver.  Margins were uninvolved (closest 0.5 mm).  There is no lymphovascular or perineural invasion.  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  One lymph node was negative. Pathologic stage was pT2 pN0. Last CT scan OCT 2021- NEGATIVE for  any recurrence. No further surveillance imaging is recommended- NED   # OCT 3rd, 2024- Previously described lytic lesions are not metabolically active. In the appropriate clinical setting, they could at least in part be degenerative. However, some are not adjacent to joints, inactive or smoldering multiple myeloma could produce a similar appearance with numerous lytic lesions without significant uptake, as well as other poorly avid malignancies. The nodes are not avid as well, but this could again represent a low-grade malignancy with low FDG avidity.  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 HOLD off any further work up.  Continue follow up with Nephrology.   # Hx of Barrett's; with intermittent [solids> liquids]- recommend evaluation with GI.   #OSTEOPOROSIS- 2021- [facility]- [Dr.Westbrrok]- : The T-score is -4.9 and the Z-score is -2.6.   consistent with osteoporosis.  S/p referral to endocrinology - will draw labs today- and moving forward she can have labs drawn with nephology.   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  DCIS was ER and PR positive.  She declined further surgery or radiation. S/p  Armidex. X 5 years [re:Dr. Creig Hines at [facility]]. .AUG 2024- bilateral mammogram- NEG. Stable.   # ESRD- on permacath [M/W/F]- [facility]*; [place].   # PVD/ Multiple graft failure-unclear etiology: Continue Plavix 75  mg at this time.    # Poor IV access- has it flushed every 6-8 weeks. No concerns for any malfunction  *Transportation- # DISPOSITION: # port flush- labs ordered today # port flush every 6-8 weeks-  # move NOV appt to  next [facility] 2026-- MD- in 12  months; port flush; No labs-- Dr.B.     All questions were answered. The patient knows to call the clinic with any problems, questions or concerns.     Earna Coder, MD 11/26/2023 2:08 PM

## 2023-11-26 NOTE — Progress Notes (Signed)
 Pt here today to have labs drawn from an order she received from [facility] to be checked for osteoporosis. I explained to her we are no longer able to except orders from outside offices.

## 2023-11-26 NOTE — Assessment & Plan Note (Addendum)
#   Gallbladder cancer s/p laparoscopic cholecystectomy and liver wedge biopsy by Dr Kathrynn Ducking at [facility] on 04/15/2014.  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  Tumor invaded perimuscular connective tissue with no extension beyond the serosa or into the liver.  Margins were uninvolved (closest 0.5 mm).  There is no lymphovascular or perineural invasion.  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  One lymph node was negative. Pathologic stage was pT2 pN0. Last CT scan OCT 2021- NEGATIVE for any recurrence. No further surveillance imaging is recommended- NED   # OCT 3rd, 2024- Previously described lytic lesions are not metabolically active. In the appropriate clinical setting, they could at least in part be degenerative. However, some are not adjacent to joints, inactive or smoldering multiple myeloma could produce a similar appearance with numerous lytic lesions without significant uptake, as well as other poorly avid malignancies. The nodes are not avid as well, but this could again represent a low-grade malignancy with low FDG avidity.  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 HOLD off any further work up.  Continue follow up with Nephrology.   # Hx of Barrett's; with intermittent [solids> liquids]- recommend evaluation with GI.   #OSTEOPOROSIS- 2021- [facility]- [Dr.Westbrrok]- : The T-score is -4.9 and the Z-score is -2.6.   consistent with osteoporosis.  S/p referral to endocrinology - will draw labs today- and moving forward she can have labs drawn with nephology.   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  DCIS was ER and PR positive.  She declined further surgery or radiation. S/p  Armidex. X 5 years [re:Dr. Creig Hines at [facility]]. .AUG 2024- bilateral mammogram- NEG. Stable.   # ESRD- on permacath [M/W/F]- [facility]*; [place].   # PVD/  Multiple graft failure-unclear etiology: Continue Plavix 75  mg at this time.    # Poor IV access- has it flushed every 6-8 weeks. No concerns for any malfunction  *Transportation- # DISPOSITION: # port flush- labs ordered today # port flush every 6-8 weeks-  # move NOV appt to  next [facility] 2026-- MD- in 12 months; port flush; No labs-- Dr.B.

## 2023-11-26 NOTE — Telephone Encounter (Signed)
 CRITICAL LAB report ; Call from [facility] in lab with a Calcium level of 5.7. Readback. Dr Donneta Romberg notified of result.

## 2023-11-27 LAB — PARATHYROID HORMONE, INTACT (NO CA): PTH: 370 pg/mL — ABNORMAL HIGH (ref 15–65)

## 2023-11-28 ENCOUNTER — Inpatient Hospital Stay: Payer: Medicare Other | Attending: Internal Medicine

## 2023-12-04 ENCOUNTER — Other Ambulatory Visit (INDEPENDENT_AMBULATORY_CARE_PROVIDER_SITE_OTHER): Payer: Self-pay | Admitting: Vascular Surgery

## 2023-12-04 DIAGNOSIS — I739 Peripheral vascular disease, unspecified: Secondary | ICD-10-CM

## 2023-12-04 NOTE — Progress Notes (Signed)
 MRN : [id number]  Connie Olsen is a 78 y.o. ([birth year]) female who presents with chief complaint of check circulation.  History of Present Illness:   The patient returns to the office for followup regarding atherosclerotic changes of the lower extremities and review of the noninvasive studies.   There have been no interval changes in lower extremity symptoms. No interval shortening of the patient's claudication distance or development of rest pain symptoms. No new ulcers or wounds have occurred since the last visit.  There have been no significant changes to the patient's overall health care.  The patient denies amaurosis fugax or recent TIA symptoms. There are no documented recent neurological changes noted. There is no history of DVT, PE or superficial thrombophlebitis. The patient denies recent episodes of angina or shortness of breath.   ABI Rt=[place] (TBI=0.70) and Lt=[place] (TBI=0.63)  (previous ABI's Rt=[place] (TBI=0.84) and Lt=[place] (TBI=0.84)) Duplex ultrasound of the right lower extremity arterial system demonstrates normal velocities with a patent SFA popliteal stent.  No outpatient medications have been marked as taking for the 12/05/23 encounter (Appointment) with Gilda Crease, Latina Craver, MD.    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   Endometrial thickening on ultra sound 04/19/2015   History of ulcerative colitis    History of uterine fibroid    Osteoporosis     Past Surgical History:  Procedure Laterality Date   CHOLECYSTECTOMY     colonoscopy  05/08/2016   DIALYSIS/PERMA CATHETER REPAIR     LAPAROSCOPIC PARTIAL HEPATECTOMY     LOWER EXTREMITY ANGIOGRAPHY Right 04/23/2023   Procedure: Lower Extremity Angiography;  Surgeon: Renford Dills, MD;  Location:  [facility] INVASIVE CV LAB;  Service: Cardiovascular;  Laterality: Right;   MASTECTOMY PARTIAL / LUMPECTOMY     PORTA CATH INSERTION N/A 06/06/2021   Procedure: PORTA CATH INSERTION;  Surgeon: Renford Dills, MD;  Location: [facility] INVASIVE CV LAB;  Service: Cardiovascular;  Laterality: N/A;   UPPER GI ENDOSCOPY  05/08/2016    Social History Social History   Tobacco Use   Smoking status: Never   Smokeless tobacco: Never  Vaping Use   Vaping status: Never Used  Substance Use Topics   Alcohol use: No    Alcohol/week: 0.0 standard drinks of alcohol   Drug use: No    Family History Family History  Problem Relation Age of Onset   Breast cancer Mother     Allergies  Allergen Reactions   Aspirin Other (See Comments)    Ulcerative colitis: UNABLE TO TOLERATE IF NOT ENTERIC COATED   Brassica Napus Seed Oil Other (See Comments)   Naltrexone Other (See Comments)   Librax  [Chlordiazepoxide-Clidinium] Anxiety    agitation   Povidone-Iodine Itching and Rash    Only problem if it is topical, tolerates injected contrast   Tape Itching     REVIEW OF SYSTEMS (Negative unless checked)  Constitutional: [] Weight loss  [] Fever  [] Chills Cardiac: [] Chest pain   []   Chest pressure   [] Palpitations   [] Shortness of breath when laying flat   [] Shortness of breath with exertion. Vascular:  [x] Pain in legs with walking   [] Pain in legs at rest  [] History of DVT   [] Phlebitis   [] Swelling in legs   [] Varicose veins   [] Non-healing ulcers Pulmonary:   [] Uses home oxygen   [] Productive cough   [] Hemoptysis   [] Wheeze  [] COPD   [] Asthma Neurologic:  [] Dizziness   [] Seizures   [] History of stroke   [] History of TIA  [] Aphasia   [] Vissual changes   [] Weakness or numbness in arm   [] Weakness or numbness in leg Musculoskeletal:   [] Joint swelling   [] Joint pain   [] Low back pain Hematologic:  [] Easy bruising  [] Easy bleeding   [] Hypercoagulable state   [] Anemic Gastrointestinal:  [] Diarrhea   [] Vomiting   [] Gastroesophageal reflux/heartburn   [] Difficulty swallowing. Genitourinary:  [] Chronic kidney disease   [] Difficult urination  [] Frequent urination   [] Blood in urine Skin:  [] Rashes   [] Ulcers  Psychological:  [] History of anxiety   []  History of major depression.  Physical Examination  There were no vitals filed for this visit. There is no height or weight on file to calculate BMI. Gen: WD/WN, NAD Head: [place]/AT, No temporalis wasting.  Ear/Nose/Throat: Hearing grossly intact, nares w/o erythema or drainage Eyes: PER, EOMI, sclera nonicteric.  Neck: Supple, no masses.  No bruit or JVD.  Pulmonary:  Good air movement, no audible wheezing, no use of accessory muscles.  Cardiac: RRR, normal S1, S2, no Murmurs. Vascular:  mild trophic changes, no open wounds Vessel Right Left  Radial Palpable Palpable  PT Not Palpable Not Palpable  DP Not Palpable Not Palpable  Gastrointestinal: soft, non-distended. No guarding/no peritoneal signs.  Musculoskeletal: M/S 5/5 throughout.  No visible deformity.  Neurologic: CN 2-12 intact. Pain and light touch intact in extremities.  Symmetrical.  Speech is fluent. Motor exam as listed above. Psychiatric: Judgment intact, Mood & affect appropriate for pt's clinical situation. Dermatologic: No rashes or ulcers noted.  No changes consistent with cellulitis.   CBC Lab Results  Component Value Date   WBC 8.4 06/15/2022   HGB 13.2 06/15/2022   HCT 42.7 06/15/2022   MCV 78.2 (L) 06/15/2022   PLT 228 06/15/2022    BMET    Component Value Date/Time   NA 140 11/26/2023 1346   K 3.8 11/26/2023 1346   CL 101 11/26/2023 1346   CO2 22 11/26/2023 1346   GLUCOSE 84 11/26/2023 1346   BUN 41 (H) 11/26/2023 1346   CREATININE 4.30 (H) 11/26/2023 1346   CALCIUM 5.7 (LL) 11/26/2023 1346   GFRNONAA 10 (L) 11/26/2023 1346   Estimated Creatinine Clearance: 8.9 mL/min (A) (by C-G formula based on SCr of 4.3 mg/dL (H)).  COAG No results found for: "INR",  "PROTIME"  Radiology No results found.   Assessment/Plan 1. Atherosclerosis of native arteries of the extremities with ulceration (HCC) (Primary)  Recommend:  The patient has evidence of atherosclerosis of the lower extremities with claudication.  The patient does not voice lifestyle limiting changes at this point in time.  Noninvasive studies do not suggest clinically significant change.  No invasive studies, angiography or surgery at this time The patient should continue walking and begin a more formal exercise program.  The patient should continue antiplatelet therapy and aggressive treatment of the lipid abnormalities  No changes in the patient's medications at this time  Continued surveillance is indicated as atherosclerosis is likely  to progress with time.    The patient will continue follow up with noninvasive studies as ordered.  - VAS [place] LOWER EXTREMITY ARTERIAL DUPLEX; Future - VAS [place] ABI WITH/WO TBI; Future  2. Primary hypertension Continue antihypertensive medications as already ordered, these medications have been reviewed and there are no changes at this time.  3. Atrial fibrillation, unspecified type (HCC) Continue antiarrhythmia medications as already ordered, these medications have been reviewed and there are no changes at this time.  Continue anticoagulation as ordered by Cardiology Service  4. Type 2 diabetes mellitus with other diabetic kidney complication (HCC) Continue hypoglycemic medications as already ordered, these medications have been reviewed and there are no changes at this time.  Hgb A1C to be monitored as already arranged by primary service      [name], MD  12/04/2023 4:14 PM

## 2023-12-05 ENCOUNTER — Ambulatory Visit (INDEPENDENT_AMBULATORY_CARE_PROVIDER_SITE_OTHER): Payer: Medicare Other | Admitting: Vascular Surgery

## 2023-12-05 ENCOUNTER — Encounter (INDEPENDENT_AMBULATORY_CARE_PROVIDER_SITE_OTHER): Payer: Self-pay | Admitting: Vascular Surgery

## 2023-12-05 ENCOUNTER — Inpatient Hospital Stay: Payer: Medicare Other

## 2023-12-05 ENCOUNTER — Ambulatory Visit (INDEPENDENT_AMBULATORY_CARE_PROVIDER_SITE_OTHER): Payer: Medicare Other

## 2023-12-05 VITALS — BP 137/82 | HR 71 | Resp 18 | Ht 59.0 in | Wt 135.0 lb

## 2023-12-05 DIAGNOSIS — E1129 Type 2 diabetes mellitus with other diabetic kidney complication: Secondary | ICD-10-CM

## 2023-12-05 DIAGNOSIS — I4891 Unspecified atrial fibrillation: Secondary | ICD-10-CM

## 2023-12-05 DIAGNOSIS — I7025 Atherosclerosis of native arteries of other extremities with ulceration: Secondary | ICD-10-CM | POA: Diagnosis not present

## 2023-12-05 DIAGNOSIS — Z9889 Other specified postprocedural states: Secondary | ICD-10-CM | POA: Diagnosis not present

## 2023-12-05 DIAGNOSIS — I739 Peripheral vascular disease, unspecified: Secondary | ICD-10-CM

## 2023-12-05 DIAGNOSIS — I10 Essential (primary) hypertension: Secondary | ICD-10-CM | POA: Diagnosis not present

## 2023-12-05 DIAGNOSIS — N186 End stage renal disease: Secondary | ICD-10-CM

## 2023-12-05 LAB — VAS US ABI WITH/WO TBI

## 2023-12-08 ENCOUNTER — Encounter (INDEPENDENT_AMBULATORY_CARE_PROVIDER_SITE_OTHER): Payer: Self-pay | Admitting: Vascular Surgery

## 2024-01-14 ENCOUNTER — Inpatient Hospital Stay: Attending: Internal Medicine

## 2024-01-16 ENCOUNTER — Inpatient Hospital Stay: Payer: Medicare Other | Attending: Internal Medicine

## 2024-01-21 ENCOUNTER — Encounter (INDEPENDENT_AMBULATORY_CARE_PROVIDER_SITE_OTHER): Payer: Self-pay

## 2024-02-27 ENCOUNTER — Inpatient Hospital Stay: Payer: Medicare Other | Attending: Internal Medicine

## 2024-03-03 ENCOUNTER — Inpatient Hospital Stay: Attending: Internal Medicine

## 2024-03-26 ENCOUNTER — Ambulatory Visit: Payer: Medicare Other | Admitting: Internal Medicine

## 2024-04-05 IMAGING — CR DG CHEST 2V
2 series · 2 of 2 positions shown · non-contrast
Comparison: Chest x-ray 12/06/2020

CLINICAL DATA: Cough

EXAM:
CHEST - 2 VIEW

[chest pa]
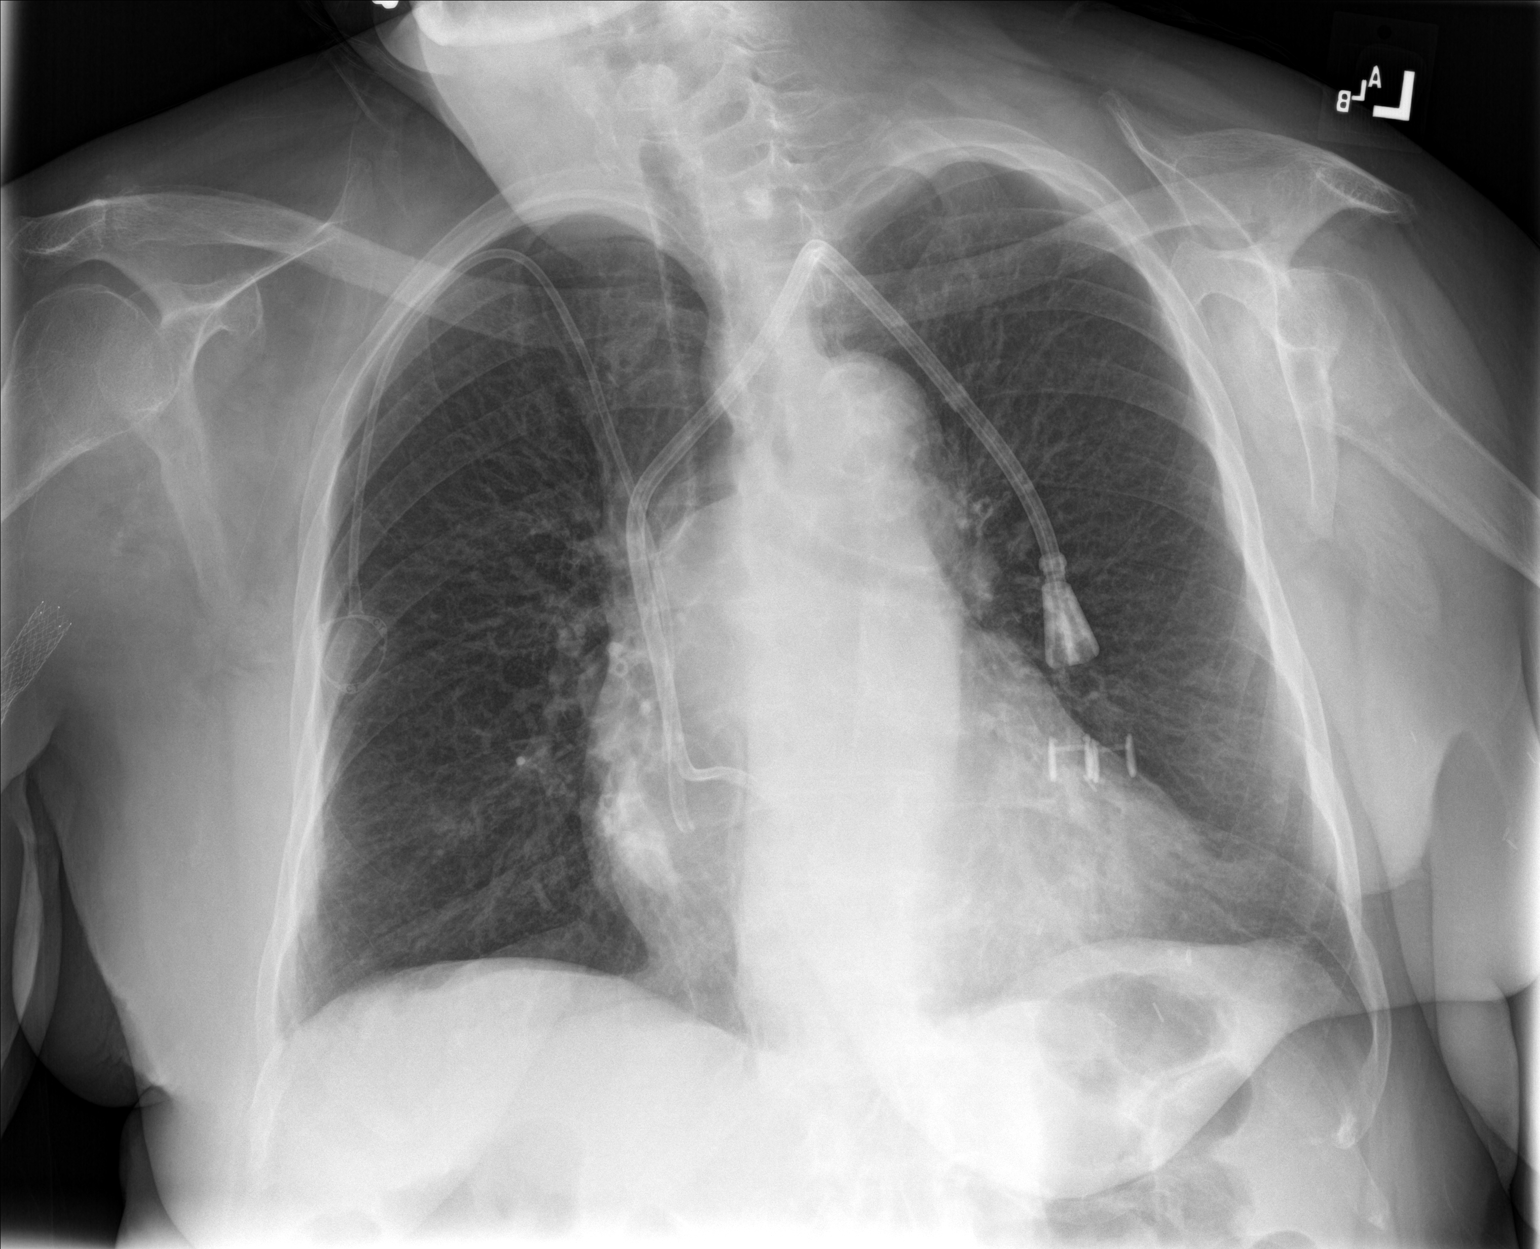

[chest lat]
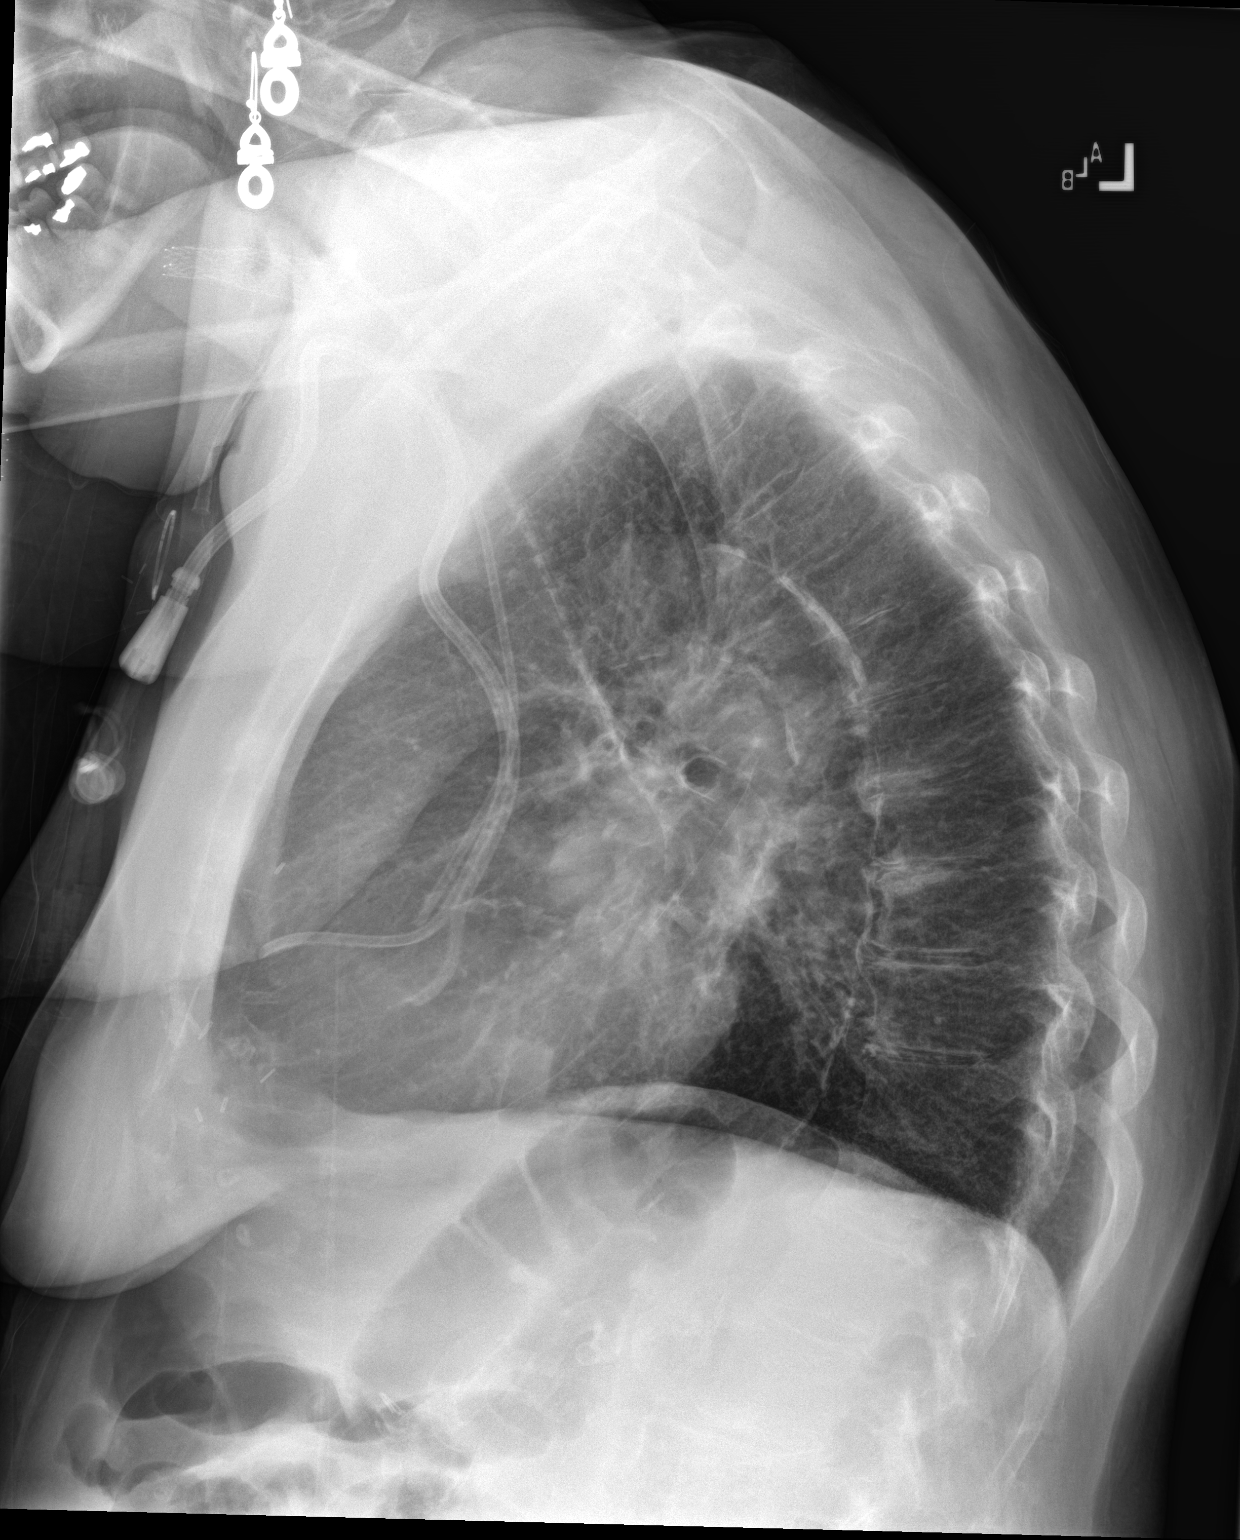

[2 of 2 positions shown; findings below may reference images not displayed]

FINDINGS: Cardiomediastinal silhouette is stable. Calcified plaques in the
aortic arch. Right-sided central venous port and left-sided central
line. Pulmonary vasculature is normal. No focal consolidation,
pleural effusion, or pneumothorax identified.
IMPRESSION: No acute intrathoracic process identified.

## 2024-04-21 ENCOUNTER — Inpatient Hospital Stay: Attending: Internal Medicine

## 2024-05-16 ENCOUNTER — Emergency Department

## 2024-05-16 ENCOUNTER — Inpatient Hospital Stay
Admission: EM | Admit: 2024-05-16 | Discharge: 2024-05-21 | DRG: 640 | Disposition: A | Discharge status: Skilled Nursing Facility | Attending: Internal Medicine | Admitting: Internal Medicine

## 2024-05-16 ENCOUNTER — Other Ambulatory Visit: Payer: Self-pay

## 2024-05-16 DIAGNOSIS — Z635 Disruption of family by separation and divorce: Secondary | ICD-10-CM

## 2024-05-16 DIAGNOSIS — D0512 Intraductal carcinoma in situ of left breast: Secondary | ICD-10-CM | POA: Diagnosis present

## 2024-05-16 DIAGNOSIS — E860 Dehydration: Secondary | ICD-10-CM | POA: Diagnosis present

## 2024-05-16 DIAGNOSIS — G8929 Other chronic pain: Secondary | ICD-10-CM | POA: Diagnosis present

## 2024-05-16 DIAGNOSIS — Z886 Allergy status to analgesic agent status: Secondary | ICD-10-CM

## 2024-05-16 DIAGNOSIS — T50995A Adverse effect of other drugs, medicaments and biological substances, initial encounter: Secondary | ICD-10-CM | POA: Diagnosis present

## 2024-05-16 DIAGNOSIS — L89152 Pressure ulcer of sacral region, stage 2: Secondary | ICD-10-CM | POA: Diagnosis present

## 2024-05-16 DIAGNOSIS — S0990XA Unspecified injury of head, initial encounter: Secondary | ICD-10-CM

## 2024-05-16 DIAGNOSIS — N2581 Secondary hyperparathyroidism of renal origin: Secondary | ICD-10-CM | POA: Diagnosis present

## 2024-05-16 DIAGNOSIS — Z66 Do not resuscitate: Secondary | ICD-10-CM | POA: Diagnosis present

## 2024-05-16 DIAGNOSIS — I120 Hypertensive chronic kidney disease with stage 5 chronic kidney disease or end stage renal disease: Principal | ICD-10-CM | POA: Diagnosis present

## 2024-05-16 DIAGNOSIS — C23 Malignant neoplasm of gallbladder: Secondary | ICD-10-CM | POA: Diagnosis present

## 2024-05-16 DIAGNOSIS — L97919 Non-pressure chronic ulcer of unspecified part of right lower leg with unspecified severity: Secondary | ICD-10-CM | POA: Diagnosis present

## 2024-05-16 DIAGNOSIS — R5381 Other malaise: Secondary | ICD-10-CM | POA: Diagnosis present

## 2024-05-16 DIAGNOSIS — Z95828 Presence of other vascular implants and grafts: Secondary | ICD-10-CM

## 2024-05-16 DIAGNOSIS — Z79899 Other long term (current) drug therapy: Secondary | ICD-10-CM

## 2024-05-16 DIAGNOSIS — Z86000 Personal history of in-situ neoplasm of breast: Secondary | ICD-10-CM

## 2024-05-16 DIAGNOSIS — S60221A Contusion of right hand, initial encounter: Secondary | ICD-10-CM | POA: Diagnosis present

## 2024-05-16 DIAGNOSIS — Z7902 Long term (current) use of antithrombotics/antiplatelets: Secondary | ICD-10-CM

## 2024-05-16 DIAGNOSIS — Z888 Allergy status to other drugs, medicaments and biological substances status: Secondary | ICD-10-CM

## 2024-05-16 DIAGNOSIS — Z9181 History of falling: Secondary | ICD-10-CM

## 2024-05-16 DIAGNOSIS — I739 Peripheral vascular disease, unspecified: Secondary | ICD-10-CM | POA: Diagnosis present

## 2024-05-16 DIAGNOSIS — I4891 Unspecified atrial fibrillation: Secondary | ICD-10-CM | POA: Diagnosis present

## 2024-05-16 DIAGNOSIS — E1122 Type 2 diabetes mellitus with diabetic chronic kidney disease: Secondary | ICD-10-CM | POA: Diagnosis present

## 2024-05-16 DIAGNOSIS — E876 Hypokalemia: Secondary | ICD-10-CM | POA: Diagnosis present

## 2024-05-16 DIAGNOSIS — Z9013 Acquired absence of bilateral breasts and nipples: Secondary | ICD-10-CM

## 2024-05-16 DIAGNOSIS — R531 Weakness: Principal | ICD-10-CM

## 2024-05-16 DIAGNOSIS — Z993 Dependence on wheelchair: Secondary | ICD-10-CM

## 2024-05-16 DIAGNOSIS — S60222A Contusion of left hand, initial encounter: Secondary | ICD-10-CM | POA: Diagnosis present

## 2024-05-16 DIAGNOSIS — D631 Anemia in chronic kidney disease: Secondary | ICD-10-CM | POA: Diagnosis present

## 2024-05-16 DIAGNOSIS — Z91048 Other nonmedicinal substance allergy status: Secondary | ICD-10-CM

## 2024-05-16 DIAGNOSIS — E1151 Type 2 diabetes mellitus with diabetic peripheral angiopathy without gangrene: Secondary | ICD-10-CM | POA: Diagnosis present

## 2024-05-16 DIAGNOSIS — L97929 Non-pressure chronic ulcer of unspecified part of left lower leg with unspecified severity: Secondary | ICD-10-CM | POA: Diagnosis present

## 2024-05-16 DIAGNOSIS — W19XXXA Unspecified fall, initial encounter: Secondary | ICD-10-CM | POA: Diagnosis not present

## 2024-05-16 DIAGNOSIS — W050XXA Fall from non-moving wheelchair, initial encounter: Secondary | ICD-10-CM | POA: Diagnosis present

## 2024-05-16 DIAGNOSIS — I482 Chronic atrial fibrillation, unspecified: Secondary | ICD-10-CM | POA: Diagnosis present

## 2024-05-16 DIAGNOSIS — G894 Chronic pain syndrome: Secondary | ICD-10-CM | POA: Diagnosis present

## 2024-05-16 DIAGNOSIS — Z992 Dependence on renal dialysis: Secondary | ICD-10-CM

## 2024-05-16 DIAGNOSIS — Y92009 Unspecified place in unspecified non-institutional (private) residence as the place of occurrence of the external cause: Secondary | ICD-10-CM | POA: Diagnosis not present

## 2024-05-16 DIAGNOSIS — E785 Hyperlipidemia, unspecified: Secondary | ICD-10-CM | POA: Diagnosis present

## 2024-05-16 DIAGNOSIS — E1129 Type 2 diabetes mellitus with other diabetic kidney complication: Secondary | ICD-10-CM | POA: Diagnosis present

## 2024-05-16 DIAGNOSIS — I9589 Other hypotension: Secondary | ICD-10-CM | POA: Diagnosis present

## 2024-05-16 DIAGNOSIS — Z8719 Personal history of other diseases of the digestive system: Secondary | ICD-10-CM

## 2024-05-16 DIAGNOSIS — K219 Gastro-esophageal reflux disease without esophagitis: Secondary | ICD-10-CM | POA: Diagnosis present

## 2024-05-16 DIAGNOSIS — N186 End stage renal disease: Secondary | ICD-10-CM | POA: Diagnosis present

## 2024-05-16 DIAGNOSIS — Z803 Family history of malignant neoplasm of breast: Secondary | ICD-10-CM

## 2024-05-16 LAB — COMPREHENSIVE METABOLIC PANEL WITH GFR
ALT: <5 U/L (ref 0–44)
AST: 33 U/L (ref 15–41)
Albumin: 3 g/dL — ABNORMAL LOW (ref 3.5–5.0)
Alkaline Phosphatase: 196 U/L — ABNORMAL HIGH (ref 38–126)
Anion gap: 20 — ABNORMAL HIGH (ref 5–15)
BUN: 13 mg/dL (ref 8–23)
CO2: 22 mmol/L (ref 22–32)
Calcium: 6.1 mg/dL — CL (ref 8.9–10.3)
Chloride: 102 mmol/L (ref 98–111)
Creatinine, Ser: 2.68 mg/dL — ABNORMAL HIGH (ref 0.44–1.00)
GFR, Estimated: 18 mL/min — ABNORMAL LOW (ref >60)
Glucose, Bld: 101 mg/dL — ABNORMAL HIGH (ref 70–99)
Potassium: 2.5 mmol/L — CL (ref 3.5–5.1)
Sodium: 144 mmol/L (ref 135–145)
Total Bilirubin: 1.4 mg/dL — ABNORMAL HIGH (ref 0.0–1.2)
Total Protein: 6.5 g/dL (ref 6.5–8.1)

## 2024-05-16 LAB — CBC WITH DIFFERENTIAL/PLATELET
Abs Immature Granulocytes: 0.2 K/uL — ABNORMAL HIGH (ref 0.00–0.07)
Basophils Absolute: 0.1 K/uL (ref 0.0–0.1)
Basophils Relative: 1 %
Eosinophils Absolute: 0.1 K/uL (ref 0.0–0.5)
Eosinophils Relative: 1 %
HCT: 35.1 % — ABNORMAL LOW (ref 36.0–46.0)
Hemoglobin: 10.9 g/dL — ABNORMAL LOW (ref 12.0–15.0)
Immature Granulocytes: 2 %
Lymphocytes Relative: 4 %
Lymphs Abs: 0.4 K/uL — ABNORMAL LOW (ref 0.7–4.0)
MCH: 26.4 pg (ref 26.0–34.0)
MCHC: 31.1 g/dL (ref 30.0–36.0)
MCV: 85 fL (ref 80.0–100.0)
Monocytes Absolute: 1.1 K/uL — ABNORMAL HIGH (ref 0.1–1.0)
Monocytes Relative: 10 %
Neutro Abs: 8.7 K/uL — ABNORMAL HIGH (ref 1.7–7.7)
Neutrophils Relative %: 82 %
Platelets: 264 K/uL (ref 150–400)
RBC: 4.13 MIL/uL (ref 3.87–5.11)
RDW: 22 % — ABNORMAL HIGH (ref 11.5–15.5)
Smear Review: NORMAL
WBC: 10.5 K/uL (ref 4.0–10.5)
nRBC: 0.3 % — ABNORMAL HIGH (ref 0.0–0.2)

## 2024-05-16 LAB — TROPONIN I (HIGH SENSITIVITY)
Troponin I (High Sensitivity): 41 ng/L — ABNORMAL HIGH (ref <18)
Troponin I (High Sensitivity): 43 ng/L — ABNORMAL HIGH (ref <18)

## 2024-05-16 LAB — MAGNESIUM: Magnesium: 1.5 mg/dL — ABNORMAL LOW (ref 1.7–2.4)

## 2024-05-16 MED ORDER — MIDODRINE HCL 5 MG PO TABS
10.0000 mg | ORAL_TABLET | Freq: Once | ORAL | Status: AC
  Administered 2024-05-16: 10 mg via ORAL
  Filled 2024-05-16: qty 2

## 2024-05-16 MED ORDER — FENTANYL CITRATE PF 50 MCG/ML IJ SOSY
50.0000 ug | PREFILLED_SYRINGE | Freq: Once | INTRAMUSCULAR | Status: AC
  Administered 2024-05-16: 50 ug via INTRAVENOUS
  Filled 2024-05-16: qty 1

## 2024-05-16 MED ORDER — ACETAMINOPHEN 325 MG PO TABS
650.0000 mg | ORAL_TABLET | Freq: Four times a day (QID) | ORAL | Status: DC | PRN
  Administered 2024-05-17: 650 mg via ORAL
  Filled 2024-05-16: qty 2

## 2024-05-16 MED ORDER — ENSURE PLUS HIGH PROTEIN PO LIQD
237.0000 mL | Freq: Two times a day (BID) | ORAL | Status: AC
Start: 2024-05-17 — End: ?
  Administered 2024-05-18: 237 mL via ORAL

## 2024-05-16 MED ORDER — POLYETHYLENE GLYCOL 3350 17 G PO PACK: 17.0000 g | PACK | Freq: Every day | ORAL | Status: DC | PRN

## 2024-05-16 MED ORDER — FERRIC CITRATE 1 GM 210 MG(FE) PO TABS
420.0000 mg | ORAL_TABLET | Freq: Three times a day (TID) | ORAL | Status: DC
Start: 2024-05-16 — End: 2024-05-16

## 2024-05-16 MED ORDER — CINACALCET HCL 30 MG PO TABS
90.0000 mg | ORAL_TABLET | Freq: Every day | ORAL | Status: DC
Start: 2024-05-16 — End: 2024-05-18
  Administered 2024-05-16 – 2024-05-17 (×2): 90 mg via ORAL
  Filled 2024-05-16 (×2): qty 3

## 2024-05-16 MED ORDER — ACETAMINOPHEN 650 MG RE SUPP: 650.0000 mg | Freq: Four times a day (QID) | RECTAL | Status: DC | PRN

## 2024-05-16 MED ORDER — PANTOPRAZOLE SODIUM 40 MG PO TBEC
40.0000 mg | DELAYED_RELEASE_TABLET | Freq: Every day | ORAL | Status: DC
  Administered 2024-05-16 – 2024-05-21 (×5): 40 mg via ORAL
  Filled 2024-05-16 (×5): qty 1

## 2024-05-16 MED ORDER — CLOPIDOGREL BISULFATE 75 MG PO TABS
75.0000 mg | ORAL_TABLET | Freq: Every day | ORAL | Status: DC
Start: 2024-05-16 — End: 2024-05-21
  Administered 2024-05-17 – 2024-05-21 (×5): 75 mg via ORAL
  Filled 2024-05-16 (×6): qty 1

## 2024-05-16 MED ORDER — CHLORHEXIDINE GLUCONATE CLOTH 2 % EX PADS
6.0000 | MEDICATED_PAD | Freq: Every day | CUTANEOUS | Status: DC
  Administered 2024-05-16 – 2024-05-21 (×6): 6 via TOPICAL

## 2024-05-16 MED ORDER — BUPRENORPHINE 10 MCG/HR TD PTWK
1.0000 | MEDICATED_PATCH | TRANSDERMAL | Status: DC
  Administered 2024-05-17: 1 via TRANSDERMAL
  Filled 2024-05-16: qty 1

## 2024-05-16 MED ORDER — ONDANSETRON HCL 4 MG/2ML IJ SOLN: 4.0000 mg | Freq: Four times a day (QID) | INTRAMUSCULAR | Status: DC | PRN

## 2024-05-16 MED ORDER — ONDANSETRON HCL 4 MG PO TABS: 4.0000 mg | ORAL_TABLET | Freq: Four times a day (QID) | ORAL | Status: DC | PRN

## 2024-05-16 MED ORDER — MIDODRINE HCL 5 MG PO TABS
10.0000 mg | ORAL_TABLET | Freq: Three times a day (TID) | ORAL | Status: DC
  Administered 2024-05-16 – 2024-05-21 (×14): 10 mg via ORAL
  Filled 2024-05-16 (×15): qty 2

## 2024-05-16 MED ORDER — HEPARIN SODIUM (PORCINE) 5000 UNIT/ML IJ SOLN
5000.0000 [IU] | Freq: Three times a day (TID) | INTRAMUSCULAR | Status: DC
  Administered 2024-05-16: 5000 [IU] via SUBCUTANEOUS
  Filled 2024-05-16 (×6): qty 1

## 2024-05-16 MED ORDER — SODIUM CHLORIDE 0.9 % IV BOLUS
500.0000 mL | Freq: Once | INTRAVENOUS | Status: AC
  Administered 2024-05-16: 500 mL via INTRAVENOUS

## 2024-05-16 MED ORDER — SODIUM CHLORIDE 0.9% FLUSH: 10.0000 mL | INTRAVENOUS | Status: DC | PRN

## 2024-05-16 MED ORDER — MAGNESIUM SULFATE 2 GM/50ML IV SOLN
2.0000 g | Freq: Once | INTRAVENOUS | Status: AC
  Administered 2024-05-16: 2 g via INTRAVENOUS
  Filled 2024-05-16: qty 50

## 2024-05-16 MED ORDER — SODIUM CHLORIDE 0.9% FLUSH
10.0000 mL | Freq: Two times a day (BID) | INTRAVENOUS | Status: DC
  Administered 2024-05-16 – 2024-05-21 (×9): 10 mL

## 2024-05-16 MED ORDER — CALCIUM GLUCONATE-NACL 1-0.675 GM/50ML-% IV SOLN
1.0000 g | Freq: Once | INTRAVENOUS | Status: AC
Start: 2024-05-16 — End: 2024-05-16
  Administered 2024-05-16: 1000 mg via INTRAVENOUS
  Filled 2024-05-16: qty 50

## 2024-05-16 MED ORDER — POTASSIUM CHLORIDE 10 MEQ/100ML IV SOLN
10.0000 meq | INTRAVENOUS | Status: AC
  Administered 2024-05-16 (×2): 10 meq via INTRAVENOUS
  Filled 2024-05-16 (×2): qty 100

## 2024-05-16 MED ORDER — METOPROLOL TARTRATE 25 MG PO TABS
12.5000 mg | ORAL_TABLET | Freq: Two times a day (BID) | ORAL | Status: DC
Start: 2024-05-16 — End: 2024-05-19
  Administered 2024-05-19: 12.5 mg via ORAL
  Filled 2024-05-16 (×3): qty 1

## 2024-05-16 NOTE — Progress Notes (Signed)
 PT Cancellation Note  Patient Details Name: Connie Olsen MRN: [id number] DOB: 1946/07/01   Cancelled Treatment:    Reason Eval/Treat Not Completed: Patient declined, no reason specified (Consult received and chart reviewed.  Patient currently mid-lunch, generally frustrated with interruption.  Assisted with repositioning in bed (max assist) and set up to finish lunch.  Will continue efforts to initiate/complete eval at later time/date as medically appropriate and available.)   Malek Skog H. Delores, PT, DPT, NCS 05/16/24, 2:21 PM [phone]

## 2024-05-16 NOTE — ED Notes (Signed)
 BIB [facility] from home for low BP. Pt fell out of her wheelchair forward. Pt has been home 6 weeks from [facility]. Pt has pressure sores on bottom from 15 mins of sitting on the floor. Pt screams out in pain everytime EMS touched her. Pt is caox4.   71/44 BP Only take BP on forearm.  Dialysis pt on Friday and was normal.

## 2024-05-16 NOTE — ED Provider Notes (Signed)
 [facility] Provider Note    Event Date/Time   First MD Initiated Contact with Patient 05/16/24 [phone]     (approximate)   History   pain all over   HPI  Connie Olsen is a 78 year old female with history of ESRD on HD, T2DM, GI bleed presenting to the emergency department after a fall.  Patient reports that she has felt generally weak over the past few days but this is not new for her.  She fell asleep in her wheelchair yesterday which she believes caused her to fall out of her wheelchair and hit her head.  No LOC. Not on anticoagulation for Afib. Reports pain all over her body.  Reports she completed dialysis yesterday, feels dehydrated.  Did not take her evening dose of midodrine  yesterday.  I reviewed her discharge summary from 01/31/2024 in the [facility] system.  At that time patient presented with hematochezia with presumed diverticular bleed.  She was noted to have hypotension during her hospitalization that was thought to be related to poor vascular, inaccurate readings due to significant vascular disease and prior AV fistula.  Is on home midodrine .    Physical Exam   Triage Vital Signs: ED Triage Vitals  Encounter Vitals Group     BP 05/16/24 0050 (!) 90/49     Girls Systolic BP Percentile --      Girls Diastolic BP Percentile --      Boys Systolic BP Percentile --      Boys Diastolic BP Percentile --      Pulse Rate 05/16/24 0050 69     Resp 05/16/24 0050 16     Temp 05/16/24 0050 98.5 F (36.9 C)     Temp Source 05/16/24 0050 Oral     SpO2 05/16/24 0050 100 %     Weight 05/16/24 0050 140 lb (63.5 kg)     Height 05/16/24 0050 4' 11.25 (1.505 m)     Head Circumference --      Peak Flow --      Pain Score 05/16/24 0054 10     Pain Loc --      Pain Education --      Exclude from Growth Chart --     Most recent vital signs: Vitals:   05/16/24 0930 05/16/24 1005  BP: 106/87 90/61  Pulse: (!) 46 (!) 104  Resp: (!) 25 18  Temp:    SpO2:  99% 98%    Nursing notes and vital signs reviewed.  General: Adult female, lying in bed, awake and interactive Head: Atraumatic Chest: Symmetric chest rise, generalized tenderness Cardiac: Tachycardic with irregularly irregular rhythm Respiratory: Lungs clear to auscultation Abdomen: Soft, nondistended.  Mild generalized tenderness to palpation.  MSK: No deformity to bilateral upper and lower extremity. Full range of motion to bilateral upper lower extremity. Neuro: Alert, oriented. GCS 15.  Skin: No evidence of burns or lacerations.  Left chest wall HD catheter on surrounding erythema or drainage.  ED Results / Procedures / Treatments   Labs (all labs ordered are listed, but only abnormal results are displayed) Labs Reviewed  CBC WITH DIFFERENTIAL/PLATELET - Abnormal; Notable for the following components:      Result Value   Hemoglobin 10.9 (*)    HCT 35.1 (*)    RDW 22.0 (*)    nRBC 0.3 (*)    Neutro Abs 8.7 (*)    Lymphs Abs 0.4 (*)    Monocytes Absolute 1.1 (*)    Abs  Immature Granulocytes 0.20 (*)    All other components within normal limits  COMPREHENSIVE METABOLIC PANEL WITH GFR - Abnormal; Notable for the following components:   Potassium 2.5 (*)    Glucose, Bld 101 (*)    Creatinine, Ser 2.68 (*)    Calcium  6.1 (*)    Albumin 3.0 (*)    Alkaline Phosphatase 196 (*)    Total Bilirubin 1.4 (*)    GFR, Estimated 18 (*)    Anion gap 20 (*)    All other components within normal limits  MAGNESIUM  - Abnormal; Notable for the following components:   Magnesium  1.5 (*)    All other components within normal limits  TROPONIN I (HIGH SENSITIVITY) - Abnormal; Notable for the following components:   Troponin I (High Sensitivity) 41 (*)    All other components within normal limits  URINALYSIS, W/ REFLEX TO CULTURE (INFECTION SUSPECTED)  TROPONIN I (HIGH SENSITIVITY)     EKG EKG independently reviewed and interpreted by myself demonstrates:  EKG demonstrates A-fib at a  rate of 109, QRS 95, QTc 430, nonspecific ST changes  RADIOLOGY Imaging independently reviewed and interpreted by myself demonstrates:  CT head without acute bleed CT C-spine without acute fracture CT CAP without acute traumatic injury  Formal Radiology Read:  CT Cervical Spine Wo Contrast Result Date: 05/16/2024 CLINICAL DATA:  78 year old female status post fall from wheelchair, hypotension, dialysis patient. Headache. EXAM: CT CERVICAL SPINE WITHOUT CONTRAST TECHNIQUE: Multidetector CT imaging of the cervical spine was performed without intravenous contrast. Multiplanar CT image reconstructions were also generated. RADIATION DOSE REDUCTION: This exam was performed according to the departmental dose-optimization program which includes automated exposure control, adjustment of the mA and/or kV according to patient size and/or use of iterative reconstruction technique. COMPARISON:  Head CT today.  Cervical spine radiographs 06/16/2019. FINDINGS: Alignment: Straightening of cervical lordosis. Cervicothoracic junction alignment is within normal limits. Mild degenerative anterolisthesis of C4 on C5, C3 on C4. See additional details of those levels below. Maintained posterior element alignment. 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 Visualized skull base is intact. No atlanto-occipital dissociation. C1 and C2 are degenerated anteriorly, but appear intact and aligned. No acute osseous abnormality identified. Soft tissues and spinal canal: No prevertebral fluid or swelling. No visible canal hematoma. Advanced calcified atherosclerosis. Bilateral thoracic inlet vascular catheters. Coarsely calcified left thyroid nodule (no follow-up imaging recommended). Otherwise negative visible noncontrast neck soft tissues. Disc levels: Degenerative ankylosis of C3-C4 and C4-C5 facets. Degenerative Interbody ankylosis at C4-C5, C5-C6, and likely also C6-C7. Chronic disc, endplate, and facet degeneration at  those levels. Disc and posterior element degeneration also at the unfused C2-C3 level. Mild spinal stenosis suspected there. Upper chest: Chest CT today reported separately. IMPRESSION: 1. No acute traumatic injury identified in the cervical spine. 2. 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 Mild multifactorial spinal stenosis suspected at C2-C3. Electronically Signed   By: VEAR Hurst M.D.   On: 05/16/2024 09:50   CT CHEST ABDOMEN PELVIS WO CONTRAST Result Date: 05/16/2024 CLINICAL DATA:  78 year old female status post fall from wheelchair, hypotension, dialysis patient. Headache. EXAM: CT CHEST, ABDOMEN AND PELVIS WITHOUT CONTRAST TECHNIQUE: Multidetector CT imaging of the chest, abdomen and pelvis was performed following the standard protocol without IV contrast. RADIATION DOSE REDUCTION: This exam was performed according to the departmental dose-optimization program which includes automated exposure control, adjustment of the mA and/or kV according to patient size and/or use of iterative reconstruction technique. COMPARISON:  Noncontrast CT Abdomen and Pelvis 06/15/2022. FINDINGS: CT CHEST FINDINGS Cardiovascular: Left chest dual lumen dialysis type catheter. Right chest Port-A-Cath. Calcified aortic atherosclerosis. Calcified coronary artery atherosclerosis and/or stents. Mild cardiomegaly, mostly biatrial enlargement. No significant pericardial effusion; there is likely chronic pericardial fluid in a sub carina recess, partially visible in 2023 also. Vascular patency is not evaluated in the absence of IV contrast. Mediastinum/Nodes: No evidence of mediastinal hematoma, mass, lymphadenopathy on this noncontrast exam. Lungs/Pleura: Major airways are patent. Increased elevation of the right hemidiaphragm and confluent right lower lobe compressive atelectasis with mild air bronchograms since 2023. No superimposed pleural effusion. No pneumothorax. No  pulmonary edema. Calcified subpleural left upper lobe granuloma on series 4, image 27 (no follow-up imaging recommended). Subcentimeter left lower lobe subpleural nodule also stable, series 4, image 21 previously (no follow-up imaging recommended). Scattered subpleural lung scarring otherwise. Musculoskeletal: Chronic severe degeneration at the shoulders, greater on the left. 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 Hyperostosis in the thoracic spine with multilevel interbody ankylosis. Maintained thoracic vertebral height. Chronic and healing bilateral rib fractures, including left lateral ribs 5 through 7, posterior ribs 10 and 11 bilaterally. No acute rib fracture is identified. CT ABDOMEN PELVIS FINDINGS Hepatobiliary: Stable and negative noncontrast liver. Diminutive or absent gallbladder. Pancreas: Partially atrophied. Spleen: Negative. Adrenals/Urinary Tract: Chronic native renal atrophy. Exophytic simple fluid density right upper pole cyst (no follow-up imaging recommended). Negative adrenal glands. Decompressed bladder. Stomach/Bowel: Nondilated large and small bowel loops. Descending and sigmoid colon diverticulosis. Less pronounced transverse colon diverticulosis. Redundant hepatic flexure. Small chronic fat containing ventral abdominal hernia is stable since 2023. Small volume retained fluid in the stomach and duodenum. No pneumoperitoneum, free fluid, or mesenteric inflammation identified. Vascular/Lymphatic: Severe Aortoiliac calcified atherosclerosis. Normal caliber abdominal aorta. Vascular patency is not evaluated in the absence of IV contrast. No lymphadenopathy identified. Reproductive: Small calcified uterine fibroids. Otherwise negative noncontrast appearance. Other: No pelvis free fluid. Musculoskeletal: Renal osteodystrophy. Moderate chronic levoconvex lumbar scoliosis. Stable vertebral height since 2023. Chronic but increased bulky subchondral cysts at the right hip joint. No acute fracture  or dislocation identified. IMPRESSION: 1. No acute traumatic injury identified in the noncontrast chest, abdomen, or pelvis. 2. Chronic and healing bilateral rib fractures. Underlying renal osteodystrophy. 3. Increased right hemidiaphragm elevation and right lower lobe atelectasis since 2023. 4. Advanced calcified atherosclerosis. Aortic Atherosclerosis (ICD10-I70.0). Electronically Signed   By: VEAR Hurst M.D.   On: 05/16/2024 08:51   CT Head Wo Contrast Result Date: 05/16/2024 CLINICAL DATA:  78 year old female status post fall from wheelchair, hypotension, dialysis patient. Headache. EXAM: CT HEAD WITHOUT CONTRAST TECHNIQUE: Contiguous axial images were obtained from the base of the skull through the vertex without intravenous contrast. RADIATION DOSE REDUCTION: This exam was performed according to the departmental dose-optimization program which includes automated exposure control, adjustment of the mA and/or kV according to patient size and/or use of iterative reconstruction technique. COMPARISON:  Head CT 06/15/2022. FINDINGS: Brain: Chronic encephalomalacia lateral left parietooccipital lobe junction, stable since 2023. Stable cerebral volume. No midline shift, ventriculomegaly, mass effect, evidence of mass lesion, intracranial hemorrhage or evidence of cortically based acute infarction. Maintained gray-white differentiation elsewhere. Vascular: Advanced Calcified atherosclerosis at the skull base. No suspicious intracranial vascular hyperdensity. Skull: Stable, intact. Sinuses/Orbits: Visualized paranasal sinuses and mastoids are stable and well aerated. Other: Calcified scalp vessel atherosclerosis. No acute orbit or scalp soft tissue injury identified. Postoperative changes to the right globe since 2023. IMPRESSION: 1. No acute intracranial abnormality or acute traumatic injury identified.  2. 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 3. Advanced calcified  atherosclerosis. Electronically Signed   By: VEAR Hurst M.D.   On: 05/16/2024 08:40    PROCEDURES:  Critical Care performed: Yes, see critical care procedure note(s)  CRITICAL CARE Performed by: Nilsa Dade   Total critical care time: 32 minutes  Critical care time was exclusive of separately billable procedures and treating other patients.  Critical care was necessary to treat or prevent imminent or life-threatening deterioration.  Critical care was time spent personally by me on the following activities: development of treatment plan with patient and/or surrogate as well as nursing, discussions with consultants, evaluation of patient's response to treatment, examination of patient, obtaining history from patient or surrogate, ordering and performing treatments and interventions, ordering and review of laboratory studies, ordering and review of radiographic studies, pulse oximetry and re-evaluation of patient's condition.   Procedures   MEDICATIONS ORDERED IN ED: Medications  potassium chloride  10 mEq in 100 mL IVPB (10 mEq Intravenous New Bag/Given 05/16/24 0959)  midodrine  (PROAMATINE ) tablet 10 mg (10 mg Oral Given 05/16/24 0744)  sodium chloride  0.9 % bolus 500 mL (0 mLs Intravenous Stopped 05/16/24 1000)  fentaNYL  (SUBLIMAZE ) injection 50 mcg (50 mcg Intravenous Given 05/16/24 0742)  calcium  gluconate 1 g/ 50 mL sodium chloride  IVPB (0 mg Intravenous Stopped 05/16/24 0957)  magnesium  sulfate IVPB 2 g 50 mL (0 g Intravenous Stopped 05/16/24 0957)  midodrine  (PROAMATINE ) tablet 10 mg (10 mg Oral Given 05/16/24 1012)     IMPRESSION / MDM / ASSESSMENT AND PLAN / ED COURSE  I reviewed the triage vital signs and the nursing notes.  Differential diagnosis includes, but is not limited to, intracranial bleed, spine fracture, thoracoabdominal trauma, pneumonia, UTI, other infection, anemia  Patient's presentation is most consistent with acute presentation with potential threat to life or  bodily function.  78 year old female presenting to the emergency department for evaluation of generalized pain after a fall.  Overall fall sounds low mechanism, but patient was noted to be in A-fib with RVR with initial hypotension.  In the setting of this, will obtain CT head, C-spine, chest abdomen pelvis to further evaluate for posttraumatic and medical causes of patient's symptoms.  Does subjectively feel volume depleted, will trial small IV fluid bolus.  Did also miss her nighttime dose of midodrine , this was ordered.  1000 Labs demonstrated CBC with anemia, stable compared to prior from a few months ago.  CMP demonstrates multiple electrolyte derangements with potassium of 2.5, calcium  6.1, mag of 1.5.  Troponin slightly elevated at 41.  CT imaging fortunately without evidence of significant traumatic injury.  BP did improve after receiving her home midodrine  dose.  Patient does have intermittent A-fib with RVR here.  In setting of her electrolyte derangement, do think she is appropriate for admission.  Will reach out to hospitalist team.  Clinical Course as of 05/16/24 1025  Sat May 16, 2024  1021 Case reviewed with hospitalist team.  They will evaluate for anticipated admission. [NR]    Clinical Course User Index [NR] Dade Nilsa, MD     FINAL CLINICAL IMPRESSION(S) / ED DIAGNOSES   Final diagnoses:  Generalized weakness  Closed head injury, initial encounter  Hypokalemia  Hypocalcemia  Hypomagnesemia     Rx / DC Orders   ED Discharge Orders     None        Note:  This document was prepared using Dragon voice recognition software  and may include unintentional dictation errors.   Levander Slate, MD 05/16/24 1025

## 2024-05-16 NOTE — ED Notes (Signed)
 Pt transported via stretcher to assigned room, primary RN made aware

## 2024-05-16 NOTE — Progress Notes (Signed)
 Admitted with multiple wounds. Two Stage 2 to right buttocks. Top one measuring 1.3 X 1.5, and lower one measuring 2 X 1. BLE with redness and blisters. Dressing to buttocks and skin tears.

## 2024-05-16 NOTE — Plan of Care (Signed)
 Alert and oriented. Generalized pain in legs when turning. Has general knowledge of disease process and inability to care for herself at home. Resp even and unlabored. Chair bound. Turned and repositioned in bed. Appetite good with 100% of meals eaten. Stage 2 ulcers to buttocks and BLE's have redness, weeping, and blister. No signs of infection.  Problem: Education: Goal: Knowledge of General Education information will improve Description: Including pain rating scale, medication(s)/side effects and non-pharmacologic comfort measures Outcome: Progressing   Problem: Clinical Measurements: Goal: Will remain free from infection Outcome: Progressing Goal: Respiratory complications will improve Outcome: Progressing Goal: Cardiovascular complication will be avoided Outcome: Progressing   Problem: Activity: Goal: Risk for activity intolerance will decrease Outcome: Progressing   Problem: Nutrition: Goal: Adequate nutrition will be maintained Outcome: Progressing   Problem: Coping: Goal: Level of anxiety will decrease Outcome: Progressing   Problem: Safety: Goal: Ability to remain free from injury will improve Outcome: Progressing   Problem: Education: Goal: Knowledge of disease or condition will improve Outcome: Progressing Goal: Understanding of medication regimen will improve Outcome: Progressing

## 2024-05-16 NOTE — ED Notes (Signed)
 Called and talked with pharmacy, K, mag and calcium  and all compatible and can be y sited in.  Infusions up and infusing.

## 2024-05-16 NOTE — H&P (Signed)
 History and Physical    Connie Olsen:[id number] DOB: August 24, 1946 DOA: 05/16/2024  DOS: the patient was seen and examined on 05/16/2024  PCP: [facility]   Patient coming from: Home  I have personally briefly reviewed patient's old medical records in [facility]  Chief Complaint: Fall at home from a wheelchair  HPI: Connie Olsen is a pleasant 78 y.o. 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  Patient reports that she has been feeling weak for the last few days but which is not new to her.  She fell asleep in her wheelchair yesterday which she believes caused her to fall out of the wheelchair and hit her head.  She denies any loss of consciousness, not on anticoagulation for A-fib since she had a GI bleeding few months ago in May 2025.  Reports she has pain all over the body.  She also stated that she had completed dialysis yesterday, feels weak and dehydrated.  She stated that after dialysis she feels weak every time when she has dialysis.  She did not take any of her medications including midodrine  last night. She denies any fever, chills, nausea, vomiting, diarrhea, cough, shortness of breath or chest pain.  ED Course: Upon arrival to the ED, patient is found to be in atrial fibrillation with rapid ventricular response transiently.  It went back to normal.  Due to her history of fall at home with negative workup including but not limited to CT head CT neck and cervical spine and CT chest, she was still weak and debilitated.  Hospitalist service was consulted for evaluation for admission.  Review of Systems:  ROS  All other systems negative except as noted in the HPI.  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   Endometrial thickening on ultra sound 04/19/2015   History of ulcerative colitis    History of uterine fibroid    Osteoporosis     Past Surgical History:  Procedure Laterality Date   CHOLECYSTECTOMY     colonoscopy  05/08/2016   DIALYSIS/PERMA CATHETER REPAIR     LAPAROSCOPIC PARTIAL HEPATECTOMY     LOWER EXTREMITY ANGIOGRAPHY Right 04/23/2023   Procedure: Lower Extremity Angiography;  Surgeon: Jama Cordella MATSU, MD;  Location: [facility] INVASIVE CV LAB;  Service: Cardiovascular;  Laterality: Right;   MASTECTOMY PARTIAL / LUMPECTOMY     PORTA CATH INSERTION N/A 06/06/2021   Procedure: PORTA CATH INSERTION;  Surgeon: Jama Cordella MATSU, MD;  Location: [facility] INVASIVE CV LAB;  Service: Cardiovascular;  Laterality: N/A;   UPPER GI ENDOSCOPY  05/08/2016     reports that she has never smoked. She has never used smokeless tobacco. She reports that she does not drink alcohol and does not use drugs.  Allergies  Allergen Reactions   Aspirin  Other (See Comments)    Ulcerative colitis: UNABLE TO TOLERATE IF NOT ENTERIC COATED   Naltrexone Other (See Comments)   Librax  [Chlordiazepoxide-Clidinium] Anxiety    agitation   Povidone-Iodine  Itching and Rash    Only problem if it is topical, tolerates injected contrast   Tape Itching    Family History  Problem Relation Age of Onset   Breast cancer Mother  Prior to Admission medications   Medication Sig Start Date End Date Taking? Authorizing Provider  acetaminophen  (TYLENOL ) 650 MG CR tablet Take 650 mg by mouth every 8 (eight) hours as needed for pain. 04/09/24  Yes [provider]  B Complex-C-Folic Acid (DIALYVITE TABLET) TABS Take 1 tablet by mouth at bedtime. 07/14/14  Yes [provider]  buprenorphine  (BUTRANS ) 10 MCG/HR PTWK Place 1 patch onto the skin once a week.   Yes [provider]  cinacalcet  (SENSIPAR ) 90 MG tablet Take 90 mg by mouth See admin  instructions. Take 90 mg by mouth daily with largest meal of the day. 03/11/15  Yes [provider]  clopidogrel  (PLAVIX ) 75 MG tablet TAKE 1 TABLET BY MOUTH EVERY DAY 09/24/23  Yes Schnier, Cordella MATSU, MD  lidocaine  (LIDODERM ) 5 % Place 2 patches onto the skin daily. 10/02/23 05/06/25 Yes [provider]  lidocaine -prilocaine (EMLA) cream Apply 1 application topically daily as needed (prior to port use). 06/04/14  Yes [provider]  metoprolol  tartrate (LOPRESSOR ) 25 MG tablet Take 12.5 mg by mouth 2 (two) times daily as needed. 08/14/16  Yes [provider]  midodrine  (PROAMATINE ) 10 MG tablet Take 10 mg by mouth 3 (three) times daily. 08/10/15  Yes [provider]  naloxone (NARCAN) nasal spray 4 mg/0.1 mL Place into the nose. 02/13/23  Yes [provider]  pantoprazole  (PROTONIX ) 40 MG tablet Take 1 tablet (40 mg total) by mouth daily. 04/23/23 05/16/24 Yes Schnier, Cordella MATSU, MD  pregabalin  (LYRICA ) 25 MG capsule Take 25 mg by mouth 3 (three) times daily. 06/22/20  Yes [provider]  sodium chloride  (PF) 0.9 % SOLN 1 mL with cefTAZidime 1 mg/mL SOLN 1.25 mg, heparin  sodium (porcine) 1000 UNIT/ML SOLN 250 Units Inject into the vein. 01/11/23  Yes [provider]  vancomycin (VANCOCIN) 750 MG SOLR injection Vancomycin 750 mg IV with each dialysis session (every Monday, Wednesday, Friday) for skin and soft tissue infection left lower extremity.  MRSA positive culture.  Nephrology to dose and adjust per weekly vancomycin troughs and surveillance labs; CBC and BMP. 04/09/23  Yes [provider]  amoxicillin (AMOXIL) 500 MG capsule Take 500 mg by mouth 3 (three) times daily. Patient not taking: Reported on 05/16/2024 12/03/23   [provider]  aspirin  EC 81 MG tablet Take 81 mg by mouth at bedtime. Patient not taking: Reported on 05/16/2024    [provider]  diphenhydrAMINE  (BENADRYL  ALLERGY) 25 mg capsule Take by  mouth. Patient not taking: Reported on 05/16/2024 02/28/22   [provider]  ferric citrate  (AURYXIA) 1 GM 210 MG(Fe) tablet Take 420 mg by mouth 3 (three) times daily with meals. Patient not taking: Reported on 05/16/2024 09/05/18   [provider]  ibuprofen  (ADVIL ) 200 MG tablet Take 200-400 mg by mouth every 6 (six) hours as needed for moderate pain. Patient not taking: Reported on 05/16/2024 04/05/15   [provider]  Zinc Sulfate 220 (50 Zn) MG TABS Take by mouth. Patient not taking: Reported on 05/16/2024 02/13/23   [provider]    Physical Exam: Vitals:   05/16/24 0855 05/16/24 0900 05/16/24 0930 05/16/24 1005  BP: (!) 107/44 112/76 106/87 90/61  Pulse: 98 (!) 110 (!) 46 (!) 104  Resp: (!) 21 14 (!) 25 18  Temp: 98.8 F (37.1 C)     TempSrc: Oral     SpO2: 98% 98% 99% 98%  Weight:      Height:  Physical Exam   Constitutional: Alert, awake, calm, comfortable HEENT: Neck supple Respiratory: Clear to auscultation B/L, no wheezing, no rales.  Cardiovascular: Regular rate and rhythm, no murmurs / rubs / gallops. No extremity edema. 2+ pedal pulses. No carotid bruits.  Abdomen: Soft, no tenderness, Bowel sounds positive.  Musculoskeletal: no clubbing / cyanosis. Good ROM, no contractures. Normal muscle tone.  Skin: no rashes, lesions, ulcers. Neurologic: CN 2-12 grossly intact. Sensation intact, No focal deficit identified Psychiatric: Alert and oriented x 3. Normal mood.    Labs on Admission: I have personally reviewed following labs and imaging studies  CBC: Recent Labs  Lab 05/16/24 0748  WBC 10.5  NEUTROABS 8.7*  HGB 10.9*  HCT 35.1*  MCV 85.0  PLT 264   Basic Metabolic Panel: Recent Labs  Lab 05/16/24 0748  NA 144  K 2.5*  CL 102  CO2 22  GLUCOSE 101*  BUN 13  CREATININE 2.68*  CALCIUM  6.1*  MG 1.5*   GFR: Estimated Creatinine Clearance: 14.6 mL/min (A) (by C-G formula based on SCr of 2.68 mg/dL (H)). Liver  Function Tests: Recent Labs  Lab 05/16/24 0748  AST 33  ALT <5  ALKPHOS 196*  BILITOT 1.4*  PROT 6.5  ALBUMIN 3.0*   No results for input(s): LIPASE, AMYLASE in the last 168 hours. No results for input(s): AMMONIA in the last 168 hours. Coagulation Profile: No results for input(s): INR, PROTIME in the last 168 hours. Cardiac Enzymes: Recent Labs  Lab 05/16/24 0748 05/16/24 1016  TROPONINIHS 41* 43*   BNP (last 3 results) No results for input(s): BNP in the last 8760 hours. HbA1C: No results for input(s): HGBA1C in the last 72 hours. CBG: No results for input(s): GLUCAP in the last 168 hours. Lipid Profile: No results for input(s): CHOL, HDL, LDLCALC, TRIG, CHOLHDL, LDLDIRECT in the last 72 hours. Thyroid Function Tests: No results for input(s): TSH, T4TOTAL, FREET4, T3FREE, THYROIDAB in the last 72 hours. Anemia Panel: No results for input(s): VITAMINB12, FOLATE, FERRITIN, TIBC, IRON, RETICCTPCT in the last 72 hours. Urine analysis: No results found for: COLORURINE, APPEARANCEUR, LABSPEC, PHURINE, GLUCOSEU, HGBUR, BILIRUBINUR, KETONESUR, PROTEINUR, UROBILINOGEN, NITRITE, LEUKOCYTESUR  Radiological Exams on Admission: I have personally reviewed images CT Cervical Spine Wo Contrast Result Date: 05/16/2024 CLINICAL DATA:  78 year old female status post fall from wheelchair, hypotension, dialysis patient. Headache. EXAM: CT CERVICAL SPINE WITHOUT CONTRAST TECHNIQUE: Multidetector CT imaging of the cervical spine was performed without intravenous contrast. Multiplanar CT image reconstructions were also generated. RADIATION DOSE REDUCTION: This exam was performed according to the departmental dose-optimization program which includes automated exposure control, adjustment of the mA and/or kV according to patient size and/or use of iterative reconstruction technique. COMPARISON:  Head CT today.  Cervical spine  radiographs 06/16/2019. FINDINGS: Alignment: Straightening of cervical lordosis. Cervicothoracic junction alignment is within normal limits. Mild degenerative anterolisthesis of C4 on C5, C3 on C4. See additional details of those levels below. Maintained posterior element alignment. 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 Visualized skull base is intact. No atlanto-occipital dissociation. C1 and C2 are degenerated anteriorly, but appear intact and aligned. No acute osseous abnormality identified. Soft tissues and spinal canal: No prevertebral fluid or swelling. No visible canal hematoma. Advanced calcified atherosclerosis. Bilateral thoracic inlet vascular catheters. Coarsely calcified left thyroid nodule (no follow-up imaging recommended). Otherwise negative visible noncontrast neck soft tissues. Disc levels: Degenerative ankylosis of C3-C4 and C4-C5 facets. Degenerative Interbody ankylosis at C4-C5, C5-C6, and likely also C6-C7. Chronic disc, endplate, and  facet degeneration at those levels. Disc and posterior element degeneration also at the unfused C2-C3 level. Mild spinal stenosis suspected there. Upper chest: Chest CT today reported separately. IMPRESSION: 1. No acute traumatic injury identified in the cervical spine. 2. 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 Mild multifactorial spinal stenosis suspected at C2-C3. Electronically Signed   By: VEAR Hurst M.D.   On: 05/16/2024 09:50   CT CHEST ABDOMEN PELVIS WO CONTRAST Result Date: 05/16/2024 CLINICAL DATA:  78 year old female status post fall from wheelchair, hypotension, dialysis patient. Headache. EXAM: CT CHEST, ABDOMEN AND PELVIS WITHOUT CONTRAST TECHNIQUE: Multidetector CT imaging of the chest, abdomen and pelvis was performed following the standard protocol without IV contrast. RADIATION DOSE REDUCTION: This exam was performed according to the departmental  dose-optimization program which includes automated exposure control, adjustment of the mA and/or kV according to patient size and/or use of iterative reconstruction technique. COMPARISON:  Noncontrast CT Abdomen and Pelvis 06/15/2022. FINDINGS: CT CHEST FINDINGS Cardiovascular: Left chest dual lumen dialysis type catheter. Right chest Port-A-Cath. Calcified aortic atherosclerosis. Calcified coronary artery atherosclerosis and/or stents. Mild cardiomegaly, mostly biatrial enlargement. No significant pericardial effusion; there is likely chronic pericardial fluid in a sub carina recess, partially visible in 2023 also. Vascular patency is not evaluated in the absence of IV contrast. Mediastinum/Nodes: No evidence of mediastinal hematoma, mass, lymphadenopathy on this noncontrast exam. Lungs/Pleura: Major airways are patent. Increased elevation of the right hemidiaphragm and confluent right lower lobe compressive atelectasis with mild air bronchograms since 2023. No superimposed pleural effusion. No pneumothorax. No pulmonary edema. Calcified subpleural left upper lobe granuloma on series 4, image 27 (no follow-up imaging recommended). Subcentimeter left lower lobe subpleural nodule also stable, series 4, image 21 previously (no follow-up imaging recommended). Scattered subpleural lung scarring otherwise. Musculoskeletal: Chronic severe degeneration at the shoulders, greater on the left. 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 Hyperostosis in the thoracic spine with multilevel interbody ankylosis. Maintained thoracic vertebral height. Chronic and healing bilateral rib fractures, including left lateral ribs 5 through 7, posterior ribs 10 and 11 bilaterally. No acute rib fracture is identified. CT ABDOMEN PELVIS FINDINGS Hepatobiliary: Stable and negative noncontrast liver. Diminutive or absent gallbladder. Pancreas: Partially atrophied. Spleen: Negative. Adrenals/Urinary Tract: Chronic native renal atrophy. Exophytic  simple fluid density right upper pole cyst (no follow-up imaging recommended). Negative adrenal glands. Decompressed bladder. Stomach/Bowel: Nondilated large and small bowel loops. Descending and sigmoid colon diverticulosis. Less pronounced transverse colon diverticulosis. Redundant hepatic flexure. Small chronic fat containing ventral abdominal hernia is stable since 2023. Small volume retained fluid in the stomach and duodenum. No pneumoperitoneum, free fluid, or mesenteric inflammation identified. Vascular/Lymphatic: Severe Aortoiliac calcified atherosclerosis. Normal caliber abdominal aorta. Vascular patency is not evaluated in the absence of IV contrast. No lymphadenopathy identified. Reproductive: Small calcified uterine fibroids. Otherwise negative noncontrast appearance. Other: No pelvis free fluid. Musculoskeletal: Renal osteodystrophy. Moderate chronic levoconvex lumbar scoliosis. Stable vertebral height since 2023. Chronic but increased bulky subchondral cysts at the right hip joint. No acute fracture or dislocation identified. IMPRESSION: 1. No acute traumatic injury identified in the noncontrast chest, abdomen, or pelvis. 2. Chronic and healing bilateral rib fractures. Underlying renal osteodystrophy. 3. Increased right hemidiaphragm elevation and right lower lobe atelectasis since 2023. 4. Advanced calcified atherosclerosis. Aortic Atherosclerosis (ICD10-I70.0). Electronically Signed   By: VEAR Hurst M.D.   On: 05/16/2024 08:51   CT Head Wo Contrast Result Date: 05/16/2024 CLINICAL DATA:  78 year old female status post fall from wheelchair, hypotension, dialysis patient. Headache.  EXAM: CT HEAD WITHOUT CONTRAST TECHNIQUE: Contiguous axial images were obtained from the base of the skull through the vertex without intravenous contrast. RADIATION DOSE REDUCTION: This exam was performed according to the departmental dose-optimization program which includes automated exposure control, adjustment of the mA  and/or kV according to patient size and/or use of iterative reconstruction technique. COMPARISON:  Head CT 06/15/2022. FINDINGS: Brain: Chronic encephalomalacia lateral left parietooccipital lobe junction, stable since 2023. Stable cerebral volume. No midline shift, ventriculomegaly, mass effect, evidence of mass lesion, intracranial hemorrhage or evidence of cortically based acute infarction. Maintained gray-white differentiation elsewhere. Vascular: Advanced Calcified atherosclerosis at the skull base. No suspicious intracranial vascular hyperdensity. Skull: Stable, intact. Sinuses/Orbits: Visualized paranasal sinuses and mastoids are stable and well aerated. Other: Calcified scalp vessel atherosclerosis. No acute orbit or scalp soft tissue injury identified. Postoperative changes to the right globe since 2023. IMPRESSION: 1. No acute intracranial abnormality or acute traumatic injury identified. 2. 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 3. Advanced calcified atherosclerosis. Electronically Signed   By: VEAR Hurst M.D.   On: 05/16/2024 08:40    EKG: My personal interpretation of EKG shows: Atrial fibrillation    Assessment/Plan Principal Problem:   Fall at home, initial encounter Active Problems:   A-fib (HCC)   End stage renal disease (HCC)   Port-A-Cath in place   Hyperlipidemia   Hyperphosphatemia    Assessment and Plan:  This is a 78 year old female who has multiple medical problems including but not limited to ESRD on hemodialysis, atrial fibrillation not on anticoagulation, type 2 diabetes, history of GI bleeding who came into ED complaining of fall from wheelchair at home last night.  1.  Fall, likely mechanical, history of A-fib, generally weak after dialysis - She will be placed in observation. - Her CT scan of the brain, neck and chest and abdomen were negative for acute pathology - She appears to be slightly dehydrated after dialysis. -  Will continue to monitor her blood pressure - She will be monitored in telemetry - She will be given physical and Occupational Therapy evaluation  2.  A-fib with transient RVR - She is at baseline - Will continue home medications metoprolol  and Plavix   - Continue to monitor her heart rate in telemetry - She is not on anticoagulation after GI bleed in May  3.  ESRD on hemodialysis Monday Wednesday and Friday - She does not have urgent need for dialysis - Will get nephrology involved if she stays until Monday.  4.  Hypokalemia/hypomagnesemia - Replaced in ED - Will check potassium and magnesium  in the morning  5.  HTN/HLD - Resume home medications  6.  Chronic pain syndrome - Patient takes buprenorphine  at home which will be continued  7.  GERD - Continue Protonix   8.  Chronic hypotension - Resume her home midodrine  10 mg 3 times daily - Continue to monitor blood pressure    DVT prophylaxis: SQ Heparin  Code Status: DNR/DNI(Do NOT Intubate) Family Communication: None  Disposition Plan: Home vs Rehab  Consults called: PT  Admission status: Observation, Telemetry bed   Nena Rebel, MD [facility] 05/16/2024, 11:22 AM

## 2024-05-16 NOTE — ED Notes (Signed)
 Pt in bed, pt has skin tears on L hand and R wrist, pt awake and oriented, pt states that she has no new pain in her person, does report a headache, states that she fell asleep in her wheel chair and fell out.  Pt placed on 2L O2 secondary to low O2 sat.

## 2024-05-16 NOTE — ED Notes (Signed)
 Fall risk bundle is currently in place.

## 2024-05-16 NOTE — Evaluation (Signed)
 Occupational Therapy Evaluation Patient Details Name: Connie Olsen MRN: [id number] DOB: 12-10-1945 Today's Date: 05/16/2024   History of Present Illness   Connie Olsen is a pleasant 78 y.o. 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 Patient reports that she has been feeling weak for the last few days. She fell asleep in her wheelchair yesterday which she believes caused her to fall out of the wheelchair and hit her head. She denies any loss of consciousness, not on anticoagulation for A-fib since she had a GI bleeding in May 2025.  Reports she has pain all over the body. She denies any fever, chills, nausea, vomiting, diarrhea, cough, shortness of breath or chest pain.     Clinical Impressions Ms. Labrum presents to [facility] ED following a fall from her wheelchair during the night. She reports that she has recently begun sleeping in her wheelchair because she is unable to [facility] transfer to/from [facility]. She states that she has a personal care attendant 3-4 hrs every AM, helping her get ready for HD; then has a PCA 3-4 hours in the afternoon/early evening. She states that she requires assistance from these attendants for bed mobility, transfers, grooming, dressing, toileting, bathing, and all IADLs. When she is home alone -- as she is every night -- she is unable to eat, transfer, or get herself to/from toilet. During today's evaluation, pt is moaning in bed, endorses 8/10 pain all over, and requires Max A for rolling in bed. She is unable/unwilling to attempt EOB sitting, citing pain. Per RN, pt has several stage 2 wounds on her buttocks/sacrum. Recommend ongoing OT during hospitalization, with DC to facility where she can obtain daily rehab services, <3 hrs/day. Discussed with pt that her current living situation may well be unsafe for her --  that, even with daily attendants, she does not have the support she needs at home to live alone. Pt is in agreement and appears open to considering alternatives if needed.     If plan is discharge home, recommend the following:   A lot of help with walking and/or transfers;A lot of help with bathing/dressing/bathroom;Assistance with feeding;Assist for transportation;Help with stairs or ramp for entrance;Assistance with cooking/housework     Functional Status Assessment   Patient has had a recent decline in their functional status and demonstrates the ability to make significant improvements in function in a reasonable and predictable amount of time.     Equipment Recommendations   None recommended by OT     Recommendations for Other Services         Precautions/Restrictions   Precautions Precautions: Fall Restrictions Weight Bearing Restrictions Per Provider Order: No     Mobility Bed Mobility Overal bed mobility: Needs Assistance Bed Mobility: Rolling Rolling: Max assist         General bed mobility comments: Max A for rolling L and R in bed. Unable to perform supine<>sit, 2/2 pain    Transfers                   General transfer comment: Unable      Balance Overall balance assessment: Needs assistance   Sitting balance-Leahy Scale: Poor       Standing balance-Leahy Scale: Zero  ADL either performed or assessed with clinical judgement   ADL Overall ADL's : Needs assistance/impaired Eating/Feeding: Moderate assistance   Grooming: Maximal assistance Grooming Details (indicate cue type and reason): Pt report she requires assistance for brushing hair and teeth Upper Body Bathing: Moderate assistance   Lower Body Bathing: Maximal assistance   Upper Body Dressing : Moderate assistance   Lower Body Dressing: Maximal assistance Lower Body Dressing Details (indicate cue type and reason): States that she  wears PJ bottoms at home, and that PCA assists with donning/doffing Toilet Transfer: Maximal assistance           Functional mobility during ADLs: Maximal assistance General ADL Comments: anticipate Mod-Max A for all ADLs     Vision         Perception         Praxis         Pertinent Vitals/Pain Pain Assessment Pain Assessment: 0-10 Pain Score: 8  Pain Location: all over Pain Descriptors / Indicators: Grimacing, Aching, Moaning Pain Intervention(s): Limited activity within patient's tolerance, Repositioned, Monitored during session     Extremity/Trunk Assessment Upper Extremity Assessment Upper Extremity Assessment: Generalized weakness   Lower Extremity Assessment Lower Extremity Assessment: Generalized weakness       Communication Communication Communication: No apparent difficulties   Cognition Arousal: Alert Behavior During Therapy: WFL for tasks assessed/performed Cognition: No apparent impairments                               Following commands: Intact       Cueing  General Comments      multiple cuts, bruises,and scabs along b/l UE; sores and discoloration lower b/l LE   Exercises Other Exercises Other Exercises: Educ re: PoC, DC recs   Shoulder Instructions      Home Living Family/patient expects to be discharged to:: Private residence Living Arrangements: Alone Available Help at Discharge: Personal care attendant Type of Home: House Home Access: Ramped entrance     Home Layout: One level               Home Equipment: Agricultural consultant (2 wheels);Wheelchair - manual;Hospital bed          Prior Functioning/Environment Prior Level of Function : Needs assist             Mobility Comments: Had been using RW for mobility; several months ago began using WC almost exclusively ADLs Comments: Has home care attendant 6-8 hrs/day. Requires assistance for all B/IADL.    OT Problem List: Decreased  strength;Decreased range of motion;Decreased activity tolerance;Impaired balance (sitting and/or standing);Pain;Impaired UE functional use   OT Treatment/Interventions: Self-care/ADL training;Therapeutic exercise;Patient/family education;Balance training;Energy conservation;Therapeutic activities;DME and/or AE instruction      OT Goals(Current goals can be found in the care plan section)   Acute Rehab OT Goals Patient Stated Goal: to go back to rehab OT Goal Formulation: With patient Time For Goal Achievement: 05/30/24 Potential to Achieve Goals: Fair ADL Goals Pt Will Perform Grooming: with contact guard assist;sitting;with set-up Pt Will Perform Upper Body Dressing: with min assist;sitting Pt Will Transfer to Toilet: stand pivot transfer;ambulating;with mod assist (using LRAD)   OT Frequency:  Min 2X/week    Co-evaluation              AM-PAC OT 6 Clicks Daily Activity     Outcome Measure Help from another person eating meals?: A Little Help from another person taking care of  personal grooming?: A Lot Help from another person toileting, which includes using toliet, bedpan, or urinal?: A Lot Help from another person bathing (including washing, rinsing, drying)?: A Lot Help from another person to put on and taking off regular upper body clothing?: A Lot Help from another person to put on and taking off regular lower body clothing?: A Lot 6 Click Score: 13   End of Session    Activity Tolerance: Patient limited by pain Patient left: in bed;with call bell/phone within reach;with nursing/sitter in room  OT Visit Diagnosis: Unsteadiness on feet (R26.81);Other abnormalities of gait and mobility (R26.89);History of falling (Z91.81);Repeated falls (R29.6);Muscle weakness (generalized) (M62.81);Pain                Time: 1525-1540 OT Time Calculation (min): 15 min Charges:  OT General Charges $OT Visit: 1 Visit OT Evaluation $OT Eval Moderate Complexity: 1 Mod Suzen Hock, PhD, MS, OTR/L 05/16/24, 4:11 PM

## 2024-05-16 NOTE — ED Notes (Signed)
Refusing labs in triage

## 2024-05-16 NOTE — ED Notes (Signed)
 Date and time results received: 05/16/24 850 (use smartphrase .now to insert current time)  Test: K and Cal Critical Value: 2.5, 6.1  Name of Provider Notified: Ray

## 2024-05-17 DIAGNOSIS — E1151 Type 2 diabetes mellitus with diabetic peripheral angiopathy without gangrene: Secondary | ICD-10-CM | POA: Diagnosis present

## 2024-05-17 DIAGNOSIS — E1122 Type 2 diabetes mellitus with diabetic chronic kidney disease: Secondary | ICD-10-CM | POA: Diagnosis present

## 2024-05-17 DIAGNOSIS — I120 Hypertensive chronic kidney disease with stage 5 chronic kidney disease or end stage renal disease: Secondary | ICD-10-CM | POA: Diagnosis present

## 2024-05-17 DIAGNOSIS — N2581 Secondary hyperparathyroidism of renal origin: Secondary | ICD-10-CM | POA: Diagnosis present

## 2024-05-17 DIAGNOSIS — I9589 Other hypotension: Secondary | ICD-10-CM | POA: Diagnosis present

## 2024-05-17 DIAGNOSIS — W19XXXA Unspecified fall, initial encounter: Secondary | ICD-10-CM

## 2024-05-17 DIAGNOSIS — E876 Hypokalemia: Secondary | ICD-10-CM

## 2024-05-17 DIAGNOSIS — L97929 Non-pressure chronic ulcer of unspecified part of left lower leg with unspecified severity: Secondary | ICD-10-CM | POA: Diagnosis present

## 2024-05-17 DIAGNOSIS — D631 Anemia in chronic kidney disease: Secondary | ICD-10-CM | POA: Diagnosis present

## 2024-05-17 DIAGNOSIS — Y92009 Unspecified place in unspecified non-institutional (private) residence as the place of occurrence of the external cause: Secondary | ICD-10-CM | POA: Diagnosis not present

## 2024-05-17 DIAGNOSIS — Z992 Dependence on renal dialysis: Secondary | ICD-10-CM | POA: Diagnosis not present

## 2024-05-17 DIAGNOSIS — R5381 Other malaise: Secondary | ICD-10-CM | POA: Diagnosis present

## 2024-05-17 DIAGNOSIS — Z66 Do not resuscitate: Secondary | ICD-10-CM | POA: Diagnosis present

## 2024-05-17 DIAGNOSIS — K219 Gastro-esophageal reflux disease without esophagitis: Secondary | ICD-10-CM | POA: Diagnosis present

## 2024-05-17 DIAGNOSIS — W050XXA Fall from non-moving wheelchair, initial encounter: Secondary | ICD-10-CM | POA: Diagnosis present

## 2024-05-17 DIAGNOSIS — Z8719 Personal history of other diseases of the digestive system: Secondary | ICD-10-CM

## 2024-05-17 DIAGNOSIS — L97919 Non-pressure chronic ulcer of unspecified part of right lower leg with unspecified severity: Secondary | ICD-10-CM | POA: Diagnosis present

## 2024-05-17 DIAGNOSIS — N186 End stage renal disease: Secondary | ICD-10-CM | POA: Diagnosis present

## 2024-05-17 DIAGNOSIS — G894 Chronic pain syndrome: Secondary | ICD-10-CM | POA: Diagnosis present

## 2024-05-17 DIAGNOSIS — I482 Chronic atrial fibrillation, unspecified: Secondary | ICD-10-CM | POA: Diagnosis present

## 2024-05-17 DIAGNOSIS — E860 Dehydration: Secondary | ICD-10-CM | POA: Diagnosis present

## 2024-05-17 DIAGNOSIS — E785 Hyperlipidemia, unspecified: Secondary | ICD-10-CM | POA: Diagnosis present

## 2024-05-17 DIAGNOSIS — L89152 Pressure ulcer of sacral region, stage 2: Secondary | ICD-10-CM | POA: Diagnosis present

## 2024-05-17 DIAGNOSIS — Z79899 Other long term (current) drug therapy: Secondary | ICD-10-CM | POA: Diagnosis not present

## 2024-05-17 DIAGNOSIS — Z7902 Long term (current) use of antithrombotics/antiplatelets: Secondary | ICD-10-CM | POA: Diagnosis not present

## 2024-05-17 LAB — HEPATITIS B SURFACE ANTIGEN: Hepatitis B Surface Ag: NONREACTIVE

## 2024-05-17 LAB — CBC
HCT: 32.3 % — ABNORMAL LOW (ref 36.0–46.0)
Hemoglobin: 10.4 g/dL — ABNORMAL LOW (ref 12.0–15.0)
MCH: 27.1 pg (ref 26.0–34.0)
MCHC: 32.2 g/dL (ref 30.0–36.0)
MCV: 84.1 fL (ref 80.0–100.0)
Platelets: 230 K/uL (ref 150–400)
RBC: 3.84 MIL/uL — ABNORMAL LOW (ref 3.87–5.11)
RDW: 21.9 % — ABNORMAL HIGH (ref 11.5–15.5)
WBC: 8.6 K/uL (ref 4.0–10.5)
nRBC: 0 % (ref 0.0–0.2)

## 2024-05-17 LAB — PROTIME-INR
INR: 1.2 (ref 0.8–1.2)
Prothrombin Time: 15.7 s — ABNORMAL HIGH (ref 11.4–15.2)

## 2024-05-17 LAB — MAGNESIUM: Magnesium: 2.1 mg/dL (ref 1.7–2.4)

## 2024-05-17 MED ORDER — PREGABALIN 25 MG PO CAPS
25.0000 mg | ORAL_CAPSULE | Freq: Three times a day (TID) | ORAL | Status: DC
  Administered 2024-05-17 – 2024-05-21 (×11): 25 mg via ORAL
  Filled 2024-05-17 (×12): qty 1

## 2024-05-17 MED ORDER — POTASSIUM CHLORIDE 10 MEQ/100ML IV SOLN
10.0000 meq | INTRAVENOUS | Status: AC
  Administered 2024-05-17 (×5): 10 meq via INTRAVENOUS
  Filled 2024-05-17 (×5): qty 100

## 2024-05-17 MED ORDER — ACETAMINOPHEN 650 MG RE SUPP: 650.0000 mg | Freq: Four times a day (QID) | RECTAL | Status: DC | PRN

## 2024-05-17 MED ORDER — POTASSIUM CHLORIDE CRYS ER 20 MEQ PO TBCR
40.0000 meq | EXTENDED_RELEASE_TABLET | Freq: Once | ORAL | Status: DC
  Administered 2024-05-17: 40 meq via ORAL
  Filled 2024-05-17: qty 2

## 2024-05-17 MED ORDER — ACETAMINOPHEN 325 MG PO TABS
650.0000 mg | ORAL_TABLET | Freq: Four times a day (QID) | ORAL | Status: DC | PRN
  Administered 2024-05-19: 650 mg via ORAL
  Filled 2024-05-17: qty 2

## 2024-05-17 MED ORDER — CALCIUM GLUCONATE-NACL 2-0.675 GM/100ML-% IV SOLN
2.0000 g | Freq: Once | INTRAVENOUS | Status: AC
  Administered 2024-05-17: 2000 mg via INTRAVENOUS
  Filled 2024-05-17: qty 100

## 2024-05-17 NOTE — Progress Notes (Signed)
 BLE wrapped in kerlix, tolerated well

## 2024-05-17 NOTE — Evaluation (Signed)
 Physical Therapy Evaluation Patient Details Name: Connie Olsen MRN: [id number] DOB: Aug 17, 1946 Today's Date: 05/17/2024  History of Present Illness  presented to ER secondary to fall from [facility] (when asleep); admitted for management of generalized debility, Afib with RVR.  Clinical Impression  Patient resting in bed upon arrival to room; easily awakens to voice, light tough.  Generally aggravated by therapist waking her, but somewhat redirectable with encouragement throughout session.  Oriented to self, location and situation; follows simple commands within tasks she's willing to complete.    Endorses pain all over, rated 6/10; meds requested and administered per RN during session. Patient globally weak and deconditioned throughout all extremities, but no acute focal weakness appreciated.  Currently requiring active assist from therapist for movement throughout available, tolerable ROM.  Palpable crepitus R knee with movement during session. Refuses all attempts at rolling, repositioning, transitioning to edge of bed/OOB despite encouragement from therapist.  Cites pain and inability to tolerate/participate with mobility efforts.  Reinforced role of mobility and progress/participation with therapy as part of recovery process.  Patient voiced understanding, but continued to decline. Would benefit from skilled PT to address above deficits and promote optimal return to PLOF, pending ability/willingness to actively participate and progress with skilled interventions; recommend post-acute PT follow up as indicated by interdisciplinary care team.     If patient unable/unwilling to consistently participate OR if patient unable to manage care needs in home environment, may benefit from transition to LTC to better manage care needs.       If plan is discharge home, recommend the following: A lot of help with walking and/or transfers;A lot of help with bathing/dressing/bathroom   Can travel by private  vehicle        Equipment Recommendations    Recommendations for Other Services       Functional Status Assessment Patient has had a recent decline in their functional status and/or demonstrates limited ability to make significant improvements in function in a reasonable and predictable amount of time     Precautions / Restrictions Precautions Precautions: Fall Restrictions Weight Bearing Restrictions Per Provider Order: No      Mobility  Bed Mobility Overal bed mobility: Needs Assistance Bed Mobility: Rolling Rolling: Total assist         General bed mobility comments: max/total assist for any/all repositioning efforts    Transfers                   General transfer comment: refuses all attempts at repositioning, mobility    Ambulation/Gait               General Gait Details: non-ambulatory at baseline  Stairs            Wheelchair Mobility     Tilt Bed    Modified Rankin (Stroke Patients Only)       Balance                                             Pertinent Vitals/Pain Pain Assessment Pain Assessment: 0-10 Pain Score: 6  Pain Location: all over Pain Descriptors / Indicators: Grimacing, Aching, Moaning Pain Intervention(s): Limited activity within patient's tolerance, Monitored during session, Repositioned, Patient requesting pain meds-RN notified    Home Living Family/patient expects to be discharged to:: Private residence Living Arrangements: Alone Available Help at Discharge: Personal care attendant Type of Home:  House Home Access: Ramped entrance       Home Layout: One level Home Equipment: Agricultural consultant (2 wheels);Wheelchair - manual;Hospital bed      Prior Function Prior Level of Function : Needs assist             Mobility Comments: Had been using RW for mobility until this past spring.  Since Spring, has been using manual WC as primary mobility, requiring +1 assist for all transfers  in/out of WC (SPT with RW) ADLs Comments: Has home care attendant 6-8 hrs/day. Requires assistance for all B/IADL.     Extremity/Trunk Assessment   Upper Extremity Assessment Upper Extremity Assessment: Generalized weakness (grossly at least 3- to 4-/5 throughout; chronic ROM deficits L > R shoulder.)    Lower Extremity Assessment Lower Extremity Assessment: Generalized weakness (grossly at least 3-/5 throughout.  Bilat LEs lacking full TKE (R > L) with mild varus deformity throughout.  Palpable crepitus with ROM to R knee.)    Cervical / Trunk Assessment Cervical / Trunk Assessment: Kyphotic (prefers resting in position of R lateral trunk flexion)  Communication   Communication Communication: No apparent difficulties    Cognition Arousal: Alert Behavior During Therapy: Agitated   PT - Cognitive impairments: No apparent impairments                       PT - Cognition Comments: Generally irritated by therapist presence and attempts to introduce mobility Following commands: Intact       Cueing       General Comments      Exercises     Assessment/Plan    PT Assessment Patient needs continued PT services  PT Problem List Decreased strength;Decreased range of motion;Decreased activity tolerance;Decreased balance;Decreased mobility;Pain;Decreased knowledge of precautions;Decreased safety awareness;Decreased knowledge of use of DME       PT Treatment Interventions DME instruction;Functional mobility training;Therapeutic activities;Therapeutic exercise;Balance training;Patient/family education    PT Goals (Current goals can be found in the Care Plan section)  Acute Rehab PT Goals Patient Stated Goal: I just want to finish my nap PT Goal Formulation: With patient Time For Goal Achievement: 05/31/24 Potential to Achieve Goals: Fair    Frequency Min 1X/week     Co-evaluation               AM-PAC PT 6 Clicks Mobility  Outcome Measure Help needed  turning from your back to your side while in a flat bed without using bedrails?: Total Help needed moving from lying on your back to sitting on the side of a flat bed without using bedrails?: Total Help needed moving to and from a bed to a chair (including a wheelchair)?: Total Help needed standing up from a chair using your arms (e.g., wheelchair or bedside chair)?: Total Help needed to walk in hospital room?: Total Help needed climbing 3-5 steps with a railing? : Total 6 Click Score: 6    End of Session   Activity Tolerance: Patient limited by pain (generally disinterested in participation) Patient left: in bed;with call bell/phone within reach;with bed alarm set Nurse Communication: Mobility status;Patient requests pain meds PT Visit Diagnosis: Muscle weakness (generalized) (M62.81);Pain    Time: 8481-8464 PT Time Calculation (min) (ACUTE ONLY): 17 min   Charges:   PT Evaluation $PT Eval Moderate Complexity: 1 Mod   PT General Charges $$ ACUTE PT VISIT: 1 Visit        Xzandria Clevinger H. Delores, PT, DPT, NCS 05/17/24, 3:50 PM [phone]

## 2024-05-17 NOTE — Progress Notes (Signed)
 PHARMACY CONSULT NOTE - FOLLOW UP  Pharmacy Consult for Electrolyte Monitoring and Replacement   Recent Labs: Potassium (mmol/L)  Date Value  05/17/2024 2.6 (LL)   Magnesium  (mg/dL)  Date Value  90/86/7974 1.5 (L)   Calcium  (mg/dL)  Date Value  90/85/7974 5.8 (LL)   Albumin (g/dL)  Date Value  90/85/7974 2.8 (L)   Sodium (mmol/L)  Date Value  05/17/2024 143     Assessment: 9/14 @ 0535:   K = 2.6                          Ca = 5.8,  Alb = 2.8,  Corrected Ca = 6.8  Goal of Therapy:  Electrolytes WNL   Plan:  Will order KCl 10 mEq IV X 5                    Calcium  gluconate 2 gm IVPB X 1                - recheck electrolytes @ 1400   Rhiannon Sassaman D ,PharmD Clinical Pharmacist 05/17/2024 6:39 AM

## 2024-05-17 NOTE — Plan of Care (Signed)

## 2024-05-17 NOTE — Progress Notes (Signed)
 PROGRESS NOTE    Connie Olsen  FMW:[id number] DOB: 1946/03/10 DOA: 05/16/2024 PCP: [facility]     Brief Narrative:   From admission h and p Connie Olsen is a pleasant 78 y.o. 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  Patient reports that she has been feeling weak for the last few days but which is not new to her.  She fell asleep in her wheelchair yesterday which she believes caused her to fall out of the wheelchair and hit her head.  She denies any loss of consciousness, not on anticoagulation for A-fib since she had a GI bleeding few months ago in May 2025.  Reports she has pain all over the body.  She also stated that she had completed dialysis yesterday, feels weak and dehydrated.  She stated that after dialysis she feels weak every time when she has dialysis.  She did not take any of her medications including midodrine  last night. She denies any fever, chills, nausea, vomiting, diarrhea, cough, shortness of breath or chest pain.   Assessment & Plan:   Principal Problem:   Fall at home, initial encounter Active Problems:   A-fib (HCC)   End stage renal disease (HCC)   Cancer of gallbladder (HCC)   Port-A-Cath in place   Chronic hypotension   Type 2 diabetes mellitus with other diabetic kidney complication (HCC)   Hyperlipidemia   Hyperphosphatemia   Other chronic pain   Ductal carcinoma in situ (DCIS) of left breast   PAD (peripheral artery disease) (HCC)   History of GI bleed  # Fall at home Hit head, some bruising also on hands, denies other injury. Ct head/neck negative - monitor  # Debility Recent extended rehab stay, now wheelchair-bound, PT advises snf. Patient desires this, willing to pay out-of-pocket - TOC consulted for SNF  # Hypokalemia # Hypomagnesemia With chronic loose  stools from IBD, last bm yesterday - replete, with caution  # ESRD  On mwf hemodialysis, appears stable - nephrology consulted for maintenance hemodialysis  # A-rib Rate controlled. Off anticoagulant since GI bleed earlier this year - home metop  # Chronic hypotension - home midodrine   # History gallbladder cancer, DCIS Both treated, now surveilled  # T2DM  # Hypocalcemia - per nephrology  # Chronic pain - home pregabalin , buprenorphine  patch  # PAD - home plavix   # Lower extremity ulcers Chronic - local wound care   DVT prophylaxis: heparin  Code Status: dnr/dni Family Communication: son daniel updated telephonically 9/14  Level of care: Telemetry Medical Status is: Observation    Consultants:  nephrology  Procedures: Hemodialysis pending  Antimicrobials:  none    Subjective: Reports mild head pain after fall, some general body aches nothing specific  Objective: Vitals:   05/16/24 1738 05/16/24 1931 05/17/24 0002 05/17/24 0421  BP: (!) 97/55 (!) 93/45 (!) 99/50 112/65  Pulse: 64 (!) 54 80 81  Resp: 18 16 16 16   Temp: 98 F (36.7 C) 97.6 F (36.4 C)  98.1 F (36.7 C)  TempSrc: Oral Oral  Oral  SpO2: 99% 94% 96%   Weight:      Height:        Intake/Output Summary (Last 24 hours) at 05/17/2024 1055 Last data filed at 05/16/2024 1523 Gross per 24 hour  Intake 100 ml  Output 0 ml  Net  100 ml   Filed Weights   05/16/24 0050 05/16/24 1254  Weight: 63.5 kg 63.5 kg    Examination:  General exam: Appears calm and comfortable, chronically ill appearing Respiratory system: Clear to auscultation. Respiratory effort normal. Cardiovascular system: S1 & S2 heard, RRR.   Gastrointestinal system: Abdomen is nondistended, soft and nontender. N  Central nervous system: Alert and oriented. No focal neurological deficits. Extremities: no edema Skin: ulcerations b/l shins, bruising on hands Psychiatry: Judgement and insight appear normal. Mood &  affect appropriate.     Data Reviewed: I have personally reviewed following labs and imaging studies  CBC: Recent Labs  Lab 05/16/24 0748 05/17/24 0535  WBC 10.5 8.6  NEUTROABS 8.7*  --   HGB 10.9* 10.4*  HCT 35.1* 32.3*  MCV 85.0 84.1  PLT 264 230   Basic Metabolic Panel: Recent Labs  Lab 05/16/24 0748 05/17/24 0535  NA 144 143  K 2.5* 2.6*  CL 102 104  CO2 22 23  GLUCOSE 101* 99  BUN 13 22  CREATININE 2.68* 4.00*  CALCIUM  6.1* 5.8*  MG 1.5*  --    GFR: Estimated Creatinine Clearance: 9.7 mL/min (A) (by C-G formula based on SCr of 4 mg/dL (H)). Liver Function Tests: Recent Labs  Lab 05/16/24 0748 05/17/24 0535  AST 33 30  ALT <5 <5  ALKPHOS 196* 210*  BILITOT 1.4* 1.3*  PROT 6.5 6.0*  ALBUMIN 3.0* 2.8*   No results for input(s): LIPASE, AMYLASE in the last 168 hours. No results for input(s): AMMONIA in the last 168 hours. Coagulation Profile: Recent Labs  Lab 05/17/24 0535  INR 1.2   Cardiac Enzymes: No results for input(s): CKTOTAL, CKMB, CKMBINDEX, TROPONINI in the last 168 hours. BNP (last 3 results) No results for input(s): PROBNP in the last 8760 hours. HbA1C: No results for input(s): HGBA1C in the last 72 hours. CBG: No results for input(s): GLUCAP in the last 168 hours. Lipid Profile: No results for input(s): CHOL, HDL, LDLCALC, TRIG, CHOLHDL, LDLDIRECT in the last 72 hours. Thyroid Function Tests: No results for input(s): TSH, T4TOTAL, FREET4, T3FREE, THYROIDAB in the last 72 hours. Anemia Panel: No results for input(s): VITAMINB12, FOLATE, FERRITIN, TIBC, IRON, RETICCTPCT in the last 72 hours. Urine analysis: No results found for: COLORURINE, APPEARANCEUR, LABSPEC, PHURINE, GLUCOSEU, HGBUR, BILIRUBINUR, KETONESUR, PROTEINUR, UROBILINOGEN, NITRITE, LEUKOCYTESUR Sepsis Labs: @LABRCNTIP (procalcitonin:4,lacticidven:4)  )No results found for this or any previous  visit (from the past 240 hours).       Radiology Studies: CT Cervical Spine Wo Contrast Result Date: 05/16/2024 CLINICAL DATA:  78 year old female status post fall from wheelchair, hypotension, dialysis patient. Headache. EXAM: CT CERVICAL SPINE WITHOUT CONTRAST TECHNIQUE: Multidetector CT imaging of the cervical spine was performed without intravenous contrast. Multiplanar CT image reconstructions were also generated. RADIATION DOSE REDUCTION: This exam was performed according to the departmental dose-optimization program which includes automated exposure control, adjustment of the mA and/or kV according to patient size and/or use of iterative reconstruction technique. COMPARISON:  Head CT today.  Cervical spine radiographs 06/16/2019. FINDINGS: Alignment: Straightening of cervical lordosis. Cervicothoracic junction alignment is within normal limits. Mild degenerative anterolisthesis of C4 on C5, C3 on C4. See additional details of those levels below. Maintained posterior element alignment. 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 Visualized skull base is intact. No atlanto-occipital dissociation. C1 and C2 are degenerated anteriorly, but appear intact and aligned. No acute osseous abnormality identified. Soft tissues and spinal canal: No prevertebral fluid or swelling. No visible canal hematoma.  Advanced calcified atherosclerosis. Bilateral thoracic inlet vascular catheters. Coarsely calcified left thyroid nodule (no follow-up imaging recommended). Otherwise negative visible noncontrast neck soft tissues. Disc levels: Degenerative ankylosis of C3-C4 and C4-C5 facets. Degenerative Interbody ankylosis at C4-C5, C5-C6, and likely also C6-C7. Chronic disc, endplate, and facet degeneration at those levels. Disc and posterior element degeneration also at the unfused C2-C3 level. Mild spinal stenosis suspected there. Upper chest: Chest CT today reported separately. IMPRESSION: 1. No acute  traumatic injury identified in the cervical spine. 2. 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 Mild multifactorial spinal stenosis suspected at C2-C3. Electronically Signed   By: VEAR Hurst M.D.   On: 05/16/2024 09:50   CT CHEST ABDOMEN PELVIS WO CONTRAST Result Date: 05/16/2024 CLINICAL DATA:  78 year old female status post fall from wheelchair, hypotension, dialysis patient. Headache. EXAM: CT CHEST, ABDOMEN AND PELVIS WITHOUT CONTRAST TECHNIQUE: Multidetector CT imaging of the chest, abdomen and pelvis was performed following the standard protocol without IV contrast. RADIATION DOSE REDUCTION: This exam was performed according to the departmental dose-optimization program which includes automated exposure control, adjustment of the mA and/or kV according to patient size and/or use of iterative reconstruction technique. COMPARISON:  Noncontrast CT Abdomen and Pelvis 06/15/2022. FINDINGS: CT CHEST FINDINGS Cardiovascular: Left chest dual lumen dialysis type catheter. Right chest Port-A-Cath. Calcified aortic atherosclerosis. Calcified coronary artery atherosclerosis and/or stents. Mild cardiomegaly, mostly biatrial enlargement. No significant pericardial effusion; there is likely chronic pericardial fluid in a sub carina recess, partially visible in 2023 also. Vascular patency is not evaluated in the absence of IV contrast. Mediastinum/Nodes: No evidence of mediastinal hematoma, mass, lymphadenopathy on this noncontrast exam. Lungs/Pleura: Major airways are patent. Increased elevation of the right hemidiaphragm and confluent right lower lobe compressive atelectasis with mild air bronchograms since 2023. No superimposed pleural effusion. No pneumothorax. No pulmonary edema. Calcified subpleural left upper lobe granuloma on series 4, image 27 (no follow-up imaging recommended). Subcentimeter left lower lobe subpleural nodule also stable, series 4,  image 21 previously (no follow-up imaging recommended). Scattered subpleural lung scarring otherwise. Musculoskeletal: Chronic severe degeneration at the shoulders, greater on the left. 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 Hyperostosis in the thoracic spine with multilevel interbody ankylosis. Maintained thoracic vertebral height. Chronic and healing bilateral rib fractures, including left lateral ribs 5 through 7, posterior ribs 10 and 11 bilaterally. No acute rib fracture is identified. CT ABDOMEN PELVIS FINDINGS Hepatobiliary: Stable and negative noncontrast liver. Diminutive or absent gallbladder. Pancreas: Partially atrophied. Spleen: Negative. Adrenals/Urinary Tract: Chronic native renal atrophy. Exophytic simple fluid density right upper pole cyst (no follow-up imaging recommended). Negative adrenal glands. Decompressed bladder. Stomach/Bowel: Nondilated large and small bowel loops. Descending and sigmoid colon diverticulosis. Less pronounced transverse colon diverticulosis. Redundant hepatic flexure. Small chronic fat containing ventral abdominal hernia is stable since 2023. Small volume retained fluid in the stomach and duodenum. No pneumoperitoneum, free fluid, or mesenteric inflammation identified. Vascular/Lymphatic: Severe Aortoiliac calcified atherosclerosis. Normal caliber abdominal aorta. Vascular patency is not evaluated in the absence of IV contrast. No lymphadenopathy identified. Reproductive: Small calcified uterine fibroids. Otherwise negative noncontrast appearance. Other: No pelvis free fluid. Musculoskeletal: Renal osteodystrophy. Moderate chronic levoconvex lumbar scoliosis. Stable vertebral height since 2023. Chronic but increased bulky subchondral cysts at the right hip joint. No acute fracture or dislocation identified. IMPRESSION: 1. No acute traumatic injury identified in the noncontrast chest, abdomen, or pelvis. 2. Chronic and healing bilateral rib fractures. Underlying renal  osteodystrophy. 3. Increased right hemidiaphragm elevation and right lower  lobe atelectasis since 2023. 4. Advanced calcified atherosclerosis. Aortic Atherosclerosis (ICD10-I70.0). Electronically Signed   By: VEAR Hurst M.D.   On: 05/16/2024 08:51   CT Head Wo Contrast Result Date: 05/16/2024 CLINICAL DATA:  78 year old female status post fall from wheelchair, hypotension, dialysis patient. Headache. EXAM: CT HEAD WITHOUT CONTRAST TECHNIQUE: Contiguous axial images were obtained from the base of the skull through the vertex without intravenous contrast. RADIATION DOSE REDUCTION: This exam was performed according to the departmental dose-optimization program which includes automated exposure control, adjustment of the mA and/or kV according to patient size and/or use of iterative reconstruction technique. COMPARISON:  Head CT 06/15/2022. FINDINGS: Brain: Chronic encephalomalacia lateral left parietooccipital lobe junction, stable since 2023. Stable cerebral volume. No midline shift, ventriculomegaly, mass effect, evidence of mass lesion, intracranial hemorrhage or evidence of cortically based acute infarction. Maintained gray-white differentiation elsewhere. Vascular: Advanced Calcified atherosclerosis at the skull base. No suspicious intracranial vascular hyperdensity. Skull: Stable, intact. Sinuses/Orbits: Visualized paranasal sinuses and mastoids are stable and well aerated. Other: Calcified scalp vessel atherosclerosis. No acute orbit or scalp soft tissue injury identified. Postoperative changes to the right globe since 2023. IMPRESSION: 1. No acute intracranial abnormality or acute traumatic injury identified. 2. 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 3. Advanced calcified atherosclerosis. Electronically Signed   By: VEAR Hurst M.D.   On: 05/16/2024 08:40        Scheduled Meds:  buprenorphine   1 patch Transdermal Weekly   Chlorhexidine  Gluconate Cloth  6 each  Topical Daily   cinacalcet   90 mg Oral Q supper   clopidogrel   75 mg Oral Daily   feeding supplement  237 mL Oral BID BM   heparin   5,000 Units Subcutaneous Q8H   metoprolol  tartrate  12.5 mg Oral BID   midodrine   10 mg Oral TID WC   pantoprazole   40 mg Oral Daily   sodium chloride  flush  10-40 mL Intracatheter Q12H   Continuous Infusions:  calcium  gluconate 2,000 mg (05/17/24 1045)   potassium chloride  10 mEq (05/17/24 0930)     LOS: 0 days     Devaughn KATHEE Ban, MD [facility]   If 7PM-7AM, please contact night-coverage [email or web address] Password [email or web address] 05/17/2024, 10:55 AM

## 2024-05-18 DIAGNOSIS — Y92009 Unspecified place in unspecified non-institutional (private) residence as the place of occurrence of the external cause: Secondary | ICD-10-CM | POA: Diagnosis not present

## 2024-05-18 DIAGNOSIS — E876 Hypokalemia: Secondary | ICD-10-CM | POA: Diagnosis not present

## 2024-05-18 DIAGNOSIS — W19XXXA Unspecified fall, initial encounter: Secondary | ICD-10-CM | POA: Diagnosis not present

## 2024-05-18 LAB — BASIC METABOLIC PANEL WITH GFR
Anion gap: 13 (ref 5–15)
BUN: 35 mg/dL — ABNORMAL HIGH (ref 8–23)
CO2: 23 mmol/L (ref 22–32)
Calcium: 5.4 mg/dL — CL (ref 8.9–10.3)
Chloride: 106 mmol/L (ref 98–111)
Creatinine, Ser: 5.3 mg/dL — ABNORMAL HIGH (ref 0.44–1.00)
GFR, Estimated: 8 mL/min — ABNORMAL LOW (ref >60)
Glucose, Bld: 81 mg/dL (ref 70–99)
Potassium: 4.4 mmol/L (ref 3.5–5.1)
Sodium: 142 mmol/L (ref 135–145)

## 2024-05-18 LAB — MAGNESIUM: Magnesium: 1.9 mg/dL (ref 1.7–2.4)

## 2024-05-18 LAB — COMPREHENSIVE METABOLIC PANEL WITH GFR
ALT: <5 U/L (ref 0–44)
AST: 30 U/L (ref 15–41)
Albumin: 2.8 g/dL — ABNORMAL LOW (ref 3.5–5.0)
Alkaline Phosphatase: 210 U/L — ABNORMAL HIGH (ref 38–126)
Anion gap: 16 — ABNORMAL HIGH (ref 5–15)
BUN: 22 mg/dL (ref 8–23)
CO2: 23 mmol/L (ref 22–32)
Calcium: 5.8 mg/dL — CL (ref 8.9–10.3)
Chloride: 104 mmol/L (ref 98–111)
Creatinine, Ser: 4 mg/dL — ABNORMAL HIGH (ref 0.44–1.00)
GFR, Estimated: 11 mL/min — ABNORMAL LOW (ref >60)
Glucose, Bld: 99 mg/dL (ref 70–99)
Potassium: 2.6 mmol/L — CL (ref 3.5–5.1)
Sodium: 143 mmol/L (ref 135–145)
Total Bilirubin: 1.3 mg/dL — ABNORMAL HIGH (ref 0.0–1.2)
Total Protein: 6 g/dL — ABNORMAL LOW (ref 6.5–8.1)

## 2024-05-18 LAB — CBC
HCT: 33.3 % — ABNORMAL LOW (ref 36.0–46.0)
Hemoglobin: 10.4 g/dL — ABNORMAL LOW (ref 12.0–15.0)
MCH: 26.8 pg (ref 26.0–34.0)
MCHC: 31.2 g/dL (ref 30.0–36.0)
MCV: 85.8 fL (ref 80.0–100.0)
Platelets: 266 K/uL (ref 150–400)
RBC: 3.88 MIL/uL (ref 3.87–5.11)
RDW: 22.1 % — ABNORMAL HIGH (ref 11.5–15.5)
WBC: 8.4 K/uL (ref 4.0–10.5)
nRBC: 0 % (ref 0.0–0.2)

## 2024-05-18 MED ORDER — MIDODRINE HCL 5 MG PO TABS
ORAL_TABLET | ORAL | Status: AC
Start: 2024-05-18 — End: 2024-05-18
  Filled 2024-05-18: qty 1

## 2024-05-18 MED ORDER — RENA-VITE PO TABS
1.0000 | ORAL_TABLET | Freq: Every day | ORAL | Status: DC
  Administered 2024-05-18 – 2024-05-20 (×3): 1 via ORAL
  Filled 2024-05-18 (×4): qty 1

## 2024-05-18 MED ORDER — HEPARIN SODIUM (PORCINE) 1000 UNIT/ML DIALYSIS: 1000.0000 [IU] | INTRAMUSCULAR | Status: DC | PRN

## 2024-05-18 MED ORDER — HEPARIN SODIUM (PORCINE) 1000 UNIT/ML IJ SOLN
INTRAMUSCULAR | Status: AC
  Filled 2024-05-18: qty 5

## 2024-05-18 MED ORDER — NEPRO/CARBSTEADY PO LIQD
237.0000 mL | Freq: Two times a day (BID) | ORAL | Status: DC
  Administered 2024-05-18 – 2024-05-21 (×3): 237 mL via ORAL

## 2024-05-18 MED ORDER — ALTEPLASE 2 MG IJ SOLR: 2.0000 mg | Freq: Once | INTRAMUSCULAR | Status: DC | PRN

## 2024-05-18 MED ORDER — MIDODRINE HCL 5 MG PO TABS
5.0000 mg | ORAL_TABLET | ORAL | Status: DC
Start: 2024-05-18 — End: 2024-05-21
  Administered 2024-05-18 – 2024-05-20 (×3): 5 mg via ORAL

## 2024-05-18 MED ORDER — CALCITRIOL 0.25 MCG PO CAPS
0.5000 ug | ORAL_CAPSULE | Freq: Every day | ORAL | Status: DC
  Administered 2024-05-18 – 2024-05-21 (×4): 0.5 ug via ORAL
  Filled 2024-05-18 (×4): qty 2

## 2024-05-18 MED ORDER — CALCIUM CARBONATE 1250 (500 CA) MG PO TABS
1.0000 | ORAL_TABLET | Freq: Two times a day (BID) | ORAL | Status: DC
  Administered 2024-05-18 – 2024-05-21 (×5): 1250 mg via ORAL
  Filled 2024-05-18 (×7): qty 1

## 2024-05-18 MED ORDER — MEDIHONEY WOUND/BURN DRESSING EX PSTE
1.0000 | PASTE | Freq: Every day | CUTANEOUS | Status: DC
  Administered 2024-05-18 – 2024-05-21 (×4): 1 via TOPICAL
  Filled 2024-05-18: qty 44

## 2024-05-18 NOTE — Progress Notes (Addendum)
  Pt receives outpt HD at [facility] on MWF. Navigator following to assist with any HD needs.  Suzen Satchel Dialysis Navigator [phone]

## 2024-05-18 NOTE — TOC Initial Note (Signed)
 Transition of Care [facility]) - Initial/Assessment Note    Patient Details  Name: Connie Olsen MRN: [id number] Date of Birth: 09-16-1945  [facility]) CM/SW Contact:    Alfonso Rummer, LCSW Phone Number: 05/18/2024, 5:17 PM  Clinical Narrative:                  KEN DELENA Rummer completed fl2 and bed search. Per Dr. Kandis pt prefers [facility]. Chart review completed no further toc needs identified. TOC will follow up to assist with safe discharge planning.        Patient Goals and CMS Choice            Expected Discharge Plan and Services                                              Prior Living Arrangements/Services                       Activities of Daily Living   ADL Screening (condition at time of admission) Independently performs ADLs?: Yes (appropriate for developmental age) Is the patient deaf or have difficulty hearing?: No Does the patient have difficulty seeing, even when wearing glasses/contacts?: No Does the patient have difficulty concentrating, remembering, or making decisions?: No  Permission Sought/Granted                  Emotional Assessment              Admission diagnosis:  Hypocalcemia [E83.51] Hypokalemia [E87.6] Hypomagnesemia [E83.42] A-fib (HCC) [I48.91] Chronic atrial fibrillation (HCC) [I48.20] Generalized weakness [R53.1] Closed head injury, initial encounter [S09.90XA] Patient Active Problem List   Diagnosis Date Noted   History of GI bleed 05/17/2024   Fall at home, initial encounter 05/16/2024   PAD (peripheral artery disease) (HCC) 04/12/2023   Erythrocytosis 09/06/2020   Chronic hypotension 06/16/2020   Complication associated with dialysis catheter 06/16/2020   Hyperlipidemia 06/16/2020   Ulcerative colitis (HCC) 06/16/2020   Bilateral hand pain 02/02/2020   Bilateral hand numbness 12/10/2019   Burning sensation 12/10/2019   Abnormality of albumin  10/15/2019   Allergy,  unspecified, initial encounter 10/13/2019   Anaphylactic shock, unspecified, initial encounter 10/13/2019   Hyperparathyroidism (HCC) 09/30/2019   Hyperphosphatemia 09/30/2019   Neuropathy 09/30/2019   Depression 09/30/2019   Port-A-Cath in place 06/23/2019   Pruritus, unspecified 09/29/2018   Insomnia 06/20/2017   Seborrheic keratoses 06/20/2017   Primary hypercoagulable state (HCC) 05/21/2017   Other specified complication of vascular prosthetic devices, implants and grafts, initial encounter (HCC) 05/01/2017   Shortness of breath 02/27/2017   Other chronic pain 09/22/2016   Irritable bowel syndrome 05/24/2016   Barrett's esophagus without dysplasia 05/16/2016   Encounter for fitting and adjustment of extracorporeal dialysis catheter (HCC) 05/16/2016   Unspecified open wound of left forearm, initial encounter 01/02/2016   Osteoporosis 11/30/2015   Hypocalcemia 11/04/2015   Mild protein-calorie malnutrition (HCC) 06/20/2015   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   Encounter for immunization 06/16/2015   Endometrial thickening on ultrasound 04/19/2015   Generalized abdominal pain 04/04/2015   H/O female genital system disorder 03/08/2015   H/O chronic ulcerative colitis 03/08/2015   Poor venous access 03/04/2015   Anemia in chronic kidney disease 02/12/2015   Bacteremia 02/12/2015   Coagulation defect, unspecified (HCC) 02/12/2015   Type  2 diabetes mellitus with other diabetic kidney complication (HCC) 02/12/2015   Hypokalemia 02/12/2015   Iron deficiency anemia, unspecified 02/12/2015   Other ascites 02/12/2015   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 [place] [address] Phone: [phone] Fax: [phone]     Social Drivers of Health (SDOH) Social History: SDOH Screenings   Food Insecurity: No Food Insecurity (05/16/2024)  Housing: Low Risk  (05/16/2024)  Transportation Needs: No Transportation Needs (05/16/2024)  Utilities: Not At Risk (05/16/2024)  Financial Resource Strain: Low Risk  (01/14/2024)   Received from [facility]  Social Connections: Unknown (05/16/2024)  Tobacco Use: Low Risk  (05/16/2024)   SDOH Interventions:     Readmission Risk Interventions     No data to display

## 2024-05-18 NOTE — Consult Note (Signed)
 [facility]  CONSULT NOTE    Date: 05/18/2024                  Patient Name:  Connie Olsen  MRN: [id number]  DOB: 09/10/1945  Age / Sex: 78 y.o., female         PCP: [facility]                 Service Requesting Consult: TRH                 Reason for Consult: End stage renal disease on hemodialysis            History of Present Illness: Connie Olsen is a 78 y.o.  female with type 2 diabetes, HTN, HLD, afib, and end stage renal disease on hemodialysis, who was admitted to [facility] on 05/16/2024 for Hypocalcemia [E83.51] Hypokalemia [E87.6] Hypomagnesemia [E83.42] A-fib (HCC) [I48.91] Chronic atrial fibrillation (HCC) [I48.20] Generalized weakness [R53.1] Closed head injury, initial encounter [S09.90XA]  Patient presents to ED after suffering a fall at home. Patient seen and evaluated during dialysis   HEMODIALYSIS FLOWSHEET:  Blood Flow Rate (mL/min): 200 mL/min Arterial Pressure (mmHg): -116.75 mmHg Venous Pressure (mmHg): 37.38 mmHg TMP (mmHg): -8.48 mmHg Ultrafiltration Rate (mL/min): 703 mL/min Dialysate Flow Rate (mL/min): 300 ml/min  States she was going into her kitchen to get something to eat. She remembers being in her wheelchair and then she finds herself on the floor with a bump on her head. She thought she dozed off but says her labs have been out of wack and maybe she had passed out. She denies any shortness of breath or dizziness.   Labs on ED arrival concerning for potassium 2.5, calcium  6.1, mg 1.5, troponin 41, and hgb 10.9.Imaging negative for acute findings.    Medications: Outpatient medications: Medications Prior to Admission  Medication Sig Dispense Refill Last Dose/Taking   acetaminophen  (TYLENOL ) 650 MG CR tablet Take 650 mg by mouth every 8 (eight) hours as needed for pain.   Unknown   B Complex-C-Folic Acid  (DIALYVITE TABLET) TABS Take 1 tablet by mouth at bedtime.   05/15/2024   buprenorphine   (BUTRANS ) 10 MCG/HR PTWK Place 1 patch onto the skin once a week.   05/10/2024   cinacalcet  (SENSIPAR ) 90 MG tablet Take 90 mg by mouth See admin instructions. Take 90 mg by mouth daily with largest meal of the day.  11 05/15/2024   clopidogrel  (PLAVIX ) 75 MG tablet TAKE 1 TABLET BY MOUTH EVERY DAY 90 tablet 1 05/15/2024   lidocaine  (LIDODERM ) 5 % Place 2 patches onto the skin daily.   Unknown   lidocaine -prilocaine (EMLA) cream Apply 1 application topically daily as needed (prior to port use).   Unknown   metoprolol  tartrate (LOPRESSOR ) 25 MG tablet Take 12.5 mg by mouth 2 (two) times daily as needed.  1 Unknown   midodrine  (PROAMATINE ) 10 MG tablet Take 10 mg by mouth 3 (three) times daily.   05/15/2024   naloxone (NARCAN) nasal spray 4 mg/0.1 mL Place into the nose.   Unknown   pantoprazole  (PROTONIX ) 40 MG tablet Take 1 tablet (40 mg total) by mouth daily. 30 tablet 11 05/15/2024   pregabalin  (LYRICA ) 25 MG capsule Take 25 mg by mouth 3 (three) times daily.   05/15/2024   sodium chloride  (PF) 0.9 % SOLN 1 mL with cefTAZidime 1 mg/mL SOLN 1.25 mg, heparin  sodium (porcine) 1000 UNIT/ML SOLN 250 Units Inject into the vein.  Unknown   vancomycin (VANCOCIN) 750 MG SOLR injection Vancomycin 750 mg IV with each dialysis session (every Monday, Wednesday, Friday) for skin and soft tissue infection left lower extremity.  MRSA positive culture.  Nephrology to dose and adjust per weekly vancomycin troughs and surveillance labs; CBC and BMP.   Unknown   amoxicillin (AMOXIL) 500 MG capsule Take 500 mg by mouth 3 (three) times daily. (Patient not taking: Reported on 05/16/2024)   Not Taking   aspirin  EC 81 MG tablet Take 81 mg by mouth at bedtime. (Patient not taking: Reported on 05/16/2024)   Not Taking   diphenhydrAMINE  (BENADRYL  ALLERGY) 25 mg capsule Take by mouth. (Patient not taking: Reported on 05/16/2024)   Not Taking   ferric citrate  (AURYXIA) 1 GM 210 MG(Fe) tablet Take 420 mg by mouth 3 (three) times daily  with meals. (Patient not taking: Reported on 05/16/2024)   Not Taking   ibuprofen  (ADVIL ) 200 MG tablet Take 200-400 mg by mouth every 6 (six) hours as needed for moderate pain. (Patient not taking: Reported on 05/16/2024)   Not Taking   Zinc Sulfate 220 (50 Zn) MG TABS Take by mouth. (Patient not taking: Reported on 05/16/2024)   Not Taking    Current medications: Current Facility-Administered Medications  Medication Dose Route Frequency Provider Last Rate Last Admin   acetaminophen  (TYLENOL ) tablet 650 mg  650 mg Oral Q6H PRN Wouk, Devaughn Sayres, MD       Or   acetaminophen  (TYLENOL ) suppository 650 mg  650 mg Rectal Q6H PRN Wouk, Devaughn Sayres, MD       alteplase  (CATHFLO ACTIVASE ) injection 2 mg  2 mg Intracatheter Once PRN Druscilla Bald, NP       buprenorphine  (BUTRANS ) 10 MCG/HR 1 patch  1 patch Transdermal Weekly Paudel, Nena, MD   1 patch at 05/17/24 1226   calcitRIOL  (ROCALTROL ) capsule 0.5 mcg  0.5 mcg Oral Daily Dennise Capri, MD       Chlorhexidine  Gluconate Cloth 2 % PADS 6 each  6 each Topical Daily Paudel, Keshab, MD   6 each at 05/17/24 1629   clopidogrel  (PLAVIX ) tablet 75 mg  75 mg Oral Daily Paudel, Nena, MD   75 mg at 05/17/24 0803   feeding supplement (ENSURE PLUS HIGH PROTEIN) liquid 237 mL  237 mL Oral BID BM Paudel, Keshab, MD       feeding supplement (NEPRO CARB STEADY) liquid 237 mL  237 mL Oral BID BM Wouk, Devaughn Sayres, MD       heparin  injection 1,000 Units  1,000 Units Intracatheter PRN Druscilla Bald, NP       heparin  injection 5,000 Units  5,000 Units Subcutaneous Q8H Paudel, Nena, MD   5,000 Units at 05/16/24 1356   leptospermum manuka honey (MEDIHONEY) paste 1 Application  1 Application Topical Daily Wouk, Devaughn Sayres, MD       metoprolol  tartrate (LOPRESSOR ) tablet 12.5 mg  12.5 mg Oral BID Paudel, Keshab, MD       midodrine  (PROAMATINE ) tablet 10 mg  10 mg Oral TID WC Paudel, Keshab, MD   10 mg at 05/18/24 0641   midodrine  (PROAMATINE ) tablet 5 mg   5 mg Oral Q M,W,F-HD Druscilla Bald, NP   5 mg at 05/18/24 9177   multivitamin (RENA-VIT) tablet 1 tablet  1 tablet Oral QHS Wouk, Devaughn Sayres, MD       ondansetron  (ZOFRAN ) tablet 4 mg  4 mg Oral Q6H PRN Paudel, Keshab, MD  Or   ondansetron  (ZOFRAN ) injection 4 mg  4 mg Intravenous Q6H PRN Paudel, Keshab, MD       pantoprazole  (PROTONIX ) EC tablet 40 mg  40 mg Oral Daily Paudel, Keshab, MD   40 mg at 05/17/24 0803   polyethylene glycol (MIRALAX  / GLYCOLAX ) packet 17 g  17 g Oral Daily PRN Paudel, Keshab, MD       pregabalin  (LYRICA ) capsule 25 mg  25 mg Oral TID Kandis Devaughn Sayres, MD   25 mg at 05/17/24 2110   sodium chloride  flush (NS) 0.9 % injection 10-40 mL  10-40 mL Intracatheter Q12H Paudel, Keshab, MD   10 mL at 05/17/24 2230   sodium chloride  flush (NS) 0.9 % injection 10-40 mL  10-40 mL Intracatheter PRN Paudel, Keshab, MD       Facility-Administered Medications Ordered in Other Encounters  Medication Dose Route Frequency Provider Last Rate Last Admin   sodium chloride  flush (NS) 0.9 % injection 10 mL  10 mL Intravenous PRN Corcoran, Melissa C, MD   10 mL at 11/22/20 1314      Allergies: Allergies  Allergen Reactions   Aspirin  Other (See Comments)    Ulcerative colitis: UNABLE TO TOLERATE IF NOT ENTERIC COATED   Naltrexone Other (See Comments)   Librax  [Chlordiazepoxide-Clidinium] Anxiety    agitation   Povidone-Iodine  Itching and Rash    Only problem if it is topical, tolerates injected contrast   Tape Itching      Past Medical History: 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   Endometrial thickening on ultra sound 04/19/2015   History of ulcerative colitis    History of uterine fibroid    Osteoporosis      Past Surgical History: Past Surgical History:  Procedure Laterality Date   CHOLECYSTECTOMY      colonoscopy  05/08/2016   DIALYSIS/PERMA CATHETER REPAIR     LAPAROSCOPIC PARTIAL HEPATECTOMY     LOWER EXTREMITY ANGIOGRAPHY Right 04/23/2023   Procedure: Lower Extremity Angiography;  Surgeon: Jama Cordella MATSU, MD;  Location: [facility] INVASIVE CV LAB;  Service: Cardiovascular;  Laterality: Right;   MASTECTOMY PARTIAL / LUMPECTOMY     PORTA CATH INSERTION N/A 06/06/2021   Procedure: PORTA CATH INSERTION;  Surgeon: Jama Cordella MATSU, MD;  Location: [facility] INVASIVE CV LAB;  Service: Cardiovascular;  Laterality: N/A;   UPPER GI ENDOSCOPY  05/08/2016     Family History: Family History  Problem Relation Age of Onset   Breast cancer Mother      Social History: Social History   Socioeconomic History   Marital status: Legally Separated    Spouse name: Not on file   Number of children: Not on file   Years of education: Not on file   Highest education level: Not on file  Occupational History   Not on file  Tobacco Use   Smoking status: Never   Smokeless tobacco: Never  Vaping Use   Vaping status: Never Used  Substance and Sexual Activity   Alcohol use: No    Alcohol/week: 0.0 standard drinks of alcohol   Drug use: No   Sexual activity: Not on file  Other Topics Concern   Not on file  Social History Narrative   Not on file   Social Drivers of Health   Financial Resource Strain: Low Risk  (01/14/2024)  Received from [facility]   Overall Financial Resource Strain (CARDIA)    Difficulty of Paying Living Expenses: Not hard at all  Food Insecurity: No Food Insecurity (05/16/2024)   Hunger Vital Sign    Worried About Running Out of Food in the Last Year: Never true    Ran Out of Food in the Last Year: Never true  Transportation Needs: No Transportation Needs (05/16/2024)   PRAPARE - Administrator, Civil Service (Medical): No    Lack of Transportation (Non-Medical): No  Physical Activity: Not on file  Stress: Not on file  Social Connections: Unknown (05/16/2024)    Social Connection and Isolation Panel    Frequency of Communication with Friends and Family: More than three times a week    Frequency of Social Gatherings with Friends and Family: More than three times a week    Attends Religious Services: More than 4 times per year    Active Member of [employer] or Organizations: Yes    Attends Banker Meetings: More than 4 times per year    Marital Status: Patient declined  Intimate Partner Violence: Not At Risk (05/16/2024)   Humiliation, Afraid, Rape, and Kick questionnaire    Fear of Current or Ex-Partner: No    Emotionally Abused: No    Physically Abused: No    Sexually Abused: No     Review of Systems: Review of Systems  Constitutional:  Negative for chills, fever and malaise/fatigue.  HENT:  Negative for congestion, sore throat and tinnitus.   Eyes:  Negative for blurred vision and redness.  Respiratory:  Negative for cough, shortness of breath and wheezing.   Cardiovascular:  Negative for chest pain, palpitations, claudication and leg swelling.  Gastrointestinal:  Negative for abdominal pain, blood in stool, diarrhea, nausea and vomiting.  Genitourinary:  Negative for flank pain, frequency and hematuria.  Musculoskeletal:  Positive for falls. Negative for back pain and myalgias.  Skin:  Negative for rash.  Neurological:  Negative for dizziness, weakness and headaches.  Endo/Heme/Allergies:  Does not bruise/bleed easily.  Psychiatric/Behavioral:  Negative for depression. The patient is not nervous/anxious and does not have insomnia.     Vital Signs: Blood pressure 111/85, pulse (!) 56, temperature 97.6 F (36.4 C), temperature source Oral, resp. rate 18, height 4' 11 (1.499 m), weight 55 kg, SpO2 96%.  Weight trends: Filed Weights   05/16/24 1254 05/18/24 0752 05/18/24 1147  Weight: 63.5 kg 57.2 kg 55 kg    Physical Exam: General: NAD  Head: Normocephalic, atraumatic. Moist oral mucosal membranes  Eyes: Anicteric   Lungs:  Clear to auscultation, normal effort  Heart: Regular rate and rhythm  Abdomen:  Soft, nontender  Extremities:  trace peripheral edema.  Neurologic: Awake and alert  Skin: BLE ulcerations, old blisters  Access: Lt Permcath     Lab results: Basic Metabolic Panel: Recent Labs  Lab 05/16/24 0748 05/17/24 0535 05/18/24 0630  NA 144 143 142  K 2.5* 2.6* 4.4  CL 102 104 106  CO2 22 23 23   GLUCOSE 101* 99 81  BUN 13 22 35*  CREATININE 2.68* 4.00* 5.30*  CALCIUM  6.1* 5.8* 5.4*  MG 1.5* 2.1 1.9    Liver Function Tests: Recent Labs  Lab 05/16/24 0748 05/17/24 0535  AST 33 30  ALT <5 <5  ALKPHOS 196* 210*  BILITOT 1.4* 1.3*  PROT 6.5 6.0*  ALBUMIN  3.0* 2.8*   No results for input(s): LIPASE, AMYLASE in the last 168 hours.  No results for input(s): AMMONIA in the last 168 hours.  CBC: Recent Labs  Lab 05/16/24 0748 05/17/24 0535 05/18/24 0800  WBC 10.5 8.6 8.4  NEUTROABS 8.7*  --   --   HGB 10.9* 10.4* 10.4*  HCT 35.1* 32.3* 33.3*  MCV 85.0 84.1 85.8  PLT 264 230 266    Cardiac Enzymes: No results for input(s): CKTOTAL, CKMB, CKMBINDEX, TROPONINI in the last 168 hours.  BNP: Invalid input(s): POCBNP  CBG: No results for input(s): GLUCAP in the last 168 hours.  Microbiology: Results for orders placed or performed during the hospital encounter of 06/15/22  Resp Panel by RT-PCR (Flu A&B, Covid) Anterior Nasal Swab     Status: None   Collection Time: 06/15/22 11:46 AM   Specimen: Anterior Nasal Swab  Result Value Ref Range Status   SARS Coronavirus 2 by RT PCR NEGATIVE NEGATIVE Final    Comment: (NOTE) SARS-CoV-2 target nucleic acids are NOT DETECTED.  The SARS-CoV-2 RNA is generally detectable in upper respiratory specimens during the acute phase of infection. The lowest concentration of SARS-CoV-2 viral copies this assay can detect is 138 copies/mL. A negative result does not preclude SARS-Cov-2 infection and should not be  used as the sole basis for treatment or other patient management decisions. A negative result may occur with  improper specimen collection/handling, submission of specimen other than nasopharyngeal swab, presence of viral mutation(s) within the areas targeted by this assay, and inadequate number of viral copies(<138 copies/mL). A negative result must be combined with clinical observations, patient history, and epidemiological information. The expected result is Negative.  Fact Sheet for Patients:  BloggerCourse.com  Fact Sheet for Healthcare Providers:  SeriousBroker.it  This test is no t yet approved or cleared by the [place]  FDA and  has been authorized for detection and/or diagnosis of SARS-CoV-2 by FDA under an Emergency Use Authorization (EUA). This EUA will remain  in effect (meaning this test can be used) for the duration of the COVID-19 declaration under Section 564(b)(1) of the Act, 21 U.S.C.section 360bbb-3(b)(1), unless the authorization is terminated  or revoked sooner.       Influenza A by PCR NEGATIVE NEGATIVE Final   Influenza B by PCR NEGATIVE NEGATIVE Final    Comment: (NOTE) The Xpert Xpress SARS-CoV-2/FLU/RSV plus assay is intended as an aid in the diagnosis of influenza from Nasopharyngeal swab specimens and should not be used as a sole basis for treatment. Nasal washings and aspirates are unacceptable for Xpert Xpress SARS-CoV-2/FLU/RSV testing.  Fact Sheet for Patients: BloggerCourse.com  Fact Sheet for Healthcare Providers: SeriousBroker.it  This test is not yet approved or cleared by the [place]  FDA and has been authorized for detection and/or diagnosis of SARS-CoV-2 by FDA under an Emergency Use Authorization (EUA). This EUA will remain in effect (meaning this test can be used) for the duration of the COVID-19 declaration under Section  564(b)(1) of the Act, 21 U.S.C. section 360bbb-3(b)(1), unless the authorization is terminated or revoked.  Performed at [facility], [address] [place], [place] [address]     Coagulation Studies: Recent Labs    05/17/24 0535  LABPROT 15.7*  INR 1.2    Urinalysis: No results for input(s): COLORURINE, LABSPEC, PHURINE, GLUCOSEU, HGBUR, BILIRUBINUR, KETONESUR, PROTEINUR, UROBILINOGEN, NITRITE, LEUKOCYTESUR in the last 72 hours.  Invalid input(s): APPERANCEUR    Imaging: No results found.   Assessment & Plan: Connie Olsen is a 78 y.o.  female with type 2 diabetes, HTN, HLD,  afib, and end stage renal disease on hemodialysis, who was admitted to [facility] on 05/16/2024 for Hypocalcemia [E83.51] Hypokalemia [E87.6] Hypomagnesemia [E83.42] A-fib (HCC) [I48.91] Chronic atrial fibrillation (HCC) [I48.20] Generalized weakness [R53.1] Closed head injury, initial encounter [S09.90XA]  End-stage renal disease on hemodialysis.  Last treatment completed on Friday.  Patient receiving dialysis today, UF goal 1.5 L.  Patient receives midodrine  5 mg as needed with outpatient dialysis.  Will continue this order here.  Next treatment scheduled for Wednesday.  2. Hypokalemia, potassium 2.5 on admission. Corrected with IV potassium. Also dialyzed on 3K bath during treatment.   3. Secondary Hyperparathyroidism: with outpatient labs: PTH 327, phosphorus 240, calcium  7.2 on date.   Lab Results  Component Value Date   PTH 370 (H) 11/26/2023   CALCIUM  5.4 (LL) 05/18/2024    Chronic hypocalcemia, corrected calcium  6.4. Will order calcium  carbonate 1 tab twice daily.   4. Anemia of chronic kidney disease Lab Results  Component Value Date   HGB 10.4 (L) 05/18/2024   Hgb within optimal range. Will continue to monitor for need of ESA.   LOS: 1 Troy Kanouse 9/15/202512:09 PM

## 2024-05-18 NOTE — [place] FL2 (Signed)
 [place]  MEDICAID FL2 LEVEL OF CARE FORM     IDENTIFICATION  Patient Name: Connie Olsen Birthdate: August 12, 1946 Sex: female Admission Date (Current Location): 05/16/2024  [facility] and [place] Number:  Chiropodist and Address:  [facility], [address], [place], [place] [address]      Provider Number: [id number]  Attending Physician Name and Address:  Kandis Devaughn Sayres, MD  Relative Name and Phone Number:  Dynasia Kercheval [phone]    Current Level of Care: Hospital Recommended Level of Care: Skilled Nursing Facility Prior Approval Number:    Date Approved/Denied:   PASRR Number: [id number]  Discharge Plan: SNF    Current Diagnoses: Patient Active Problem List   Diagnosis Date Noted   History of GI bleed 05/17/2024   Fall at home, initial encounter 05/16/2024   PAD (peripheral artery disease) (HCC) 04/12/2023   Erythrocytosis 09/06/2020   Chronic hypotension 06/16/2020   Complication associated with dialysis catheter 06/16/2020   Hyperlipidemia 06/16/2020   Ulcerative colitis (HCC) 06/16/2020   Bilateral hand pain 02/02/2020   Bilateral hand numbness 12/10/2019   Burning sensation 12/10/2019   Abnormality of albumin  10/15/2019   Allergy, unspecified, initial encounter 10/13/2019   Anaphylactic shock, unspecified, initial encounter 10/13/2019   Hyperparathyroidism (HCC) 09/30/2019   Hyperphosphatemia 09/30/2019   Neuropathy 09/30/2019   Depression 09/30/2019   Port-A-Cath in place 06/23/2019   Pruritus, unspecified 09/29/2018   Insomnia 06/20/2017   Seborrheic keratoses 06/20/2017   Primary hypercoagulable state (HCC) 05/21/2017   Other specified complication of vascular prosthetic devices, implants and grafts, initial encounter (HCC) 05/01/2017   Shortness of breath 02/27/2017   Other chronic pain 09/22/2016   Irritable bowel syndrome 05/24/2016   Barrett's esophagus without dysplasia 05/16/2016   Encounter for  fitting and adjustment of extracorporeal dialysis catheter (HCC) 05/16/2016   Unspecified open wound of left forearm, initial encounter 01/02/2016   Osteoporosis 11/30/2015   Hypocalcemia 11/04/2015   Mild protein-calorie malnutrition (HCC) 06/20/2015   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   Encounter for immunization 06/16/2015   Endometrial thickening on ultrasound 04/19/2015   Generalized abdominal pain 04/04/2015   H/O female genital system disorder 03/08/2015   H/O chronic ulcerative colitis 03/08/2015   Poor venous access 03/04/2015   Anemia in chronic kidney disease 02/12/2015   Bacteremia 02/12/2015   Coagulation defect, unspecified (HCC) 02/12/2015   Type 2 diabetes mellitus with other diabetic kidney complication (HCC) 02/12/2015   Hypokalemia 02/12/2015   Iron deficiency anemia, unspecified 02/12/2015   Other ascites 02/12/2015   Secondary hyperparathyroidism of renal origin (HCC) 02/12/2015   BP (high blood pressure) 11/16/2014   Adiposity 11/16/2014   Encounter for removal of sutures 11/16/2014   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   A-fib (HCC) 02/04/2014   End stage renal disease (HCC) 02/04/2014    Orientation RESPIRATION BLADDER Height & Weight     Self, Time, Situation, Place  Normal Continent Weight: 121 lb 4.1 oz (55 kg) Height:  4' 11 (149.9 cm)  BEHAVIORAL SYMPTOMS/MOOD NEUROLOGICAL BOWEL NUTRITION STATUS      Continent Diet (iet 2 gram sodium Fluid consistency: Thin: Sodium Restricted)  AMBULATORY STATUS COMMUNICATION OF NEEDS Skin   Supervision Verbally Normal                       Personal Care Assistance Level of Assistance  Bathing, Feeding, Dressing Bathing Assistance: Limited  assistance Feeding assistance: Limited assistance Dressing Assistance: Limited assistance     Functional Limitations Info             SPECIAL CARE FACTORS FREQUENCY  PT (By  licensed PT), OT (By licensed OT)     PT Frequency: 5x OT Frequency: 5x            Contractures Contractures Info: Not present    Additional Factors Info                  Current Medications (05/18/2024):  This is the current hospital active medication list Current Facility-Administered Medications  Medication Dose Route Frequency Provider Last Rate Last Admin   acetaminophen  (TYLENOL ) tablet 650 mg  650 mg Oral Q6H PRN Wouk, Devaughn Sayres, MD       Or   acetaminophen  (TYLENOL ) suppository 650 mg  650 mg Rectal Q6H PRN Wouk, Devaughn Sayres, MD       buprenorphine  (BUTRANS ) 10 MCG/HR 1 patch  1 patch Transdermal Weekly Paudel, Nena, MD   1 patch at 05/17/24 1226   calcitRIOL  (ROCALTROL ) capsule 0.5 mcg  0.5 mcg Oral Daily Dennise Capri, MD   0.5 mcg at 05/18/24 1228   calcium  carbonate (OS-CAL - dosed in mg of elemental calcium ) tablet 1,250 mg  1 tablet Oral BID WC Dennise Capri, MD   1,250 mg at 05/18/24 1645   Chlorhexidine  Gluconate Cloth 2 % PADS 6 each  6 each Topical Daily Paudel, Nena, MD   6 each at 05/18/24 1230   clopidogrel  (PLAVIX ) tablet 75 mg  75 mg Oral Daily Paudel, Keshab, MD   75 mg at 05/18/24 1229   feeding supplement (ENSURE PLUS HIGH PROTEIN) liquid 237 mL  237 mL Oral BID BM Paudel, Keshab, MD   237 mL at 05/18/24 1432   feeding supplement (NEPRO CARB STEADY) liquid 237 mL  237 mL Oral BID BM Wouk, Devaughn Sayres, MD   237 mL at 05/18/24 1432   heparin  injection 5,000 Units  5,000 Units Subcutaneous Q8H Paudel, Nena, MD   5,000 Units at 05/16/24 1356   leptospermum manuka honey (MEDIHONEY) paste 1 Application  1 Application Topical Daily Kandis Devaughn Sayres, MD   1 Application at 05/18/24 1231   metoprolol  tartrate (LOPRESSOR ) tablet 12.5 mg  12.5 mg Oral BID Paudel, Nena, MD       midodrine  (PROAMATINE ) tablet 10 mg  10 mg Oral TID WC Paudel, Keshab, MD   10 mg at 05/18/24 1645   midodrine  (PROAMATINE ) tablet 5 mg  5 mg Oral Q M,W,F-HD Druscilla Bald, NP   5 mg at 05/18/24 9177   multivitamin (RENA-VIT) tablet 1 tablet  1 tablet Oral QHS Wouk, Devaughn Sayres, MD       ondansetron  (ZOFRAN ) tablet 4 mg  4 mg Oral Q6H PRN Paudel, Keshab, MD       Or   ondansetron  (ZOFRAN ) injection 4 mg  4 mg Intravenous Q6H PRN Paudel, Nena, MD       pantoprazole  (PROTONIX ) EC tablet 40 mg  40 mg Oral Daily Paudel, Keshab, MD   40 mg at 05/17/24 0803   polyethylene glycol (MIRALAX  / GLYCOLAX ) packet 17 g  17 g Oral Daily PRN Paudel, Keshab, MD       pregabalin  (LYRICA ) capsule 25 mg  25 mg Oral TID Kandis Devaughn Sayres, MD   25 mg at 05/18/24 1645   sodium chloride  flush (NS) 0.9 % injection 10-40 mL  10-40 mL Intracatheter Q12H Paudel,  Nena, MD   10 mL at 05/17/24 2230   sodium chloride  flush (NS) 0.9 % injection 10-40 mL  10-40 mL Intracatheter PRN Paudel, Keshab, MD       Facility-Administered Medications Ordered in Other Encounters  Medication Dose Route Frequency Provider Last Rate Last Admin   sodium chloride  flush (NS) 0.9 % injection 10 mL  10 mL Intravenous PRN Corcoran, Melissa C, MD   10 mL at 11/22/20 1314     Discharge Medications: Please see discharge summary for a list of discharge medications.  Relevant Imaging Results:  Relevant Lab Results:   Additional Information ssn [id number]  Alfonso Rummer, LCSW

## 2024-05-18 NOTE — Consult Note (Signed)
 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  Full thickness L great, 2nd and 3rd digits dry necrotic brown tissue r/t PAD  2.  Left lower leg  including medial lower leg full thickness ? R/t calciphylaxis versus venous insufficiency pink dry  3.  R lower leg anterior shin and medial lower leg full thickness r/t calciphylaxis; R shin 50% pink 50% tan R lower leg pink dry  4.  Unstageable Pressure injury sacrum and B buttocks 80% tan 20% red (3 separate areas)  Pressure Injury POA: Yes Measurement:see nursing flowsheet  Wound bed: Drainage (amount, consistency, odor)  Periwound:  Dressing procedure/placement/frequency:  Cleanse B lower leg wounds with Vashe Soila [phone]) do not rinse and allow to air dry. Apply Xeroform gauze ([place] [phone]) to all wound beds daily, cover with dry gauze and secure with silicone foams OR wrap with Kerlix roll gauze beginning right above toes and ending right below knees if patient can tolerate wrapping.  Apply ace bandage applied in same fashion as Kerlix for light compression.   Paint L great, 2nd and 3rd toe wounds with Betadine 2 times daily and leave open to air.  Cleanse sacrum and B buttocks wounds with Vashe, do not rinse and allow to air dry. Apply Medihoney to wound beds daily, cover with dry gauze and secure with silicone foam or ABD pad whichever is preferred.  POC discussed with bedside nurse. WOC team will not follow. Re-consult if further needs arise.   Thank you,    Powell Bar MSN, RN-BC, [employer]

## 2024-05-18 NOTE — Progress Notes (Incomplete)
 Wound care done to sacrum with the assist of [facility]. Pt wound care on BLE since they were done by [facility] on night shift this morning. Medihoney was applied to wounds on sacrum. Pt c/o of some discomfort when removing the drsg on. After drsg change pt was positioned on her right side to alleviate pressure on her sacrum. She states it's difficult for me to lay on a pillow or wedge or a long period of time.

## 2024-05-18 NOTE — Progress Notes (Signed)
  Received patient in bed to unit.   Informed consent signed and in chart.    TX duration: 3.5hrs     Transported back to floor  Hand-off given to patient's nurse. No acute distress noted pt c/o pain in legs, back   Access used: L HD catheter  Access issues: none   Total UF removed: 1.5L Medication(s) given: midodrine  Post HD VS: wnl Post HD weight: 55.0kg     Olivia Hurst LPN [facility]

## 2024-05-18 NOTE — Progress Notes (Addendum)
 PROGRESS NOTE    Connie Olsen  FMW:[id number] DOB: Oct 19, 1945 DOA: 05/16/2024 PCP: [facility]     Brief Narrative:   From admission h and p Connie Olsen is a pleasant 78 y.o. 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  Patient reports that she has been feeling weak for the last few days but which is not new to her.  She fell asleep in her wheelchair yesterday which she believes caused her to fall out of the wheelchair and hit her head.  She denies any loss of consciousness, not on anticoagulation for A-fib since she had a GI bleeding few months ago in May 2025.  Reports she has pain all over the body.  She also stated that she had completed dialysis yesterday, feels weak and dehydrated.  She stated that after dialysis she feels weak every time when she has dialysis.  She did not take any of her medications including midodrine  last night. She denies any fever, chills, nausea, vomiting, diarrhea, cough, shortness of breath or chest pain.   Assessment & Plan:   Principal Problem:   Fall at home, initial encounter Active Problems:   A-fib (HCC)   End stage renal disease (HCC)   Cancer of gallbladder (HCC)   Port-A-Cath in place   Chronic hypotension   Type 2 diabetes mellitus with other diabetic kidney complication (HCC)   Hyperlipidemia   Hyperphosphatemia   Other chronic pain   Ductal carcinoma in situ (DCIS) of left breast   PAD (peripheral artery disease) (HCC)   History of GI bleed  # Fall at home Hit head, some bruising also on hands, denies other injury. Ct head/neck negative - monitor  # Debility Recent extended rehab stay, now wheelchair-bound, PT advises snf. Patient desires this, willing to pay out-of-pocket - TOC consulted for SNF  # Hypokalemia # Hypomagnesemia With chronic loose  stools from IBD, last bm today. resolved - monitor  # ESRD  On mwf hemodialysis, appears stable - nephrology consulted for maintenance hemodialysis, will maintain that schedule here  # A-rib Rate controlled. Off anticoagulant since GI bleed earlier this year - home metop  # Sacral decubitus ulcers (poa) Stage 2 - wound care consulted, recs appreciated  # Chronic hypotension - home midodrine   # History gallbladder cancer, DCIS Both treated, now surveilled  # T2DM Euglycemic  # Hypocalcemia - per nephrology, will stop cinacalcet , start calcitriol  and calcium  supplements  # Chronic pain - home pregabalin , buprenorphine  patch  # PAD - home plavix   # Lower extremity ulcers Chronic - local wound care   DVT prophylaxis: heparin  Code Status: dnr/dni Family Communication: son Connie Olsen updated telephonically 9/15  Level of care: Telemetry Medical Status is: Observation    Consultants:  nephrology  Procedures: Hemodialysis pending  Antimicrobials:  none    Subjective: No complaints, tolerating diet, tolerated dialysis  Objective: Vitals:   05/18/24 1030 05/18/24 1100 05/18/24 1130 05/18/24 1147  BP: 95/65 (!) 78/60 (!) 84/73 111/85  Pulse: (!) 105 98 90 (!) 56  Resp: 17 16 15 18   Temp:    97.6 F (36.4 C)  TempSrc:    Oral  SpO2: 97% 99% 98% 96%  Weight:    55 kg  Height:        Intake/Output Summary (Last 24 hours) at 05/18/2024 1622 Last data filed at  05/18/2024 1147 Gross per 24 hour  Intake 680 ml  Output 1500 ml  Net -820 ml   Filed Weights   05/16/24 1254 05/18/24 0752 05/18/24 1147  Weight: 63.5 kg 57.2 kg 55 kg    Examination:  General exam: Appears calm and comfortable, chronically ill appearing Respiratory system: Clear to auscultation. Respiratory effort normal. Cardiovascular system: S1 & S2 heard, RRR.   Gastrointestinal system: Abdomen is nondistended, soft and nontender. N  Central nervous system: Alert and oriented. No focal  neurological deficits. Extremities: no edema Skin: ulcerations b/l shins, bruising on hands Psychiatry: Judgement and insight appear normal. Mood & affect appropriate.     Data Reviewed: I have personally reviewed following labs and imaging studies  CBC: Recent Labs  Lab 05/16/24 0748 05/17/24 0535 05/18/24 0800  WBC 10.5 8.6 8.4  NEUTROABS 8.7*  --   --   HGB 10.9* 10.4* 10.4*  HCT 35.1* 32.3* 33.3*  MCV 85.0 84.1 85.8  PLT 264 230 266   Basic Metabolic Panel: Recent Labs  Lab 05/16/24 0748 05/17/24 0535 05/18/24 0630  NA 144 143 142  K 2.5* 2.6* 4.4  CL 102 104 106  CO2 22 23 23   GLUCOSE 101* 99 81  BUN 13 22 35*  CREATININE 2.68* 4.00* 5.30*  CALCIUM  6.1* 5.8* 5.4*  MG 1.5* 2.1 1.9   GFR: Estimated Creatinine Clearance: 6.8 mL/min (A) (by C-G formula based on SCr of 5.3 mg/dL (H)). Liver Function Tests: Recent Labs  Lab 05/16/24 0748 05/17/24 0535  AST 33 30  ALT <5 <5  ALKPHOS 196* 210*  BILITOT 1.4* 1.3*  PROT 6.5 6.0*  ALBUMIN  3.0* 2.8*   No results for input(s): LIPASE, AMYLASE in the last 168 hours. No results for input(s): AMMONIA in the last 168 hours. Coagulation Profile: Recent Labs  Lab 05/17/24 0535  INR 1.2   Cardiac Enzymes: No results for input(s): CKTOTAL, CKMB, CKMBINDEX, TROPONINI in the last 168 hours. BNP (last 3 results) No results for input(s): PROBNP in the last 8760 hours. HbA1C: No results for input(s): HGBA1C in the last 72 hours. CBG: No results for input(s): GLUCAP in the last 168 hours. Lipid Profile: No results for input(s): CHOL, HDL, LDLCALC, TRIG, CHOLHDL, LDLDIRECT in the last 72 hours. Thyroid Function Tests: No results for input(s): TSH, T4TOTAL, FREET4, T3FREE, THYROIDAB in the last 72 hours. Anemia Panel: No results for input(s): VITAMINB12, FOLATE, FERRITIN, TIBC, IRON, RETICCTPCT in the last 72 hours. Urine analysis: No results found for:  COLORURINE, APPEARANCEUR, LABSPEC, PHURINE, GLUCOSEU, HGBUR, BILIRUBINUR, KETONESUR, PROTEINUR, UROBILINOGEN, NITRITE, LEUKOCYTESUR Sepsis Labs: @LABRCNTIP (procalcitonin:4,lacticidven:4)  )No results found for this or any previous visit (from the past 240 hours).       Radiology Studies: No results found.       Scheduled Meds:  buprenorphine   1 patch Transdermal Weekly   calcitRIOL   0.5 mcg Oral Daily   calcium  carbonate  1 tablet Oral BID WC   Chlorhexidine  Gluconate Cloth  6 each Topical Daily   clopidogrel   75 mg Oral Daily   feeding supplement  237 mL Oral BID BM   feeding supplement (NEPRO CARB STEADY)  237 mL Oral BID BM   heparin   5,000 Units Subcutaneous Q8H   leptospermum manuka honey  1 Application Topical Daily   metoprolol  tartrate  12.5 mg Oral BID   midodrine   10 mg Oral TID WC   midodrine   5 mg Oral Q M,W,F-HD   multivitamin  1 tablet Oral QHS  pantoprazole   40 mg Oral Daily   pregabalin   25 mg Oral TID   sodium chloride  flush  10-40 mL Intracatheter Q12H   Continuous Infusions:     LOS: 1 day     Devaughn KATHEE Ban, MD [facility]   If 7PM-7AM, please contact night-coverage [email or web address] Password [facility] 05/18/2024, 4:22 PM

## 2024-05-19 DIAGNOSIS — Y92009 Unspecified place in unspecified non-institutional (private) residence as the place of occurrence of the external cause: Secondary | ICD-10-CM | POA: Diagnosis not present

## 2024-05-19 DIAGNOSIS — E876 Hypokalemia: Secondary | ICD-10-CM | POA: Diagnosis not present

## 2024-05-19 DIAGNOSIS — W19XXXA Unspecified fall, initial encounter: Secondary | ICD-10-CM | POA: Diagnosis not present

## 2024-05-19 LAB — MAGNESIUM: Magnesium: 2 mg/dL (ref 1.7–2.4)

## 2024-05-19 LAB — BASIC METABOLIC PANEL WITH GFR
Anion gap: 14 (ref 5–15)
BUN: 27 mg/dL — ABNORMAL HIGH (ref 8–23)
CO2: 24 mmol/L (ref 22–32)
Calcium: 5.9 mg/dL — CL (ref 8.9–10.3)
Chloride: 98 mmol/L (ref 98–111)
Creatinine, Ser: 3.58 mg/dL — ABNORMAL HIGH (ref 0.44–1.00)
GFR, Estimated: 13 mL/min — ABNORMAL LOW (ref >60)
Glucose, Bld: 90 mg/dL (ref 70–99)
Potassium: 3.9 mmol/L (ref 3.5–5.1)
Sodium: 136 mmol/L (ref 135–145)

## 2024-05-19 LAB — LACTIC ACID, PLASMA: Lactic Acid, Venous: 1.6 mmol/L (ref 0.5–1.9)

## 2024-05-19 LAB — HEPATITIS B SURFACE ANTIBODY, QUANTITATIVE: Hep B S AB Quant (Post): 49.8 m[IU]/mL

## 2024-05-19 MED ORDER — SODIUM CHLORIDE 0.9 % IV BOLUS
250.0000 mL | Freq: Once | INTRAVENOUS | Status: AC
  Administered 2024-05-19: 250 mL via INTRAVENOUS

## 2024-05-19 MED ORDER — CALCIUM GLUCONATE-NACL 2-0.675 GM/100ML-% IV SOLN
2.0000 g | Freq: Once | INTRAVENOUS | Status: AC
  Administered 2024-05-19: 2000 mg via INTRAVENOUS
  Filled 2024-05-19: qty 100

## 2024-05-19 NOTE — Progress Notes (Signed)
 PROGRESS NOTE    Connie Olsen  FMW:[id number] DOB: 25-Apr-1946 DOA: 05/16/2024 PCP: [facility]     Brief Narrative:   From admission h and p Connie Olsen is a pleasant 78 y.o. 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  Patient reports that she has been feeling weak for the last few days but which is not new to her.  She fell asleep in her wheelchair yesterday which she believes caused her to fall out of the wheelchair and hit her head.  She denies any loss of consciousness, not on anticoagulation for A-fib since she had a GI bleeding few months ago in May 2025.  Reports she has pain all over the body.  She also stated that she had completed dialysis yesterday, feels weak and dehydrated.  She stated that after dialysis she feels weak every time when she has dialysis.  She did not take any of her medications including midodrine  last night. She denies any fever, chills, nausea, vomiting, diarrhea, cough, shortness of breath or chest pain.   Assessment & Plan:   Principal Problem:   Fall at home, initial encounter Active Problems:   A-fib (HCC)   End stage renal disease (HCC)   Cancer of gallbladder (HCC)   Port-A-Cath in place   Chronic hypotension   Type 2 diabetes mellitus with other diabetic kidney complication (HCC)   Hyperlipidemia   Hyperphosphatemia   Other chronic pain   Ductal carcinoma in situ (DCIS) of left breast   PAD (peripheral artery disease) (HCC)   History of GI bleed  # Fall at home Hit head, some bruising also on hands, denies other injury. Ct head/neck negative - monitor  # Debility Recent extended rehab stay, now wheelchair-bound, PT advises snf. Patient desires this, willing to pay out-of-pocket - TOC consulted for SNF  # Hypokalemia # Hypomagnesemia With chronic loose  stools from IBD, last bm today. resolved - monitor  # ESRD  On mwf hemodialysis, appears stable - nephrology consulted for maintenance hemodialysis, will maintain that schedule here  # A-rib Intermittent RVR but doesn't sustaine. Off anticoagulant since GI bleed earlier this year - home metop on hold 2/2 hypotension, may need alternate agent if hypotension persists  # Sacral decubitus ulcers (poa) Stage 2 - wound care consulted, recs appreciated  # Chronic hypotension Appears worse than usual. Asymptomatic, no vomiting/diarrhea - home midodrine  - check lactate and blood cultures - calcium  as below - small bolus of 250 ml today  # History gallbladder cancer, DCIS Both treated, now surveilled  # T2DM Euglycemic  # Hypocalcemia - per nephrology, will stop cinacalcet , start calcitriol  and calcium  supplements - given hypotension will give a dose of calcium  gluconate today  # Chronic pain - home pregabalin , buprenorphine  patch  # PAD - home plavix   # Lower extremity ulcers Chronic - local wound care   DVT prophylaxis: heparin  Code Status: dnr/dni Family Communication: son daniel updated telephonically 9/15  Level of care: Telemetry Medical Status is: inpatient    Consultants:  nephrology  Procedures: Hemodialysis pending  Antimicrobials:  none    Subjective: No complaints, feeling run down, no cough or fevers. Doesn't make urine. No diarrhea or vomiting  Objective: Vitals:   05/19/24 0512 05/19/24 0819 05/19/24 1100 05/19/24 1136  BP: (!) 80/70 (!) 85/63 (!) 80/46 (!) 74/31  Pulse:  89 (!) 102 72   Resp: 16 18 16    Temp: 99 F (37.2 C) 98.7 F (37.1 C) (!) 97.5 F (36.4 C)   TempSrc: Oral Oral Oral   SpO2: 97% 94% 96%   Weight:      Height:        Intake/Output Summary (Last 24 hours) at 05/19/2024 1308 Last data filed at 05/19/2024 1025 Gross per 24 hour  Intake 120 ml  Output --  Net 120 ml   Filed Weights   05/16/24 1254 05/18/24 0752  05/18/24 1147  Weight: 63.5 kg 57.2 kg 55 kg    Examination:  General exam: Appears calm and comfortable, chronically ill appearing Respiratory system: Clear to auscultation. Respiratory effort normal. Cardiovascular system: S1 & S2 heard, RRR.   Gastrointestinal system: Abdomen is nondistended, soft and nontender. N  Central nervous system: Alert and oriented. No focal neurological deficits. Extremities: no edema Skin: ulcerations b/l shins, bruising on hands Psychiatry: Judgement and insight appear normal. Mood & affect appropriate.     Data Reviewed: I have personally reviewed following labs and imaging studies  CBC: Recent Labs  Lab 05/16/24 0748 05/17/24 0535 05/18/24 0800  WBC 10.5 8.6 8.4  NEUTROABS 8.7*  --   --   HGB 10.9* 10.4* 10.4*  HCT 35.1* 32.3* 33.3*  MCV 85.0 84.1 85.8  PLT 264 230 266   Basic Metabolic Panel: Recent Labs  Lab 05/16/24 0748 05/17/24 0535 05/18/24 0630 05/19/24 0500  NA 144 143 142 136  K 2.5* 2.6* 4.4 3.9  CL 102 104 106 98  CO2 22 23 23 24   GLUCOSE 101* 99 81 90  BUN 13 22 35* 27*  CREATININE 2.68* 4.00* 5.30* 3.58*  CALCIUM  6.1* 5.8* 5.4* 5.9*  MG 1.5* 2.1 1.9 2.0   GFR: Estimated Creatinine Clearance: 10.1 mL/min (A) (by C-G formula based on SCr of 3.58 mg/dL (H)). Liver Function Tests: Recent Labs  Lab 05/16/24 0748 05/17/24 0535  AST 33 30  ALT <5 <5  ALKPHOS 196* 210*  BILITOT 1.4* 1.3*  PROT 6.5 6.0*  ALBUMIN  3.0* 2.8*   No results for input(s): LIPASE, AMYLASE in the last 168 hours. No results for input(s): AMMONIA in the last 168 hours. Coagulation Profile: Recent Labs  Lab 05/17/24 0535  INR 1.2   Cardiac Enzymes: No results for input(s): CKTOTAL, CKMB, CKMBINDEX, TROPONINI in the last 168 hours. BNP (last 3 results) No results for input(s): PROBNP in the last 8760 hours. HbA1C: No results for input(s): HGBA1C in the last 72 hours. CBG: No results for input(s): GLUCAP in the  last 168 hours. Lipid Profile: No results for input(s): CHOL, HDL, LDLCALC, TRIG, CHOLHDL, LDLDIRECT in the last 72 hours. Thyroid Function Tests: No results for input(s): TSH, T4TOTAL, FREET4, T3FREE, THYROIDAB in the last 72 hours. Anemia Panel: No results for input(s): VITAMINB12, FOLATE, FERRITIN, TIBC, IRON, RETICCTPCT in the last 72 hours. Urine analysis: No results found for: COLORURINE, APPEARANCEUR, LABSPEC, PHURINE, GLUCOSEU, HGBUR, BILIRUBINUR, KETONESUR, PROTEINUR, UROBILINOGEN, NITRITE, LEUKOCYTESUR Sepsis Labs: @LABRCNTIP (procalcitonin:4,lacticidven:4)  )No results found for this or any previous visit (from the past 240 hours).       Radiology Studies: No results found.       Scheduled Meds:  buprenorphine   1 patch Transdermal Weekly   calcitRIOL   0.5 mcg Oral Daily   calcium  carbonate  1 tablet Oral BID WC   Chlorhexidine  Gluconate Cloth  6 each Topical Daily   clopidogrel   75 mg Oral Daily  feeding supplement (NEPRO CARB STEADY)  237 mL Oral BID BM   heparin   5,000 Units Subcutaneous Q8H   leptospermum manuka honey  1 Application Topical Daily   midodrine   10 mg Oral TID WC   midodrine   5 mg Oral Q M,W,F-HD   multivitamin  1 tablet Oral QHS   pantoprazole   40 mg Oral Daily   pregabalin   25 mg Oral TID   sodium chloride  flush  10-40 mL Intracatheter Q12H   Continuous Infusions:  calcium  gluconate        LOS: 2 days     Devaughn KATHEE Ban, MD [facility]   If 7PM-7AM, please contact night-coverage [email or web address] Password [email or web address] 05/19/2024, 1:08 PM

## 2024-05-19 NOTE — Progress Notes (Signed)
 [facility]  ROUNDING NOTE   Subjective:   Patient seen laying in bed States she feel well today Room air   Objective:  Vital signs in last 24 hours:  Temp:  [97.6 F (36.4 C)-99.7 F (37.6 C)] 98.7 F (37.1 C) (09/16 0819) Pulse Rate:  [45-102] 102 (09/16 0819) Resp:  [15-18] 18 (09/16 0819) BP: (78-111)/(37-85) 85/63 (09/16 0819) SpO2:  [94 %-99 %] 94 % (09/16 0819) Weight:  [55 kg] 55 kg (09/15 1147)  Weight change:  Filed Weights   05/16/24 1254 05/18/24 0752 05/18/24 1147  Weight: 63.5 kg 57.2 kg 55 kg    Intake/Output: I/O last 3 completed shifts: In: -  Out: 1500 [Other:1500]   Intake/Output this shift:  Total I/O In: 120 [P.O.:120] Out: -   Physical Exam: General: NAD  Head: Normocephalic, atraumatic. Moist oral mucosal membranes  Eyes: Anicteric  Neck: Supple  Lungs:  Clear to auscultation, normal effort  Heart: Regular rate and rhythm  Abdomen:  Soft, nontender  Extremities:  Trace peripheral edema.  Neurologic: Awake, alert, conversant  Skin: BLE ulcerations, old blisters   Access: Lt Permcath    Basic Metabolic Panel: Recent Labs  Lab 05/16/24 0748 05/17/24 0535 05/18/24 0630 05/19/24 0500  NA 144 143 142 136  K 2.5* 2.6* 4.4 3.9  CL 102 104 106 98  CO2 22 23 23 24   GLUCOSE 101* 99 81 90  BUN 13 22 35* 27*  CREATININE 2.68* 4.00* 5.30* 3.58*  CALCIUM  6.1* 5.8* 5.4* 5.9*  MG 1.5* 2.1 1.9 2.0    Liver Function Tests: Recent Labs  Lab 05/16/24 0748 05/17/24 0535  AST 33 30  ALT <5 <5  ALKPHOS 196* 210*  BILITOT 1.4* 1.3*  PROT 6.5 6.0*  ALBUMIN  3.0* 2.8*   No results for input(s): LIPASE, AMYLASE in the last 168 hours. No results for input(s): AMMONIA in the last 168 hours.  CBC: Recent Labs  Lab 05/16/24 0748 05/17/24 0535 05/18/24 0800  WBC 10.5 8.6 8.4  NEUTROABS 8.7*  --   --   HGB 10.9* 10.4* 10.4*  HCT 35.1* 32.3* 33.3*  MCV 85.0 84.1 85.8  PLT 264 230 266    Cardiac Enzymes: No  results for input(s): CKTOTAL, CKMB, CKMBINDEX, TROPONINI in the last 168 hours.  BNP: Invalid input(s): POCBNP  CBG: No results for input(s): GLUCAP in the last 168 hours.  Microbiology: Results for orders placed or performed during the hospital encounter of 06/15/22  Resp Panel by RT-PCR (Flu A&B, Covid) Anterior Nasal Swab     Status: None   Collection Time: 06/15/22 11:46 AM   Specimen: Anterior Nasal Swab  Result Value Ref Range Status   SARS Coronavirus 2 by RT PCR NEGATIVE NEGATIVE Final    Comment: (NOTE) SARS-CoV-2 target nucleic acids are NOT DETECTED.  The SARS-CoV-2 RNA is generally detectable in upper respiratory specimens during the acute phase of infection. The lowest concentration of SARS-CoV-2 viral copies this assay can detect is 138 copies/mL. A negative result does not preclude SARS-Cov-2 infection and should not be used as the sole basis for treatment or other patient management decisions. A negative result may occur with  improper specimen collection/handling, submission of specimen other than nasopharyngeal swab, presence of viral mutation(s) within the areas targeted by this assay, and inadequate number of viral copies(<138 copies/mL). A negative result must be combined with clinical observations, patient history, and epidemiological information. The expected result is Negative.  Fact Sheet for Patients:  BloggerCourse.com  Fact Sheet for Healthcare Providers:  SeriousBroker.it  This test is no t yet approved or cleared by the [place]  FDA and  has been authorized for detection and/or diagnosis of SARS-CoV-2 by FDA under an Emergency Use Authorization (EUA). This EUA will remain  in effect (meaning this test can be used) for the duration of the COVID-19 declaration under Section 564(b)(1) of the Act, 21 U.S.C.section 360bbb-3(b)(1), unless the authorization is terminated  or revoked  sooner.       Influenza A by PCR NEGATIVE NEGATIVE Final   Influenza B by PCR NEGATIVE NEGATIVE Final    Comment: (NOTE) The Xpert Xpress SARS-CoV-2/FLU/RSV plus assay is intended as an aid in the diagnosis of influenza from Nasopharyngeal swab specimens and should not be used as a sole basis for treatment. Nasal washings and aspirates are unacceptable for Xpert Xpress SARS-CoV-2/FLU/RSV testing.  Fact Sheet for Patients: BloggerCourse.com  Fact Sheet for Healthcare Providers: SeriousBroker.it  This test is not yet approved or cleared by the [place]  FDA and has been authorized for detection and/or diagnosis of SARS-CoV-2 by FDA under an Emergency Use Authorization (EUA). This EUA will remain in effect (meaning this test can be used) for the duration of the COVID-19 declaration under Section 564(b)(1) of the Act, 21 U.S.C. section 360bbb-3(b)(1), unless the authorization is terminated or revoked.  Performed at [facility], [address] [place], [place] [address]     Coagulation Studies: Recent Labs    05/17/24 0535  LABPROT 15.7*  INR 1.2    Urinalysis: No results for input(s): COLORURINE, LABSPEC, PHURINE, GLUCOSEU, HGBUR, BILIRUBINUR, KETONESUR, PROTEINUR, UROBILINOGEN, NITRITE, LEUKOCYTESUR in the last 72 hours.  Invalid input(s): APPERANCEUR    Imaging: No results found.   Medications:     buprenorphine   1 patch Transdermal Weekly   calcitRIOL   0.5 mcg Oral Daily   calcium  carbonate  1 tablet Oral BID WC   Chlorhexidine  Gluconate Cloth  6 each Topical Daily   clopidogrel   75 mg Oral Daily   feeding supplement (NEPRO CARB STEADY)  237 mL Oral BID BM   heparin   5,000 Units Subcutaneous Q8H   leptospermum manuka honey  1 Application Topical Daily   metoprolol  tartrate  12.5 mg Oral BID   midodrine   10 mg Oral TID WC   midodrine   5 mg Oral Q M,W,F-HD    multivitamin  1 tablet Oral QHS   pantoprazole   40 mg Oral Daily   pregabalin   25 mg Oral TID   sodium chloride  flush  10-40 mL Intracatheter Q12H   acetaminophen  **OR** acetaminophen , ondansetron  **OR** ondansetron  (ZOFRAN ) IV, polyethylene glycol, sodium chloride  flush  Assessment/ Plan:  Connie Olsen is a 78 y.o.  female  with type 2 diabetes, HTN, HLD, afib, and end stage renal disease on hemodialysis, who was admitted to [facility] on 05/16/2024 for Hypocalcemia [E83.51] Hypokalemia [E87.6] Hypomagnesemia [E83.42] A-fib (HCC) [I48.91] Chronic atrial fibrillation (HCC) [I48.20] Generalized weakness [R53.1] Closed head injury, initial encounter [S09.90XA]   End-stage renal disease on hemodialysis. Last treatment completed on Friday. Patient receiving dialysis today, UF goal 1.5 L. Patient receives midodrine  5 mg as needed with outpatient dialysis. Will continue this order here. Next treatment scheduled for Wednesday.   2. Hypokalemia, potassium 2.5 on admission. Corrected with IV potassium. Also dialyzed on 3K bath during treatment.   3. Secondary Hyperparathyroidism: with outpatient labs: PTH 327, phosphorus 240, calcium  7.2   Lab Results  Component Value Date   PTH 370 (H)  11/26/2023   CALCIUM  5.9 (LL) 05/19/2024    Chronic hypocalcemia, corrected calcium  6.4. Likely secondary to cinacalcet . Will hold Cinacalcet  for now. Continue calcium  carbonate as prescribed.   4. Anemia of chronic kidney disease Lab Results  Component Value Date   HGB 10.4 (L) 05/18/2024    Hgb remains acceptable. No need for ESA.    LOS: 2 Clora Ohmer 9/16/202510:43 AM

## 2024-05-19 NOTE — TOC Progression Note (Addendum)
 Transition of Care [facility]) - Progression Note    Patient Details  Name: Connie Olsen MRN: [id number] Date of Birth: [birth year]  Transition of Care [facility] CM/SW Contact  Dalia GORMAN Fuse, RN Phone Number: 05/19/2024, 1:41 PM  Clinical Narrative:     The patient has a bed offer from [facility]. TOC sent secure message to MD to make him aware. [facility] was selected in the hub and TOC lvmm with Ricky to make hom aware.                    Expected Discharge Plan and Services                                               Social Drivers of Health (SDOH) Interventions SDOH Screenings   Food Insecurity: No Food Insecurity (05/16/2024)  Housing: Low Risk  (05/16/2024)  Transportation Needs: No Transportation Needs (05/16/2024)  Utilities: Not At Risk (05/16/2024)  Financial Resource Strain: Low Risk  (01/14/2024)   Received from [facility]  Social Connections: Unknown (05/16/2024)  Tobacco Use: Low Risk  (05/16/2024)    Readmission Risk Interventions     No data to display

## 2024-05-19 NOTE — Progress Notes (Signed)
 MD Wouk made aware that this Pt's HR has been going up to 120-140's at times, not sustaining. Due to Pt's low BP, she has not been able to take her scheduled Lopressor , and that Pt has been refusing Heparin  SQ.

## 2024-05-20 DIAGNOSIS — W19XXXA Unspecified fall, initial encounter: Secondary | ICD-10-CM | POA: Diagnosis not present

## 2024-05-20 DIAGNOSIS — Y92009 Unspecified place in unspecified non-institutional (private) residence as the place of occurrence of the external cause: Secondary | ICD-10-CM | POA: Diagnosis not present

## 2024-05-20 DIAGNOSIS — E876 Hypokalemia: Secondary | ICD-10-CM | POA: Diagnosis not present

## 2024-05-20 LAB — RENAL FUNCTION PANEL
Albumin: 2.4 g/dL — ABNORMAL LOW (ref 3.5–5.0)
Anion gap: 15 (ref 5–15)
BUN: 44 mg/dL — ABNORMAL HIGH (ref 8–23)
CO2: 24 mmol/L (ref 22–32)
Calcium: 6.3 mg/dL — CL (ref 8.9–10.3)
Chloride: 97 mmol/L — ABNORMAL LOW (ref 98–111)
Creatinine, Ser: 4.82 mg/dL — ABNORMAL HIGH (ref 0.44–1.00)
GFR, Estimated: 9 mL/min — ABNORMAL LOW (ref >60)
Glucose, Bld: 95 mg/dL (ref 70–99)
Phosphorus: 3.2 mg/dL (ref 2.5–4.6)
Potassium: 4.3 mmol/L (ref 3.5–5.1)
Sodium: 136 mmol/L (ref 135–145)

## 2024-05-20 LAB — CBC
HCT: 32.6 % — ABNORMAL LOW (ref 36.0–46.0)
Hemoglobin: 10.5 g/dL — ABNORMAL LOW (ref 12.0–15.0)
MCH: 28.2 pg (ref 26.0–34.0)
MCHC: 32.2 g/dL (ref 30.0–36.0)
MCV: 87.4 fL (ref 80.0–100.0)
Platelets: 193 K/uL (ref 150–400)
RBC: 3.73 MIL/uL — ABNORMAL LOW (ref 3.87–5.11)
RDW: 16.5 % — ABNORMAL HIGH (ref 11.5–15.5)
WBC: 6.3 K/uL (ref 4.0–10.5)
nRBC: 0 % (ref 0.0–0.2)

## 2024-05-20 LAB — MAGNESIUM: Magnesium: 2 mg/dL (ref 1.7–2.4)

## 2024-05-20 MED ORDER — ALBUMIN HUMAN 25 % IV SOLN
INTRAVENOUS | Status: AC
  Filled 2024-05-20: qty 100

## 2024-05-20 MED ORDER — ALBUMIN HUMAN 25 % IV SOLN
25.0000 g | Freq: Once | INTRAVENOUS | Status: AC
  Administered 2024-05-20: 25 g via INTRAVENOUS

## 2024-05-20 MED ORDER — ALTEPLASE 2 MG IJ SOLR: 2.0000 mg | Freq: Once | INTRAMUSCULAR | Status: DC | PRN

## 2024-05-20 MED ORDER — MIDODRINE HCL 5 MG PO TABS
ORAL_TABLET | ORAL | Status: AC
  Filled 2024-05-20: qty 1

## 2024-05-20 MED ORDER — MIDODRINE HCL 5 MG PO TABS
ORAL_TABLET | ORAL | Status: AC
  Filled 2024-05-20: qty 2

## 2024-05-20 MED ORDER — SENNA 8.6 MG PO TABS
1.0000 | ORAL_TABLET | Freq: Two times a day (BID) | ORAL | Status: DC
  Administered 2024-05-20 – 2024-05-21 (×2): 8.6 mg via ORAL
  Filled 2024-05-20 (×2): qty 1

## 2024-05-20 MED ORDER — HEPARIN SODIUM (PORCINE) 1000 UNIT/ML DIALYSIS: 1000.0000 [IU] | INTRAMUSCULAR | Status: DC | PRN

## 2024-05-20 MED ORDER — HEPARIN SODIUM (PORCINE) 1000 UNIT/ML IJ SOLN
INTRAMUSCULAR | Status: AC
  Filled 2024-05-20: qty 4

## 2024-05-20 NOTE — Progress Notes (Signed)
 PT Cancellation Note  Patient Details Name: Connie Olsen MRN: [id number] DOB: 1946/08/06   Cancelled Treatment:     Pt not available, off floor at dialysis. Will re-attempt next available date/time per POC.   [name] 05/20/2024, 3:52 PM

## 2024-05-20 NOTE — Progress Notes (Signed)
 PROGRESS NOTE    Connie Olsen  FMW:[id number] DOB: 21-Jan-1946 DOA: 05/16/2024 PCP: [facility]    Brief Narrative:   From admission h and p Connie Olsen is a pleasant 78 y.o. 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  Patient reports that she has been feeling weak for the last few days but which is not new to her.  She fell asleep in her wheelchair yesterday which she believes caused her to fall out of the wheelchair and hit her head.  She denies any loss of consciousness, not on anticoagulation for A-fib since she had a GI bleeding few months ago in May 2025.  Reports she has pain all over the body.  She also stated that she had completed dialysis yesterday, feels weak and dehydrated.  She stated that after dialysis she feels weak every time when she has dialysis.  She did not take any of her medications including midodrine  last night. She denies any fever, chills, nausea, vomiting, diarrhea, cough, shortness of breath or chest pain.   Assessment & Plan:   Principal Problem:   Fall at home, initial encounter Active Problems:   A-fib (HCC)   End stage renal disease (HCC)   Cancer of gallbladder (HCC)   Port-A-Cath in place   Chronic hypotension   Type 2 diabetes mellitus with other diabetic kidney complication (HCC)   Hyperlipidemia   Hyperphosphatemia   Other chronic pain   Ductal carcinoma in situ (DCIS) of left breast   PAD (peripheral artery disease) (HCC)   History of GI bleed # Fall at home Hit head, some bruising also on hands, denies other injury. Ct head/neck negative Will need skilled nursing facility   # Debility Recent extended rehab stay, now wheelchair-bound, PT advises snf. Patient desires this, willing to pay out-of-pocket - TOC consulted for SNF   # Hypokalemia #  Hypomagnesemia With chronic loose stools from IBD, last bm today. resolved - monitor   # ESRD  On mwf hemodialysis, appears stable - nephrology consulted for maintenance hemodialysis, will maintain that schedule here   # A-rib Intermittent RVR but doesn't sustain. Off anticoagulant since GI bleed earlier this year - home metop on hold 2/2 hypotension, may need alternate agent if hypotension persists   # Sacral decubitus ulcers (poa) Stage 2 - wound care consulted, recs appreciated   # Chronic hypotension Appears worse than usual. Asymptomatic, no vomiting/diarrhea - No evidence of infection.  Can continue home midodrine    # History gallbladder cancer, DCIS Both treated, now surveilled   # T2DM Euglycemic   # Hypocalcemia - per nephrology, will stop cinacalcet , start calcitriol  and calcium  supplements - Given 1 g calcium  gluconate during HD   # Chronic pain - home pregabalin , buprenorphine  patch   # PAD - home plavix    # Lower extremity ulcers Chronic - local wound care    DVT prophylaxis: SQH Code Status: DNR Family Communication: None today Disposition Plan: Status is: Inpatient Remains inpatient appropriate because: Symptomatic hypocalcemia.  Unsafe discharge plan.  Will need skilled nursing facility.   Level of care: Telemetry Medical  Consultants:  Nephrology  Procedures:  None  Antimicrobials: None   Subjective: Seen and examined.  Resting in bed.  No visible distress.  Reports pain from chronic sacral wounds.  Objective: Vitals:   05/20/24 1100 05/20/24 1130 05/20/24  1200 05/20/24 1229  BP: (!) 102/51 (!) 84/50 (!) 86/52 (!) 108/50  Pulse: (!) 58 84 72 80  Resp: 14 12 (!) 21 (!) 9  Temp:    97.7 F (36.5 C)  TempSrc:    Oral  SpO2: 97% 100% 96% 99%  Weight:      Height:        Intake/Output Summary (Last 24 hours) at 05/20/2024 1537 Last data filed at 05/20/2024 1229 Gross per 24 hour  Intake 460 ml  Output 1300 ml  Net -840 ml    Filed Weights   05/18/24 0752 05/18/24 1147 05/20/24 0756  Weight: 57.2 kg 55 kg 57.7 kg    Examination:  General exam: Appears calm and comfortable  Respiratory system: Clear to auscultation. Respiratory effort normal. Cardiovascular system: S1 and S2, RRR, no murmurs, no pedal edema Gastrointestinal system: Soft, NT/ND, normal bowel sounds Central nervous system: Alert and oriented. No focal neurological deficits. Extremities: Symmetric 5 x 5 power. Skin: Sacral wounds Psychiatry: Judgement and insight appear normal. Mood & affect appropriate.     Data Reviewed: I have personally reviewed following labs and imaging studies  CBC: Recent Labs  Lab 05/16/24 0748 05/17/24 0535 05/18/24 0800 05/20/24 0840  WBC 10.5 8.6 8.4 6.3  NEUTROABS 8.7*  --   --   --   HGB 10.9* 10.4* 10.4* 10.5*  HCT 35.1* 32.3* 33.3* 32.6*  MCV 85.0 84.1 85.8 87.4  PLT 264 230 266 193   Basic Metabolic Panel: Recent Labs  Lab 05/16/24 0748 05/17/24 0535 05/18/24 0630 05/19/24 0500 05/20/24 0840  NA 144 143 142 136 136  K 2.5* 2.6* 4.4 3.9 4.3  CL 102 104 106 98 97*  CO2 22 23 23 24 24   GLUCOSE 101* 99 81 90 95  BUN 13 22 35* 27* 44*  CREATININE 2.68* 4.00* 5.30* 3.58* 4.82*  CALCIUM  6.1* 5.8* 5.4* 5.9* 6.3*  MG 1.5* 2.1 1.9 2.0 2.0  PHOS  --   --   --   --  3.2   GFR: Estimated Creatinine Clearance: 7.7 mL/min (A) (by C-G formula based on SCr of 4.82 mg/dL (H)). Liver Function Tests: Recent Labs  Lab 05/16/24 0748 05/17/24 0535 05/20/24 0840  AST 33 30  --   ALT <5 <5  --   ALKPHOS 196* 210*  --   BILITOT 1.4* 1.3*  --   PROT 6.5 6.0*  --   ALBUMIN  3.0* 2.8* 2.4*   No results for input(s): LIPASE, AMYLASE in the last 168 hours. No results for input(s): AMMONIA in the last 168 hours. Coagulation Profile: Recent Labs  Lab 05/17/24 0535  INR 1.2   Cardiac Enzymes: No results for input(s): CKTOTAL, CKMB, CKMBINDEX, TROPONINI in the last 168 hours. BNP  (last 3 results) No results for input(s): PROBNP in the last 8760 hours. HbA1C: No results for input(s): HGBA1C in the last 72 hours. CBG: No results for input(s): GLUCAP in the last 168 hours. Lipid Profile: No results for input(s): CHOL, HDL, LDLCALC, TRIG, CHOLHDL, LDLDIRECT in the last 72 hours. Thyroid Function Tests: No results for input(s): TSH, T4TOTAL, FREET4, T3FREE, THYROIDAB in the last 72 hours. Anemia Panel: No results for input(s): VITAMINB12, FOLATE, FERRITIN, TIBC, IRON, RETICCTPCT in the last 72 hours. Sepsis Labs: Recent Labs  Lab 05/19/24 1330  LATICACIDVEN 1.6    Recent Results (from the past 240 hours)  Culture, blood (Routine X 2) w Reflex to ID Panel     Status: None (Preliminary  result)   Collection Time: 05/19/24  1:31 PM   Specimen: BLOOD  Result Value Ref Range Status   Specimen Description BLOOD BLOOD RIGHT HAND  Final   Special Requests   Final    BOTTLES DRAWN AEROBIC ONLY Blood Culture adequate volume   Culture   Final    NO GROWTH < 24 HOURS Performed at [facility], [address], [place], [place] [address]    Report Status PENDING  Incomplete  Culture, blood (Routine X 2) w Reflex to ID Panel     Status: None (Preliminary result)   Collection Time: 05/19/24  8:17 PM   Specimen: BLOOD  Result Value Ref Range Status   Specimen Description BLOOD BLOOD RIGHT HAND  Final   Special Requests   Final    BOTTLES DRAWN AEROBIC AND ANAEROBIC Blood Culture results may not be optimal due to an inadequate volume of blood received in culture bottles   Culture   Final    NO GROWTH < 12 HOURS Performed at [facility], [address] [place], [place] [address]    Report Status PENDING  Incomplete         Radiology Studies: No results found.      Scheduled Meds:  buprenorphine   1 patch Transdermal Weekly   calcitRIOL   0.5 mcg Oral Daily   calcium  carbonate  1 tablet Oral BID WC    Chlorhexidine  Gluconate Cloth  6 each Topical Daily   clopidogrel   75 mg Oral Daily   feeding supplement (NEPRO CARB STEADY)  237 mL Oral BID BM   heparin   5,000 Units Subcutaneous Q8H   leptospermum manuka honey  1 Application Topical Daily   midodrine   10 mg Oral TID WC   midodrine   5 mg Oral Q M,W,F-HD   multivitamin  1 tablet Oral QHS   pantoprazole   40 mg Oral Daily   pregabalin   25 mg Oral TID   sodium chloride  flush  10-40 mL Intracatheter Q12H   Continuous Infusions:   LOS: 3 days     Connie KATHEE Robson, MD [facility]   If 7PM-7AM, please contact night-coverage  05/20/2024, 3:37 PM

## 2024-05-20 NOTE — Progress Notes (Signed)
 [facility]  ROUNDING NOTE   Subjective:   Patient seen and evaluated during dialysis   HEMODIALYSIS FLOWSHEET:  Blood Flow Rate (mL/min): 349 mL/min Arterial Pressure (mmHg): -202.82 mmHg Venous Pressure (mmHg): 162.01 mmHg TMP (mmHg): -3.43 mmHg Ultrafiltration Rate (mL/min): 0 mL/min Dialysate Flow Rate (mL/min): 299 ml/min  Complains of knee pain  Objective:  Vital signs in last 24 hours:  Temp:  [97.5 F (36.4 C)-98.6 F (37 C)] 98.2 F (36.8 C) (09/17 0756) Pulse Rate:  [67-93] 86 (09/17 1030) Resp:  [11-28] 13 (09/17 1030) BP: (74-136)/(31-115) 96/61 (09/17 1030) SpO2:  [94 %-100 %] 96 % (09/17 1030) Weight:  [57.7 kg] 57.7 kg (09/17 0756)  Weight change:  Filed Weights   05/18/24 0752 05/18/24 1147 05/20/24 0756  Weight: 57.2 kg 55 kg 57.7 kg    Intake/Output: I/O last 3 completed shifts: In: 840.3 [P.O.:480; IV Piggyback:360.3] Out: -    Intake/Output this shift:  No intake/output data recorded.  Physical Exam: General: NAD  Head: Normocephalic, atraumatic. Moist oral mucosal membranes  Eyes: Anicteric  Lungs:  Clear to auscultation, normal effort  Heart: Regular rate and rhythm  Abdomen:  Soft, nontender  Extremities:  Trace peripheral edema.  Neurologic: Awake, alert, conversant  Skin: BLE ulcerations, old blisters   Access: Lt Permcath    Basic Metabolic Panel: Recent Labs  Lab 05/16/24 0748 05/17/24 0535 05/18/24 0630 05/19/24 0500 05/20/24 0840  NA 144 143 142 136 136  K 2.5* 2.6* 4.4 3.9 4.3  CL 102 104 106 98 97*  CO2 22 23 23 24 24   GLUCOSE 101* 99 81 90 95  BUN 13 22 35* 27* 44*  CREATININE 2.68* 4.00* 5.30* 3.58* 4.82*  CALCIUM  6.1* 5.8* 5.4* 5.9* 6.3*  MG 1.5* 2.1 1.9 2.0 2.0  PHOS  --   --   --   --  3.2    Liver Function Tests: Recent Labs  Lab 05/16/24 0748 05/17/24 0535 05/20/24 0840  AST 33 30  --   ALT <5 <5  --   ALKPHOS 196* 210*  --   BILITOT 1.4* 1.3*  --   PROT 6.5 6.0*  --   ALBUMIN   3.0* 2.8* 2.4*   No results for input(s): LIPASE, AMYLASE in the last 168 hours. No results for input(s): AMMONIA in the last 168 hours.  CBC: Recent Labs  Lab 05/16/24 0748 05/17/24 0535 05/18/24 0800 05/20/24 0840  WBC 10.5 8.6 8.4 6.3  NEUTROABS 8.7*  --   --   --   HGB 10.9* 10.4* 10.4* 10.5*  HCT 35.1* 32.3* 33.3* 32.6*  MCV 85.0 84.1 85.8 87.4  PLT 264 230 266 193    Cardiac Enzymes: No results for input(s): CKTOTAL, CKMB, CKMBINDEX, TROPONINI in the last 168 hours.  BNP: Invalid input(s): POCBNP  CBG: No results for input(s): GLUCAP in the last 168 hours.  Microbiology: Results for orders placed or performed during the hospital encounter of 05/16/24  Culture, blood (Routine X 2) w Reflex to ID Panel     Status: None (Preliminary result)   Collection Time: 05/19/24  1:31 PM   Specimen: BLOOD  Result Value Ref Range Status   Specimen Description BLOOD BLOOD RIGHT HAND  Final   Special Requests   Final    BOTTLES DRAWN AEROBIC ONLY Blood Culture adequate volume   Culture   Final    NO GROWTH < 24 HOURS Performed at [facility], [address], [place], [place] [address]  Report Status PENDING  Incomplete  Culture, blood (Routine X 2) w Reflex to ID Panel     Status: None (Preliminary result)   Collection Time: 05/19/24  8:17 PM   Specimen: BLOOD  Result Value Ref Range Status   Specimen Description BLOOD BLOOD RIGHT HAND  Final   Special Requests   Final    BOTTLES DRAWN AEROBIC AND ANAEROBIC Blood Culture results may not be optimal due to an inadequate volume of blood received in culture bottles   Culture   Final    NO GROWTH < 12 HOURS Performed at [facility], [address] [place], [place] [address]    Report Status PENDING  Incomplete    Coagulation Studies: No results for input(s): LABPROT, INR in the last 72 hours.   Urinalysis: No results for input(s): COLORURINE, LABSPEC, PHURINE,  GLUCOSEU, HGBUR, BILIRUBINUR, KETONESUR, PROTEINUR, UROBILINOGEN, NITRITE, LEUKOCYTESUR in the last 72 hours.  Invalid input(s): APPERANCEUR    Imaging: No results found.   Medications:     buprenorphine   1 patch Transdermal Weekly   calcitRIOL   0.5 mcg Oral Daily   calcium  carbonate  1 tablet Oral BID WC   Chlorhexidine  Gluconate Cloth  6 each Topical Daily   clopidogrel   75 mg Oral Daily   feeding supplement (NEPRO CARB STEADY)  237 mL Oral BID BM   heparin   5,000 Units Subcutaneous Q8H   leptospermum manuka honey  1 Application Topical Daily   midodrine   10 mg Oral TID WC   midodrine   5 mg Oral Q M,W,F-HD   multivitamin  1 tablet Oral QHS   pantoprazole   40 mg Oral Daily   pregabalin   25 mg Oral TID   sodium chloride  flush  10-40 mL Intracatheter Q12H   acetaminophen  **OR** acetaminophen , ondansetron  **OR** ondansetron  (ZOFRAN ) IV, polyethylene glycol, sodium chloride  flush  Assessment/ Plan:  Connie Olsen is a 78 y.o.  female  with type 2 diabetes, HTN, HLD, afib, and end stage renal disease on hemodialysis, who was admitted to [facility] on 05/16/2024 for Hypocalcemia [E83.51] Hypokalemia [E87.6] Hypomagnesemia [E83.42] A-fib (HCC) [I48.91] Chronic atrial fibrillation (HCC) [I48.20] Generalized weakness [R53.1] Closed head injury, initial encounter [S09.90XA]   End-stage renal disease on hemodialysis. Receiving dialysis today, UF reduced to 1L due to hypotension. Patient with very poor vascular system, likely untrue reading as patient has maintained her mentation and heart rate. Will order albumin  25g IV with dialysis. Next treatment scheduled for Friday.   2. Hypokalemia, potassium 2.5 on admission. Currently 4.3. Will continue to manage with dialysis  3. Secondary Hyperparathyroidism: with outpatient labs: PTH 327, phosphorus 240, calcium  7.2   Lab Results  Component Value Date   PTH 370 (H) 11/26/2023   CALCIUM  6.3 (LL) 05/20/2024   PHOS  3.2 05/20/2024    Chronic hypocalcemia, likely secondary to cinacalcet . Will hold Cinacalcet  for now. Continue calcium  carbonate as prescribed.  Calcium  slowly correcting  4. Anemia of chronic kidney disease Lab Results  Component Value Date   HGB 10.5 (L) 05/20/2024    Hgb remains acceptable. No need for ESA.    LOS: 3 Connie Olsen 9/17/202510:59 AM

## 2024-05-20 NOTE — Progress Notes (Signed)
 OT Cancellation Note  Patient Details Name: Connie Olsen MRN: [id number] DOB: 1945/11/16   Cancelled Treatment:    Reason Eval/Treat Not Completed: Patient at procedure or test/ unavailable. Pt at dialysis. Will re-attempt at later date/time as pt is available and medically appropriate.   Adahlia Stembridge R., MPH, MS, OTR/L ascom [phone] 05/20/24, 8:06 AM

## 2024-05-20 NOTE — Care Management Important Message (Signed)
 Important Message  Patient Details  Name: Connie Olsen MRN: [id number] Date of Birth: 01/15/1946   Important Message Given:  Yes - Medicare IM     Herrick Hartog W, CMA 05/20/2024, 11:48 AM

## 2024-05-20 NOTE — Progress Notes (Signed)
 Submitted pt information to [place] to assist with admission.    Connie Olsen Dialysis [email or web address] [email or web address]

## 2024-05-20 NOTE — Plan of Care (Signed)

## 2024-05-20 NOTE — Plan of Care (Signed)
   Problem: Health Behavior/Discharge Planning: Goal: Ability to manage health-related needs will improve Outcome: Progressing   Problem: Clinical Measurements: Goal: Will remain free from infection Outcome: Progressing   Problem: Nutrition: Goal: Adequate nutrition will be maintained Outcome: Progressing   Problem: Coping: Goal: Level of anxiety will decrease Outcome: Progressing

## 2024-05-20 NOTE — Progress Notes (Signed)
  Received patient in bed to unit.   Informed consent signed and in chart.    TX duration:3.30     Transported by  Hand-off given to patient's nurse.    Access used cath internal jugular  Access issues: none   Total UF removed: 1300 l Medication(s) given: Midodrine , Albumin  Post HD VS: wnl Post HD weight: 56.4 kg     N. Camyra Vaeth LPN [facility]

## 2024-05-21 ENCOUNTER — Inpatient Hospital Stay: Payer: Medicare Other | Attending: Internal Medicine

## 2024-05-21 DIAGNOSIS — W19XXXA Unspecified fall, initial encounter: Secondary | ICD-10-CM | POA: Diagnosis not present

## 2024-05-21 DIAGNOSIS — Y92009 Unspecified place in unspecified non-institutional (private) residence as the place of occurrence of the external cause: Secondary | ICD-10-CM | POA: Diagnosis not present

## 2024-05-21 DIAGNOSIS — E876 Hypokalemia: Secondary | ICD-10-CM | POA: Diagnosis not present

## 2024-05-21 LAB — BASIC METABOLIC PANEL WITH GFR
Anion gap: 9 (ref 5–15)
BUN: 25 mg/dL — ABNORMAL HIGH (ref 8–23)
CO2: 26 mmol/L (ref 22–32)
Calcium: 6.7 mg/dL — ABNORMAL LOW (ref 8.9–10.3)
Chloride: 99 mmol/L (ref 98–111)
Creatinine, Ser: 3.31 mg/dL — ABNORMAL HIGH (ref 0.44–1.00)
GFR, Estimated: 14 mL/min — ABNORMAL LOW (ref >60)
Glucose, Bld: 89 mg/dL (ref 70–99)
Potassium: 4 mmol/L (ref 3.5–5.1)
Sodium: 134 mmol/L — ABNORMAL LOW (ref 135–145)

## 2024-05-21 LAB — MAGNESIUM: Magnesium: 1.9 mg/dL (ref 1.7–2.4)

## 2024-05-21 MED ORDER — NEPRO/CARBSTEADY PO LIQD: 237.0000 mL | Freq: Two times a day (BID) | ORAL | Status: AC

## 2024-05-21 MED ORDER — CALCITRIOL 0.5 MCG PO CAPS: 0.5000 ug | ORAL_CAPSULE | Freq: Every day | ORAL | Status: AC

## 2024-05-21 MED ORDER — POLYETHYLENE GLYCOL 3350 17 G PO PACK: 17.0000 g | PACK | Freq: Every day | ORAL | Status: AC | PRN

## 2024-05-21 MED ORDER — CALCIUM CARBONATE 1250 (500 CA) MG PO TABS: 1.0000 | ORAL_TABLET | Freq: Two times a day (BID) | ORAL | Status: AC

## 2024-05-21 MED ORDER — SENNA 8.6 MG PO TABS: 1.0000 | ORAL_TABLET | Freq: Two times a day (BID) | ORAL | Status: AC

## 2024-05-21 MED ORDER — BUPRENORPHINE 10 MCG/HR TD PTWK: 1.0000 | MEDICATED_PATCH | TRANSDERMAL | 0 refills | Status: AC

## 2024-05-21 MED ORDER — HEPARIN SOD (PORK) LOCK FLUSH 100 UNIT/ML IV SOLN
500.0000 [IU] | Freq: Once | INTRAVENOUS | Status: AC
  Administered 2024-05-21: 500 [IU] via INTRAVENOUS
  Filled 2024-05-21: qty 5

## 2024-05-21 NOTE — TOC Transition Note (Signed)
 Transition of Care [facility]) - Discharge Note   Patient Details  Name: Connie Olsen MRN: [id number] Date of Birth: 01-09-1946  [facility] CM/SW Contact:  Dalia GORMAN Fuse, RN Phone Number: 05/21/2024, 10:25 AM   Clinical Narrative:     Patient is medically clear to [facility] to [facility]. TOC spoke with Daril at [facility] and the patient will receive dialysis tomorrow. MD spoke with the patient about her discharge. [facility] will transport the patient.   Nurse to call report to [facility] [phone] RM [phone]  Final next level of care: Skilled Nursing Facility Barriers to Discharge: Barriers Resolved   Patient Goals and CMS Choice            Discharge Placement              Patient chooses bed at:  ([facility]) Patient to be transferred to facility by: [facility] Name of family member notified: MD spoke with patient Patient and family notified of of transfer: 05/21/24  Discharge Plan and Services Additional resources added to the After Visit Summary for                                       Social Drivers of Health (SDOH) Interventions SDOH Screenings   Food Insecurity: No Food Insecurity (05/16/2024)  Housing: Low Risk  (05/16/2024)  Transportation Needs: No Transportation Needs (05/16/2024)  Utilities: Not At Risk (05/16/2024)  Financial Resource Strain: Low Risk  (01/14/2024)   Received from [facility]  Social Connections: Unknown (05/16/2024)  Tobacco Use: Low Risk  (05/16/2024)     Readmission Risk Interventions     No data to display

## 2024-05-21 NOTE — Discharge Summary (Signed)
 Physician Discharge Summary  Connie Olsen:[id number] DOB: 19-Dec-1945 DOA: 05/16/2024  PCP: [facility]  Admit date: 05/16/2024 Discharge date: 05/21/2024  Admitted From: Home Disposition:  SNF  Recommendations for Outpatient Follow-up:  Follow up with PCP in 1-2 weeks Follow up nephrology as directed  Home Health:No  Equipment/Devices:None   Discharge Condition:Stable  CODE STATUS:DNR  Diet recommendation: Renal  Brief/Interim Summary:  From admission h and p Connie Olsen is a pleasant 78 y.o. 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  Patient reports that she has been feeling weak for the last few days but which is not new to her.  She fell asleep in her wheelchair yesterday which she believes caused her to fall out of the wheelchair and hit her head.  She denies any loss of consciousness, not on anticoagulation for A-fib since she had a GI bleeding few months ago in May 2025.  Reports she has pain all over the body.  She also stated that she had completed dialysis yesterday, feels weak and dehydrated.  She stated that after dialysis she feels weak every time when she has dialysis.  She did not take any of her medications including midodrine  last night. She denies any fever, chills, nausea, vomiting, diarrhea, cough, shortness of breath or chest pain.    Discharge Diagnoses:  Principal Problem:   Fall at home, initial encounter Active Problems:   A-fib (HCC)   End stage renal disease (HCC)   Cancer of gallbladder (HCC)   Port-A-Cath in place   Chronic hypotension   Type 2 diabetes mellitus with other diabetic kidney complication (HCC)   Hyperlipidemia   Hyperphosphatemia   Other chronic pain   Ductal carcinoma in situ (DCIS) of left breast   PAD (peripheral artery disease) (HCC)   History  of GI bleed # Fall at home Hit head, some bruising also on hands, denies other injury. Ct head/neck negative Will need skilled nursing facility Accepted bed at [facility]   # Debility Recent extended rehab stay, now wheelchair-bound, PT advises snf. Patient desires this, willing to pay out-of-pocket - TOC consulted for SNF.  Patient excepted bed at [facility]   # Hypokalemia # Hypomagnesemia With chronic loose stools from IBD, last bm today. resolved - monitor   # ESRD  On mwf hemodialysis, appears stable - nephrology consulted for maintenance hemodialysis, will maintain that schedule here   # A-rib Intermittent RVR but doesn't sustain. Off anticoagulant since GI bleed earlier this year - home metop on hold 2/2 hypotension, will continue on discharge with holding parameters   # Sacral decubitus ulcers (poa) Stage 2 - wound care consulted, recs appreciated   # Chronic hypotension Appears worse than usual. Asymptomatic, no vomiting/diarrhea - No evidence of infection.  Can continue home midodrine    # History gallbladder cancer, DCIS Both treated, now surveilled   # T2DM Euglycemic   # Hypocalcemia - per nephrology, will stop cinacalcet , start calcitriol  and calcium  supplements - Reflected on discharge more   # Chronic pain - home pregabalin , buprenorphine  patch   # PAD - home plavix    # Lower extremity ulcers Chronic - local wound care   Discharge Instructions  Discharge Instructions     Amb referral to [facility]   Complete by: As directed    Diet - low sodium heart healthy  Complete by: As directed    Discharge wound care:   Complete by: As directed    Wound care  Daily      Comments: Cleanse sacrum and B buttocks wounds with Vashe, do not rinse and allow to air dry. Apply Medihoney to wound beds daily, cover with dry gauze and secure with silicone foam or ABD pad whichever is preferred  05/18/24 0714      05/18/24 0900    Wound care  Daily       Comments: Cleanse B lower leg wounds with Vashe Soila [phone]) do not rinse and allow to air dry. Apply Xeroform gauze (Lawson [phone]) to all wound beds daily, cover with dry gauze and secure with silicone foams OR wrap with Kerlix roll gauze beginning right above toes and ending right below knees if patient can tolerate wrapping.  Apply ace bandage applied in same fashion as Kerlix for light compression.  05/18/24 0713   05/18/24 0900    Wound care  2 times daily      Comments: Paint L great, 2nd and 3rd toe wounds with Betadine 2 times daily and leave open to air   Increase activity slowly   Complete by: As directed       Allergies as of 05/21/2024       Reactions   Aspirin  Other (See Comments)   Ulcerative colitis: UNABLE TO TOLERATE IF NOT ENTERIC COATED   Naltrexone Other (See Comments)   Librax  [chlordiazepoxide-clidinium] Anxiety   agitation   Povidone-iodine  Itching, Rash   Only problem if it is topical, tolerates injected contrast   Tape Itching        Medication List     PAUSE taking these medications    cinacalcet  90 MG tablet Wait to take this until your doctor or other care provider tells you to start again. Commonly known as: SENSIPAR  Take 90 mg by mouth See admin instructions. Take 90 mg by mouth daily with largest meal of the day.       STOP taking these medications    amoxicillin 500 MG capsule Commonly known as: AMOXIL   aspirin  EC 81 MG tablet   Benadryl  Allergy 25 mg capsule Generic drug: diphenhydrAMINE    ibuprofen  200 MG tablet Commonly known as: ADVIL    sodium chloride  (PF) 0.9 % SOLN 1 mL with cefTAZidime 1 mg/mL SOLN 1.25 mg, heparin  sodium (porcine) 1000 UNIT/ML SOLN 250 Units   vancomycin 750 MG Solr injection Commonly known as: VANCOCIN   Zinc Sulfate 220 (50 Zn) MG Tabs       TAKE these medications    acetaminophen  650 MG CR tablet Commonly known as: TYLENOL  Take 650 mg by mouth every 8 (eight) hours as needed for pain.    buprenorphine  10 MCG/HR Ptwk Commonly known as: BUTRANS  Place 1 patch onto the skin once a week. SNF use only What changed: additional instructions   calcitRIOL  0.5 MCG capsule Commonly known as: ROCALTROL  Take 1 capsule (0.5 mcg total) by mouth daily. Start taking on: May 22, 2024   calcium  carbonate 1250 (500 Ca) MG tablet Commonly known as: OS-CAL - dosed in mg of elemental calcium  Take 1 tablet (1,250 mg total) by mouth 2 (two) times daily with a meal.   clopidogrel  75 MG tablet Commonly known as: PLAVIX  TAKE 1 TABLET BY MOUTH EVERY DAY   DIALYVITE TABLET Tabs Take 1 tablet by mouth at bedtime.   feeding supplement (NEPRO CARB STEADY) Liqd Take 237 mLs by mouth 2 (  two) times daily between meals.   ferric citrate  1 GM 210 MG(Fe) tablet Commonly known as: AURYXIA Take 420 mg by mouth 3 (three) times daily with meals.   lidocaine  5 % Commonly known as: LIDODERM  Place 2 patches onto the skin daily.   lidocaine -prilocaine cream Commonly known as: EMLA Apply 1 application topically daily as needed (prior to port use).   metoprolol  tartrate 25 MG tablet Commonly known as: LOPRESSOR  Take 12.5 mg by mouth 2 (two) times daily as needed.   midodrine  10 MG tablet Commonly known as: PROAMATINE  Take 10 mg by mouth 3 (three) times daily.   naloxone 4 MG/0.1ML Liqd nasal spray kit Commonly known as: NARCAN Place into the nose.   pantoprazole  40 MG tablet Commonly known as: Protonix  Take 1 tablet (40 mg total) by mouth daily.   polyethylene glycol 17 g packet Commonly known as: MIRALAX  / GLYCOLAX  Take 17 g by mouth daily as needed for mild constipation.   pregabalin  25 MG capsule Commonly known as: LYRICA  Take 25 mg by mouth 3 (three) times daily.   senna 8.6 MG Tabs tablet Commonly known as: SENOKOT Take 1 tablet (8.6 mg total) by mouth 2 (two) times daily.               Discharge Care Instructions  (From admission, onward)           Start      Ordered   05/21/24 0000  Discharge wound care:       Comments: Wound care  Daily      Comments: Cleanse sacrum and B buttocks wounds with Vashe, do not rinse and allow to air dry. Apply Medihoney to wound beds daily, cover with dry gauze and secure with silicone foam or ABD pad whichever is preferred  05/18/24 0714      05/18/24 0900    Wound care  Daily      Comments: Cleanse B lower leg wounds with Vashe Soila [phone]) do not rinse and allow to air dry. Apply Xeroform gauze (Lawson [phone]) to all wound beds daily, cover with dry gauze and secure with silicone foams OR wrap with Kerlix roll gauze beginning right above toes and ending right below knees if patient can tolerate wrapping.  Apply ace bandage applied in same fashion as Kerlix for light compression.  05/18/24 0713   05/18/24 0900    Wound care  2 times daily      Comments: Paint L great, 2nd and 3rd toe wounds with Betadine 2 times daily and leave open to air   05/21/24 1013            Contact information for follow-up providers     [place] Follow up.   Why: hospital follow up Contact information: [address] [place] [address] [phone]              Contact information for after-discharge care     Destination     [facility] .   Service: Skilled Nursing Contact information: [address] [place]  [address] [phone]                    Allergies  Allergen Reactions   Aspirin  Other (See Comments)    Ulcerative colitis: UNABLE TO TOLERATE IF NOT ENTERIC COATED   Naltrexone Other (See Comments)   Librax  [Chlordiazepoxide-Clidinium] Anxiety    agitation   Povidone-Iodine  Itching and Rash  Only problem if it is topical, tolerates injected contrast   Tape Itching    Consultations: Nephrology   Procedures/Studies: CT Cervical Spine Wo Contrast Result Date: 05/16/2024 CLINICAL DATA:  78 year old female status post fall  from wheelchair, hypotension, dialysis patient. Headache. EXAM: CT CERVICAL SPINE WITHOUT CONTRAST TECHNIQUE: Multidetector CT imaging of the cervical spine was performed without intravenous contrast. Multiplanar CT image reconstructions were also generated. RADIATION DOSE REDUCTION: This exam was performed according to the departmental dose-optimization program which includes automated exposure control, adjustment of the mA and/or kV according to patient size and/or use of iterative reconstruction technique. COMPARISON:  Head CT today.  Cervical spine radiographs 06/16/2019. FINDINGS: Alignment: Straightening of cervical lordosis. Cervicothoracic junction alignment is within normal limits. Mild degenerative anterolisthesis of C4 on C5, C3 on C4. See additional details of those levels below. Maintained posterior element alignment. 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 Visualized skull base is intact. No atlanto-occipital dissociation. C1 and C2 are degenerated anteriorly, but appear intact and aligned. No acute osseous abnormality identified. Soft tissues and spinal canal: No prevertebral fluid or swelling. No visible canal hematoma. Advanced calcified atherosclerosis. Bilateral thoracic inlet vascular catheters. Coarsely calcified left thyroid nodule (no follow-up imaging recommended). Otherwise negative visible noncontrast neck soft tissues. Disc levels: Degenerative ankylosis of C3-C4 and C4-C5 facets. Degenerative Interbody ankylosis at C4-C5, C5-C6, and likely also C6-C7. Chronic disc, endplate, and facet degeneration at those levels. Disc and posterior element degeneration also at the unfused C2-C3 level. Mild spinal stenosis suspected there. Upper chest: Chest CT today reported separately. IMPRESSION: 1. No acute traumatic injury identified in the cervical spine. 2. 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 Mild  multifactorial spinal stenosis suspected at C2-C3. Electronically Signed   By: VEAR Hurst M.D.   On: 05/16/2024 09:50   CT CHEST ABDOMEN PELVIS WO CONTRAST Result Date: 05/16/2024 CLINICAL DATA:  78 year old female status post fall from wheelchair, hypotension, dialysis patient. Headache. EXAM: CT CHEST, ABDOMEN AND PELVIS WITHOUT CONTRAST TECHNIQUE: Multidetector CT imaging of the chest, abdomen and pelvis was performed following the standard protocol without IV contrast. RADIATION DOSE REDUCTION: This exam was performed according to the departmental dose-optimization program which includes automated exposure control, adjustment of the mA and/or kV according to patient size and/or use of iterative reconstruction technique. COMPARISON:  Noncontrast CT Abdomen and Pelvis 06/15/2022. FINDINGS: CT CHEST FINDINGS Cardiovascular: Left chest dual lumen dialysis type catheter. Right chest Port-A-Cath. Calcified aortic atherosclerosis. Calcified coronary artery atherosclerosis and/or stents. Mild cardiomegaly, mostly biatrial enlargement. No significant pericardial effusion; there is likely chronic pericardial fluid in a sub carina recess, partially visible in 2023 also. Vascular patency is not evaluated in the absence of IV contrast. Mediastinum/Nodes: No evidence of mediastinal hematoma, mass, lymphadenopathy on this noncontrast exam. Lungs/Pleura: Major airways are patent. Increased elevation of the right hemidiaphragm and confluent right lower lobe compressive atelectasis with mild air bronchograms since 2023. No superimposed pleural effusion. No pneumothorax. No pulmonary edema. Calcified subpleural left upper lobe granuloma on series 4, image 27 (no follow-up imaging recommended). Subcentimeter left lower lobe subpleural nodule also stable, series 4, image 21 previously (no follow-up imaging recommended). Scattered subpleural lung scarring otherwise. Musculoskeletal: Chronic severe degeneration at the shoulders,  greater on the left. 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 Hyperostosis in the thoracic spine with multilevel interbody ankylosis. Maintained thoracic vertebral height. Chronic and healing bilateral rib fractures, including left lateral ribs 5 through 7, posterior ribs 10 and 11 bilaterally. No acute rib  fracture is identified. CT ABDOMEN PELVIS FINDINGS Hepatobiliary: Stable and negative noncontrast liver. Diminutive or absent gallbladder. Pancreas: Partially atrophied. Spleen: Negative. Adrenals/Urinary Tract: Chronic native renal atrophy. Exophytic simple fluid density right upper pole cyst (no follow-up imaging recommended). Negative adrenal glands. Decompressed bladder. Stomach/Bowel: Nondilated large and small bowel loops. Descending and sigmoid colon diverticulosis. Less pronounced transverse colon diverticulosis. Redundant hepatic flexure. Small chronic fat containing ventral abdominal hernia is stable since 2023. Small volume retained fluid in the stomach and duodenum. No pneumoperitoneum, free fluid, or mesenteric inflammation identified. Vascular/Lymphatic: Severe Aortoiliac calcified atherosclerosis. Normal caliber abdominal aorta. Vascular patency is not evaluated in the absence of IV contrast. No lymphadenopathy identified. Reproductive: Small calcified uterine fibroids. Otherwise negative noncontrast appearance. Other: No pelvis free fluid. Musculoskeletal: Renal osteodystrophy. Moderate chronic levoconvex lumbar scoliosis. Stable vertebral height since 2023. Chronic but increased bulky subchondral cysts at the right hip joint. No acute fracture or dislocation identified. IMPRESSION: 1. No acute traumatic injury identified in the noncontrast chest, abdomen, or pelvis. 2. Chronic and healing bilateral rib fractures. Underlying renal osteodystrophy. 3. Increased right hemidiaphragm elevation and right lower lobe atelectasis since 2023. 4. Advanced calcified atherosclerosis. Aortic Atherosclerosis  (ICD10-I70.0). Electronically Signed   By: VEAR Hurst M.D.   On: 05/16/2024 08:51   CT Head Wo Contrast Result Date: 05/16/2024 CLINICAL DATA:  77 year old female status post fall from wheelchair, hypotension, dialysis patient. Headache. EXAM: CT HEAD WITHOUT CONTRAST TECHNIQUE: Contiguous axial images were obtained from the base of the skull through the vertex without intravenous contrast. RADIATION DOSE REDUCTION: This exam was performed according to the departmental dose-optimization program which includes automated exposure control, adjustment of the mA and/or kV according to patient size and/or use of iterative reconstruction technique. COMPARISON:  Head CT 06/15/2022. FINDINGS: Brain: Chronic encephalomalacia lateral left parietooccipital lobe junction, stable since 2023. Stable cerebral volume. No midline shift, ventriculomegaly, mass effect, evidence of mass lesion, intracranial hemorrhage or evidence of cortically based acute infarction. Maintained gray-white differentiation elsewhere. Vascular: Advanced Calcified atherosclerosis at the skull base. No suspicious intracranial vascular hyperdensity. Skull: Stable, intact. Sinuses/Orbits: Visualized paranasal sinuses and mastoids are stable and well aerated. Other: Calcified scalp vessel atherosclerosis. No acute orbit or scalp soft tissue injury identified. Postoperative changes to the right globe since 2023. IMPRESSION: 1. No acute intracranial abnormality or acute traumatic injury identified. 2. 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 3. Advanced calcified atherosclerosis. Electronically Signed   By: VEAR Hurst M.D.   On: 05/16/2024 08:40      Subjective: Seen and examined on the day of discharge.  He was.  Appropriate discharge to skilled nursing facility.  Discharge Exam: Vitals:   05/21/24 0348 05/21/24 0904  BP: (!) 96/43 (!) 84/27  Pulse: 72 87  Resp: 15 18  Temp: 98.1 F (36.7 C) 98 F (36.7 C)   SpO2: 96% 97%   Vitals:   05/20/24 2019 05/20/24 2352 05/21/24 0348 05/21/24 0904  BP: 95/62 (!) 94/43 (!) 96/43 (!) 84/27  Pulse: 81 85 72 87  Resp: 18 18 15 18   Temp: 98 F (36.7 C) 98.2 F (36.8 C) 98.1 F (36.7 C) 98 F (36.7 C)  TempSrc:   Oral   SpO2: 94% 100% 96% 97%  Weight:      Height:        General: Pt is alert, awake, not in acute distress Cardiovascular: RRR, S1/S2 +, no rubs, no gallops Respiratory: CTA bilaterally, no wheezing, no rhonchi Abdominal:  Soft, NT, ND, bowel sounds + Extremities: no edema, no cyanosis    The results of significant diagnostics from this hospitalization (including imaging, microbiology, ancillary and laboratory) are listed below for reference.     Microbiology: Recent Results (from the past 240 hours)  Culture, blood (Routine X 2) w Reflex to ID Panel     Status: None (Preliminary result)   Collection Time: 05/19/24  1:31 PM   Specimen: BLOOD  Result Value Ref Range Status   Specimen Description BLOOD BLOOD RIGHT HAND  Final   Special Requests   Final    BOTTLES DRAWN AEROBIC ONLY Blood Culture adequate volume   Culture   Final    NO GROWTH 2 DAYS Performed at [facility], [address], [place], [place] [address]    Report Status PENDING  Incomplete  Culture, blood (Routine X 2) w Reflex to ID Panel     Status: None (Preliminary result)   Collection Time: 05/19/24  8:17 PM   Specimen: BLOOD  Result Value Ref Range Status   Specimen Description BLOOD BLOOD RIGHT HAND  Final   Special Requests   Final    BOTTLES DRAWN AEROBIC AND ANAEROBIC Blood Culture results may not be optimal due to an inadequate volume of blood received in culture bottles   Culture   Final    NO GROWTH 2 DAYS Performed at [facility], [address] [place], [place] [address]    Report Status PENDING  Incomplete     Labs: BNP (last 3 results) No results for input(s): BNP in the last 8760 hours. Basic Metabolic  Panel: Recent Labs  Lab 05/17/24 0535 05/18/24 0630 05/19/24 0500 05/20/24 0840 05/21/24 0537  NA 143 142 136 136 134*  K 2.6* 4.4 3.9 4.3 4.0  CL 104 106 98 97* 99  CO2 23 23 24 24 26   GLUCOSE 99 81 90 95 89  BUN 22 35* 27* 44* 25*  CREATININE 4.00* 5.30* 3.58* 4.82* 3.31*  CALCIUM  5.8* 5.4* 5.9* 6.3* 6.7*  MG 2.1 1.9 2.0 2.0 1.9  PHOS  --   --   --  3.2  --    Liver Function Tests: Recent Labs  Lab 05/16/24 0748 05/17/24 0535 05/20/24 0840  AST 33 30  --   ALT <5 <5  --   ALKPHOS 196* 210*  --   BILITOT 1.4* 1.3*  --   PROT 6.5 6.0*  --   ALBUMIN  3.0* 2.8* 2.4*   No results for input(s): LIPASE, AMYLASE in the last 168 hours. No results for input(s): AMMONIA in the last 168 hours. CBC: Recent Labs  Lab 05/16/24 0748 05/17/24 0535 05/18/24 0800 05/20/24 0840  WBC 10.5 8.6 8.4 6.3  NEUTROABS 8.7*  --   --   --   HGB 10.9* 10.4* 10.4* 10.5*  HCT 35.1* 32.3* 33.3* 32.6*  MCV 85.0 84.1 85.8 87.4  PLT 264 230 266 193   Cardiac Enzymes: No results for input(s): CKTOTAL, CKMB, CKMBINDEX, TROPONINI in the last 168 hours. BNP: Invalid input(s): POCBNP CBG: No results for input(s): GLUCAP in the last 168 hours. D-Dimer No results for input(s): DDIMER in the last 72 hours. Hgb A1c No results for input(s): HGBA1C in the last 72 hours. Lipid Profile No results for input(s): CHOL, HDL, LDLCALC, TRIG, CHOLHDL, LDLDIRECT in the last 72 hours. Thyroid function studies No results for input(s): TSH, T4TOTAL, T3FREE, THYROIDAB in the last 72 hours.  Invalid input(s): FREET3 Anemia work up No  results for input(s): VITAMINB12, FOLATE, FERRITIN, TIBC, IRON, RETICCTPCT in the last 72 hours. Urinalysis No results found for: COLORURINE, APPEARANCEUR, LABSPEC, PHURINE, GLUCOSEU, HGBUR, BILIRUBINUR, KETONESUR, PROTEINUR, UROBILINOGEN, NITRITE, LEUKOCYTESUR Sepsis Labs Recent Labs  Lab  05/16/24 0748 05/17/24 0535 05/18/24 0800 05/20/24 0840  WBC 10.5 8.6 8.4 6.3   Microbiology Recent Results (from the past 240 hours)  Culture, blood (Routine X 2) w Reflex to ID Panel     Status: None (Preliminary result)   Collection Time: 05/19/24  1:31 PM   Specimen: BLOOD  Result Value Ref Range Status   Specimen Description BLOOD BLOOD RIGHT HAND  Final   Special Requests   Final    BOTTLES DRAWN AEROBIC ONLY Blood Culture adequate volume   Culture   Final    NO GROWTH 2 DAYS Performed at [facility], [address] [place], [place] [address]    Report Status PENDING  Incomplete  Culture, blood (Routine X 2) w Reflex to ID Panel     Status: None (Preliminary result)   Collection Time: 05/19/24  8:17 PM   Specimen: BLOOD  Result Value Ref Range Status   Specimen Description BLOOD BLOOD RIGHT HAND  Final   Special Requests   Final    BOTTLES DRAWN AEROBIC AND ANAEROBIC Blood Culture results may not be optimal due to an inadequate volume of blood received in culture bottles   Culture   Final    NO GROWTH 2 DAYS Performed at [facility], [address], [place], [place] [address]    Report Status PENDING  Incomplete     Time coordinating discharge: 40 minutes  SIGNED:   Calvin KATHEE Robson, MD  [facility] 05/21/2024, 10:13 AM Pager   If 7PM-7AM, please contact night-coverage

## 2024-05-21 NOTE — Plan of Care (Signed)
  Problem: Health Behavior/Discharge Planning: Goal: Ability to manage health-related needs will improve Outcome: Progressing   Problem: Clinical Measurements: Goal: Will remain free from infection Outcome: Progressing Goal: Cardiovascular complication will be avoided Outcome: Progressing   Problem: Coping: Goal: Level of anxiety will decrease Outcome: Progressing

## 2024-05-21 NOTE — Progress Notes (Signed)
 [facility]  ROUNDING NOTE   Subjective:   Patient seen laying in bed Alert and oriented States she feels disoriented, answers all questions appropriately   Objective:  Vital signs in last 24 hours:  Temp:  [97.7 F (36.5 C)-98.2 F (36.8 C)] 98 F (36.7 C) (09/18 0904) Pulse Rate:  [72-87] 87 (09/18 0904) Resp:  [9-21] 18 (09/18 0904) BP: (84-108)/(27-62) 84/27 (09/18 0904) SpO2:  [94 %-100 %] 97 % (09/18 0904) Weight:  [56.4 kg] 56.4 kg (09/17 1229)  Weight change:  Filed Weights   05/18/24 1147 05/20/24 0756 05/20/24 1229  Weight: 55 kg 57.7 kg 56.4 kg    Intake/Output: I/O last 3 completed shifts: In: 120 [P.O.:120] Out: 1300 [Other:1300]   Intake/Output this shift:  No intake/output data recorded.  Physical Exam: General: NAD  Head: Normocephalic, atraumatic. Moist oral mucosal membranes  Eyes: Anicteric  Lungs:  Clear to auscultation, normal effort  Heart: Regular rate and rhythm  Abdomen:  Soft, nontender  Extremities:  Trace peripheral edema.  Neurologic: Awake, alert, conversant  Skin: BLE ulcerations, old blisters   Access: Lt Permcath    Basic Metabolic Panel: Recent Labs  Lab 05/17/24 0535 05/18/24 0630 05/19/24 0500 05/20/24 0840 05/21/24 0537  NA 143 142 136 136 134*  K 2.6* 4.4 3.9 4.3 4.0  CL 104 106 98 97* 99  CO2 23 23 24 24 26   GLUCOSE 99 81 90 95 89  BUN 22 35* 27* 44* 25*  CREATININE 4.00* 5.30* 3.58* 4.82* 3.31*  CALCIUM  5.8* 5.4* 5.9* 6.3* 6.7*  MG 2.1 1.9 2.0 2.0 1.9  PHOS  --   --   --  3.2  --     Liver Function Tests: Recent Labs  Lab 05/16/24 0748 05/17/24 0535 05/20/24 0840  AST 33 30  --   ALT <5 <5  --   ALKPHOS 196* 210*  --   BILITOT 1.4* 1.3*  --   PROT 6.5 6.0*  --   ALBUMIN  3.0* 2.8* 2.4*   No results for input(s): LIPASE, AMYLASE in the last 168 hours. No results for input(s): AMMONIA in the last 168 hours.  CBC: Recent Labs  Lab 05/16/24 0748 05/17/24 0535 05/18/24 0800  05/20/24 0840  WBC 10.5 8.6 8.4 6.3  NEUTROABS 8.7*  --   --   --   HGB 10.9* 10.4* 10.4* 10.5*  HCT 35.1* 32.3* 33.3* 32.6*  MCV 85.0 84.1 85.8 87.4  PLT 264 230 266 193    Cardiac Enzymes: No results for input(s): CKTOTAL, CKMB, CKMBINDEX, TROPONINI in the last 168 hours.  BNP: Invalid input(s): POCBNP  CBG: No results for input(s): GLUCAP in the last 168 hours.  Microbiology: Results for orders placed or performed during the hospital encounter of 05/16/24  Culture, blood (Routine X 2) w Reflex to ID Panel     Status: None (Preliminary result)   Collection Time: 05/19/24  1:31 PM   Specimen: BLOOD  Result Value Ref Range Status   Specimen Description BLOOD BLOOD RIGHT HAND  Final   Special Requests   Final    BOTTLES DRAWN AEROBIC ONLY Blood Culture adequate volume   Culture   Final    NO GROWTH 2 DAYS Performed at [facility], [address], [place], [place] [address]    Report Status PENDING  Incomplete  Culture, blood (Routine X 2) w Reflex to ID Panel     Status: None (Preliminary result)   Collection Time: 05/19/24  8:17 PM  Specimen: BLOOD  Result Value Ref Range Status   Specimen Description BLOOD BLOOD RIGHT HAND  Final   Special Requests   Final    BOTTLES DRAWN AEROBIC AND ANAEROBIC Blood Culture results may not be optimal due to an inadequate volume of blood received in culture bottles   Culture   Final    NO GROWTH 2 DAYS Performed at [facility], [address] [place], [place] [address]    Report Status PENDING  Incomplete    Coagulation Studies: No results for input(s): LABPROT, INR in the last 72 hours.   Urinalysis: No results for input(s): COLORURINE, LABSPEC, PHURINE, GLUCOSEU, HGBUR, BILIRUBINUR, KETONESUR, PROTEINUR, UROBILINOGEN, NITRITE, LEUKOCYTESUR in the last 72 hours.  Invalid input(s): APPERANCEUR    Imaging: No results found.   Medications:     buprenorphine    1 patch Transdermal Weekly   calcitRIOL   0.5 mcg Oral Daily   calcium  carbonate  1 tablet Oral BID WC   Chlorhexidine  Gluconate Cloth  6 each Topical Daily   clopidogrel   75 mg Oral Daily   feeding supplement (NEPRO CARB STEADY)  237 mL Oral BID BM   heparin   5,000 Units Subcutaneous Q8H   leptospermum manuka honey  1 Application Topical Daily   midodrine   10 mg Oral TID WC   midodrine   5 mg Oral Q M,W,F-HD   multivitamin  1 tablet Oral QHS   pantoprazole   40 mg Oral Daily   pregabalin   25 mg Oral TID   senna  1 tablet Oral BID   sodium chloride  flush  10-40 mL Intracatheter Q12H   acetaminophen  **OR** acetaminophen , ondansetron  **OR** ondansetron  (ZOFRAN ) IV, polyethylene glycol, sodium chloride  flush  Assessment/ Plan:  [name] is a 78 y.o.  female  with type 2 diabetes, HTN, HLD, afib, and end stage renal disease on hemodialysis, who was admitted to [facility] on 05/16/2024 for Hypocalcemia [E83.51] Hypokalemia [E87.6] Hypomagnesemia [E83.42] A-fib (HCC) [I48.91] Chronic atrial fibrillation (HCC) [I48.20] Generalized weakness [R53.1] Closed head injury, initial encounter [S09.90XA]   End-stage renal disease on hemodialysis. Received dialysis yesterday with UF1.3L achieved.  Next treatment scheduled for Friday.   2. Hypokalemia, potassium 2.5 on admission. Potassium 4.0. Will continue to manage with dialysis  3. 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 Will hold Cinacalcet  at discharge and continue calcitriol . Continue calcium  carbonate as prescribed.    4. Anemia of chronic kidney disease Lab Results  Component Value Date   HGB 10.5 (L) 05/20/2024    Hgb acceptable. No need for ESA.    LOS: 4 Connie Olsen 9/18/202511:17 AM

## 2024-05-21 NOTE — TOC Progression Note (Signed)
 Transition of Care [facility]) - Progression Note    Patient Details  Name: Connie Olsen MRN: [id number] Date of Birth: [birth year]  Transition of [facility] CM/SW Contact  Dalia GORMAN Fuse, RN Phone Number: 05/21/2024, 8:55 AM  Clinical Narrative:     Received message from [place] at [place], they can accept the patient today. TOC spoke with Sonny at [place], and confirmed they have a bed for the patient today. TOC made the hospitolist aware.                    Expected Discharge Plan and Services                                               Social Drivers of Health (SDOH) Interventions SDOH Screenings   Food Insecurity: No Food Insecurity (05/16/2024)  Housing: Low Risk  (05/16/2024)  Transportation Needs: No Transportation Needs (05/16/2024)  Utilities: Not At Risk (05/16/2024)  Financial Resource Strain: Low Risk  (01/14/2024)   Received from [facility]  Social Connections: Unknown (05/16/2024)  Tobacco Use: Low Risk  (05/16/2024)    Readmission Risk Interventions     No data to display

## 2024-05-21 NOTE — Progress Notes (Signed)
 Report called to [facility] [phone], given to [facility].

## 2024-05-24 LAB — CULTURE, BLOOD (ROUTINE X 2)
Culture: NO GROWTH
Culture: NO GROWTH
Special Requests: ADEQUATE

## 2024-06-04 ENCOUNTER — Encounter (INDEPENDENT_AMBULATORY_CARE_PROVIDER_SITE_OTHER)

## 2024-06-04 ENCOUNTER — Ambulatory Visit (INDEPENDENT_AMBULATORY_CARE_PROVIDER_SITE_OTHER): Admitting: Nurse Practitioner

## 2024-06-09 ENCOUNTER — Inpatient Hospital Stay: Attending: Internal Medicine

## 2024-07-14 ENCOUNTER — Ambulatory Visit: Payer: Medicare Other | Admitting: Internal Medicine

## 2024-07-28 ENCOUNTER — Inpatient Hospital Stay: Attending: Internal Medicine

## 2024-09-15 ENCOUNTER — Inpatient Hospital Stay

## 2024-11-24 ENCOUNTER — Encounter

## 2024-11-24 ENCOUNTER — Ambulatory Visit: Admitting: Internal Medicine

## 2024-11-25 ENCOUNTER — Encounter

## 2024-11-25 ENCOUNTER — Ambulatory Visit: Admitting: Internal Medicine
